# Patient Record
Sex: Female | Born: 1980 | Hispanic: No | Marital: Married | Smoking: Former smoker
Health system: Southern US, Community
[De-identification: ages and names within clinical notes are randomized; demographics above are authoritative.]

## PROBLEM LIST (undated history)

## (undated) DIAGNOSIS — G43909 Migraine, unspecified, not intractable, without status migrainosus: Secondary | ICD-10-CM

## (undated) DIAGNOSIS — F419 Anxiety disorder, unspecified: Secondary | ICD-10-CM

## (undated) DIAGNOSIS — R000 Tachycardia, unspecified: Secondary | ICD-10-CM

## (undated) DIAGNOSIS — I10 Essential (primary) hypertension: Secondary | ICD-10-CM

## (undated) DIAGNOSIS — G629 Polyneuropathy, unspecified: Secondary | ICD-10-CM

## (undated) DIAGNOSIS — E1140 Type 2 diabetes mellitus with diabetic neuropathy, unspecified: Secondary | ICD-10-CM

## (undated) DIAGNOSIS — E119 Type 2 diabetes mellitus without complications: Secondary | ICD-10-CM

## (undated) DIAGNOSIS — K219 Gastro-esophageal reflux disease without esophagitis: Secondary | ICD-10-CM

## (undated) DIAGNOSIS — D649 Anemia, unspecified: Secondary | ICD-10-CM

## (undated) DIAGNOSIS — K8020 Calculus of gallbladder without cholecystitis without obstruction: Secondary | ICD-10-CM

## (undated) HISTORY — DX: Type 2 diabetes mellitus with diabetic neuropathy, unspecified: E11.40

## (undated) HISTORY — PX: NO PAST SURGERIES: SHX2092

---
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
## 2012-08-23 DIAGNOSIS — E119 Type 2 diabetes mellitus without complications: Secondary | ICD-10-CM

## 2012-08-23 HISTORY — DX: Type 2 diabetes mellitus without complications: E11.9

## 2015-12-28 ENCOUNTER — Emergency Department (HOSPITAL_COMMUNITY)
Admission: EM | Admit: 2015-12-28 | Discharge: 2015-12-28 | Disposition: A | Discharge status: Home / Self Care | Payer: Self-pay | Attending: Emergency Medicine | Admitting: Emergency Medicine

## 2015-12-28 ENCOUNTER — Encounter (HOSPITAL_COMMUNITY): Payer: Self-pay | Admitting: *Deleted

## 2015-12-28 DIAGNOSIS — R0981 Nasal congestion: Secondary | ICD-10-CM

## 2015-12-28 DIAGNOSIS — J029 Acute pharyngitis, unspecified: Secondary | ICD-10-CM | POA: Insufficient documentation

## 2015-12-28 DIAGNOSIS — R51 Headache: Secondary | ICD-10-CM | POA: Insufficient documentation

## 2015-12-28 DIAGNOSIS — R509 Fever, unspecified: Secondary | ICD-10-CM | POA: Insufficient documentation

## 2015-12-28 DIAGNOSIS — H748X1 Other specified disorders of right middle ear and mastoid: Secondary | ICD-10-CM | POA: Insufficient documentation

## 2015-12-28 DIAGNOSIS — R110 Nausea: Secondary | ICD-10-CM | POA: Insufficient documentation

## 2015-12-28 DIAGNOSIS — H9201 Otalgia, right ear: Secondary | ICD-10-CM | POA: Insufficient documentation

## 2015-12-28 LAB — RAPID STREP SCREEN (MED CTR MEBANE ONLY): STREPTOCOCCUS, GROUP A SCREEN (DIRECT): NEGATIVE

## 2015-12-28 MED ORDER — OXYMETAZOLINE HCL 0.05 % NA SOLN: 1.0000 | Freq: Two times a day (BID) | NASAL | Status: DC

## 2015-12-28 MED ORDER — HYDROCODONE-ACETAMINOPHEN 7.5-325 MG/15ML PO SOLN
15.0000 mL | Freq: Three times a day (TID) | ORAL | Status: DC | PRN
Start: 2015-12-28 — End: 2019-03-13

## 2015-12-28 MED ORDER — ACETAMINOPHEN 500 MG PO TABS
1000.0000 mg | ORAL_TABLET | Freq: Once | ORAL | Status: AC
  Administered 2015-12-28: 1000 mg via ORAL
  Filled 2015-12-28: qty 2

## 2015-12-28 MED ORDER — ACYCLOVIR 400 MG PO TABS: 400.0000 mg | ORAL_TABLET | Freq: Every day | ORAL | Status: DC

## 2015-12-28 NOTE — ED Provider Notes (Signed)
CSN: [id number]     Arrival date & time 12/28/15  [id number] History  By signing my name below, I, Tanda RockersMargaux Venter, attest that this documentation has been prepared under the direction and in the presence of Melburn HakeNicole Aleric Froelich, PA-C. Electronically Signed: Tanda RockersMargaux Venter, ED Scribe. 12/28/2015. 9:20 AM.   Chief Complaint  Patient presents with  . Otalgia  . Sore Throat   The history is provided by the patient. No language interpreter was used.     HPI Comments: Maria Morris is a 35 y.o. female who presents to the Emergency Department complaining of gradual onset, constant, sharp, right ear pain x 3 days, worsening today. No recent trauma to ear. Pt also complains of a right sided sore throat, watery eyes, subjective fever, intermittent nausea (onset this morning), rhinorrhea, and a mild headache. She has pain with swallowing but is able to tolerate her own secretions. Pt has been taking Sudafed and Advil with mild relief. Her last dose of Advil was last night. No recent sick contact with similar symptoms. Denies visual changes, eye drainage, nasal congestion, drooling, facial swelling, shortness of breath, cough, wheezing, vomiting, abdominal pain, hearing loss, ear drainage, chest pain, rash or any other associated symptoms.  History reviewed. No pertinent past medical history. History reviewed. No pertinent past surgical history. History reviewed. No pertinent family history. Social History  Substance Use Topics  . Smoking status: Never Smoker   . Smokeless tobacco: Never Used  . Alcohol Use: Yes   OB History    No data available     Review of Systems  Constitutional: Positive for fever (subjective).  HENT: Positive for ear pain (right), rhinorrhea and sore throat. Negative for congestion, drooling, ear discharge, facial swelling, hearing loss and trouble swallowing.   Eyes: Negative for discharge (watering).  Respiratory: Negative for cough, shortness of breath and wheezing.    Cardiovascular: Negative for chest pain.  Gastrointestinal: Positive for nausea. Negative for vomiting and abdominal pain.  Neurological: Positive for headaches.   Allergies  Review of patient's allergies indicates no known allergies.  Home Medications   Prior to Admission medications   Medication Sig Start Date End Date Taking? Authorizing Provider  acyclovir (ZOVIRAX) 400 MG tablet Take 1 tablet (400 mg total) by mouth 5 (five) times daily. 12/28/15   Barrett HenleNicole Elizabeth Poseidon Pam, PA-C  HYDROcodone-acetaminophen (HYCET) 7.5-325 mg/15 ml solution Take 15 mLs by mouth every 8 (eight) hours as needed for moderate pain. 12/28/15   Barrett HenleNicole Elizabeth Keelyn Monjaras, PA-C  oxymetazoline (AFRIN NASAL SPRAY) 0.05 % nasal spray Place 1 spray into both nostrils 2 (two) times daily. 12/28/15   Satira SarkNicole Elizabeth Rhyse Skowron, PA-C   BP 147/95 mmHg  Pulse 89  Temp(Src) 98.8 F (37.1 C) (Oral)  Resp 16  SpO2 100%  LMP 12/27/2009   Physical Exam  Constitutional: She is oriented to person, place, and time. She appears well-developed and well-nourished.  HENT:  Head: Normocephalic and atraumatic.  Right Ear: Hearing, external ear and ear canal normal. No mastoid tenderness. Tympanic membrane is not erythematous. A middle ear effusion is present.  Left Ear: Hearing, tympanic membrane, external ear and ear canal normal. No mastoid tenderness.  Nose: Rhinorrhea present. Right sinus exhibits no maxillary sinus tenderness and no frontal sinus tenderness. Left sinus exhibits no maxillary sinus tenderness and no frontal sinus tenderness.  Mouth/Throat: Uvula is midline and mucous membranes are normal. No trismus in the jaw. No dental abscesses or uvula swelling. Oropharyngeal exudate (white) and posterior oropharyngeal erythema present. No posterior  oropharyngeal edema or tonsillar abscesses.  Multiple erosions and vesicular ulcerated lesions noted to posterior oropharynx and right lateral tongue.  Eyes: Conjunctivae and EOM are  normal. Pupils are equal, round, and reactive to light. Right eye exhibits no discharge. Left eye exhibits no discharge. No scleral icterus.  Neck: Normal range of motion. Neck supple.  Cardiovascular: Normal rate, regular rhythm, normal heart sounds and intact distal pulses.   Pulmonary/Chest: Effort normal and breath sounds normal. No respiratory distress. She has no wheezes. She has no rales.  Abdominal: Soft. Bowel sounds are normal. There is no tenderness.  Musculoskeletal: She exhibits no edema.  Lymphadenopathy:    She has cervical adenopathy (right submandibular).  Neurological: She is alert and oriented to person, place, and time.  Skin: Skin is warm and dry.  Nursing note and vitals reviewed.   ED Course  Procedures (including critical care time)  DIAGNOSTIC STUDIES: Oxygen Saturation is 98% on RA, normal by my interpretation.    COORDINATION OF CARE: 9:06 AM-Discussed treatment plan which includes rapid strep test with pt at bedside and pt agreed to plan.   Labs Review Labs Reviewed  RAPID STREP SCREEN (NOT AT [facility])  CULTURE, GROUP A STREP [facility])    Imaging Review No results found. I have personally reviewed and evaluated these lab results as part of my medical decision-making.   EKG Interpretation None      MDM   Final diagnoses:  Sore throat  Nasal congestion  Ear pain, right   Patient presents with sore throat, ear pain, rhinorrhea and subjective fever. Denies any known sick contacts. VSS. Exam revealed right middle ear effusion, rhinorrhea, white exudate on posterior oropharynx and multiple erosions and erythematous vesicular ulcerated lesions to posterior oral pharynx and tongue. Presentation non concerning for PTA or infxn spread to soft tissue. No trismus or uvula deviation. Strep negative. Lesions consistent with herpes simplex virus. I also suspect pt has a concurrent viral sinusitis. Discussed results and treatment plan with patient. Patient discharged  home with prescription for acyclovir, pain medicine and decongestion. Patient given information to schedule a follow-up appointment with a [facility]. Discussed return precautions with patient.  I personally performed the services described in this documentation, which was scribed in my presence. The recorded information has been reviewed and is accurate.      Satira Sark [place], [place] 12/28/15 1018  Pricilla Loveless, MD 12/31/15 [phone]

## 2015-12-28 NOTE — ED Notes (Signed)
Declined W/C at D/C and was escorted to lobby by RN. 

## 2015-12-28 NOTE — Discharge Instructions (Signed)
Take your medications as prescribed. Refrain from participating in any sexual activities for the next 2-3 weeks until your lesions have completely resolved. I recommend using condoms and other forms of protection on being sexually active. Continue to drink at least six 8 ounce glasses of water daily to remain hydrated. Please follow up with a primary care provider from the Resource Guide provided below in 1 week as needed. Please return to the Emergency Department if symptoms worsen or new onset of fever, headache, difficulty swallowing resulting in drooling, facial/neck swelling, difficulty breathing, wheezing, chest pain, unable to tolerate fluids.

## 2015-12-28 NOTE — ED Notes (Signed)
PT reports the RT ear started to hurt and throat on Thursday.

## 2015-12-29 LAB — CULTURE, GROUP A STREP (THRC)

## 2016-09-05 ENCOUNTER — Emergency Department (HOSPITAL_COMMUNITY)
Admission: EM | Admit: 2016-09-05 | Discharge: 2016-09-05 | Disposition: A | Discharge status: Home / Self Care | Payer: Self-pay | Attending: Emergency Medicine | Admitting: Emergency Medicine

## 2016-09-05 ENCOUNTER — Encounter (HOSPITAL_COMMUNITY): Payer: Self-pay | Admitting: Emergency Medicine

## 2016-09-05 ENCOUNTER — Emergency Department (HOSPITAL_COMMUNITY): Payer: Self-pay

## 2016-09-05 DIAGNOSIS — H748X3 Other specified disorders of middle ear and mastoid, bilateral: Secondary | ICD-10-CM | POA: Insufficient documentation

## 2016-09-05 DIAGNOSIS — J069 Acute upper respiratory infection, unspecified: Secondary | ICD-10-CM | POA: Insufficient documentation

## 2016-09-05 DIAGNOSIS — H6593 Unspecified nonsuppurative otitis media, bilateral: Secondary | ICD-10-CM

## 2016-09-05 DIAGNOSIS — B9789 Other viral agents as the cause of diseases classified elsewhere: Secondary | ICD-10-CM

## 2016-09-05 DIAGNOSIS — R42 Dizziness and giddiness: Secondary | ICD-10-CM | POA: Insufficient documentation

## 2016-09-05 HISTORY — DX: Anemia, unspecified: D64.9

## 2016-09-05 LAB — CBC
HCT: 38.2 % (ref 36.0–46.0)
HEMOGLOBIN: 12.3 g/dL (ref 12.0–15.0)
MCH: 27.8 pg (ref 26.0–34.0)
MCHC: 32.2 g/dL (ref 30.0–36.0)
MCV: 86.4 fL (ref 78.0–100.0)
Platelets: 259 10*3/uL (ref 150–400)
RBC: 4.42 MIL/uL (ref 3.87–5.11)
RDW: 14.3 % (ref 11.5–15.5)
WBC: 10 10*3/uL (ref 4.0–10.5)

## 2016-09-05 LAB — COMPREHENSIVE METABOLIC PANEL
ALBUMIN: 3.3 g/dL — AB (ref 3.5–5.0)
ALK PHOS: 62 U/L (ref 38–126)
ALT: 16 U/L (ref 14–54)
AST: 19 U/L (ref 15–41)
Anion gap: 9 (ref 5–15)
BILIRUBIN TOTAL: 0.8 mg/dL (ref 0.3–1.2)
BUN: 10 mg/dL (ref 6–20)
CALCIUM: 9.2 mg/dL (ref 8.9–10.3)
CO2: 26 mmol/L (ref 22–32)
Chloride: 105 mmol/L (ref 101–111)
Creatinine, Ser: 0.68 mg/dL (ref 0.44–1.00)
GFR calc Af Amer: >60 mL/min (ref >60)
GFR calc non Af Amer: >60 mL/min (ref >60)
GLUCOSE: 166 mg/dL — AB (ref 65–99)
Potassium: 4 mmol/L (ref 3.5–5.1)
SODIUM: 140 mmol/L (ref 135–145)
TOTAL PROTEIN: 7.1 g/dL (ref 6.5–8.1)

## 2016-09-05 LAB — URINALYSIS, ROUTINE W REFLEX MICROSCOPIC
BILIRUBIN URINE: NEGATIVE
Glucose, UA: 50 mg/dL — AB
HGB URINE DIPSTICK: NEGATIVE
KETONES UR: NEGATIVE mg/dL
Leukocytes, UA: NEGATIVE
NITRITE: NEGATIVE
PH: 6 (ref 5.0–8.0)
Protein, ur: NEGATIVE mg/dL
SPECIFIC GRAVITY, URINE: 1.018 (ref 1.005–1.030)

## 2016-09-05 LAB — LIPASE, BLOOD: Lipase: 29 U/L (ref 11–51)

## 2016-09-05 LAB — I-STAT TROPONIN, ED: Troponin i, poc: 0 ng/mL (ref 0.00–0.08)

## 2016-09-05 LAB — I-STAT BETA HCG BLOOD, ED (MC, WL, AP ONLY): I-stat hCG, quantitative: <5 m[IU]/mL (ref <5)

## 2016-09-05 IMAGING — DX DG CHEST 2V
2 series · 2 of 2 positions shown · non-contrast
Comparison: None.

CLINICAL DATA: Productive cough with dizziness and congestion for
3-4 days. Vomiting, chills and fever for 2 days.

EXAM:
CHEST  2 VIEW

[chest lat]
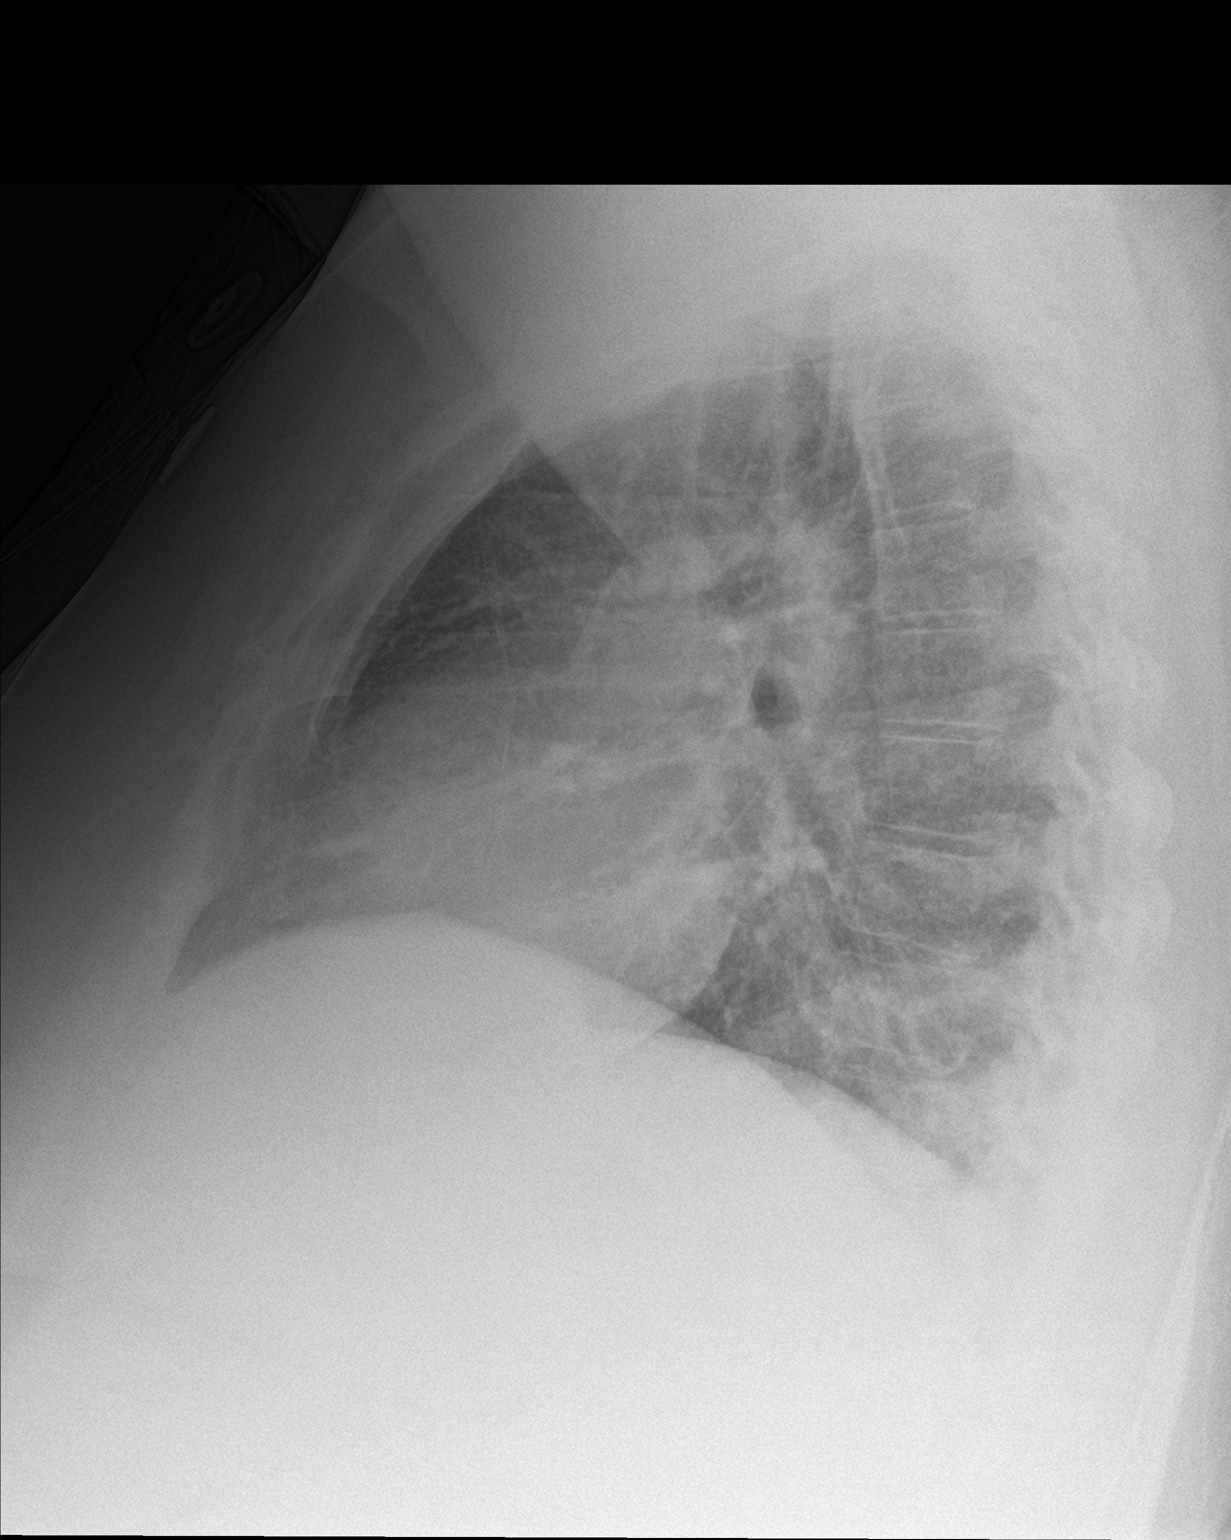

[chest ap]
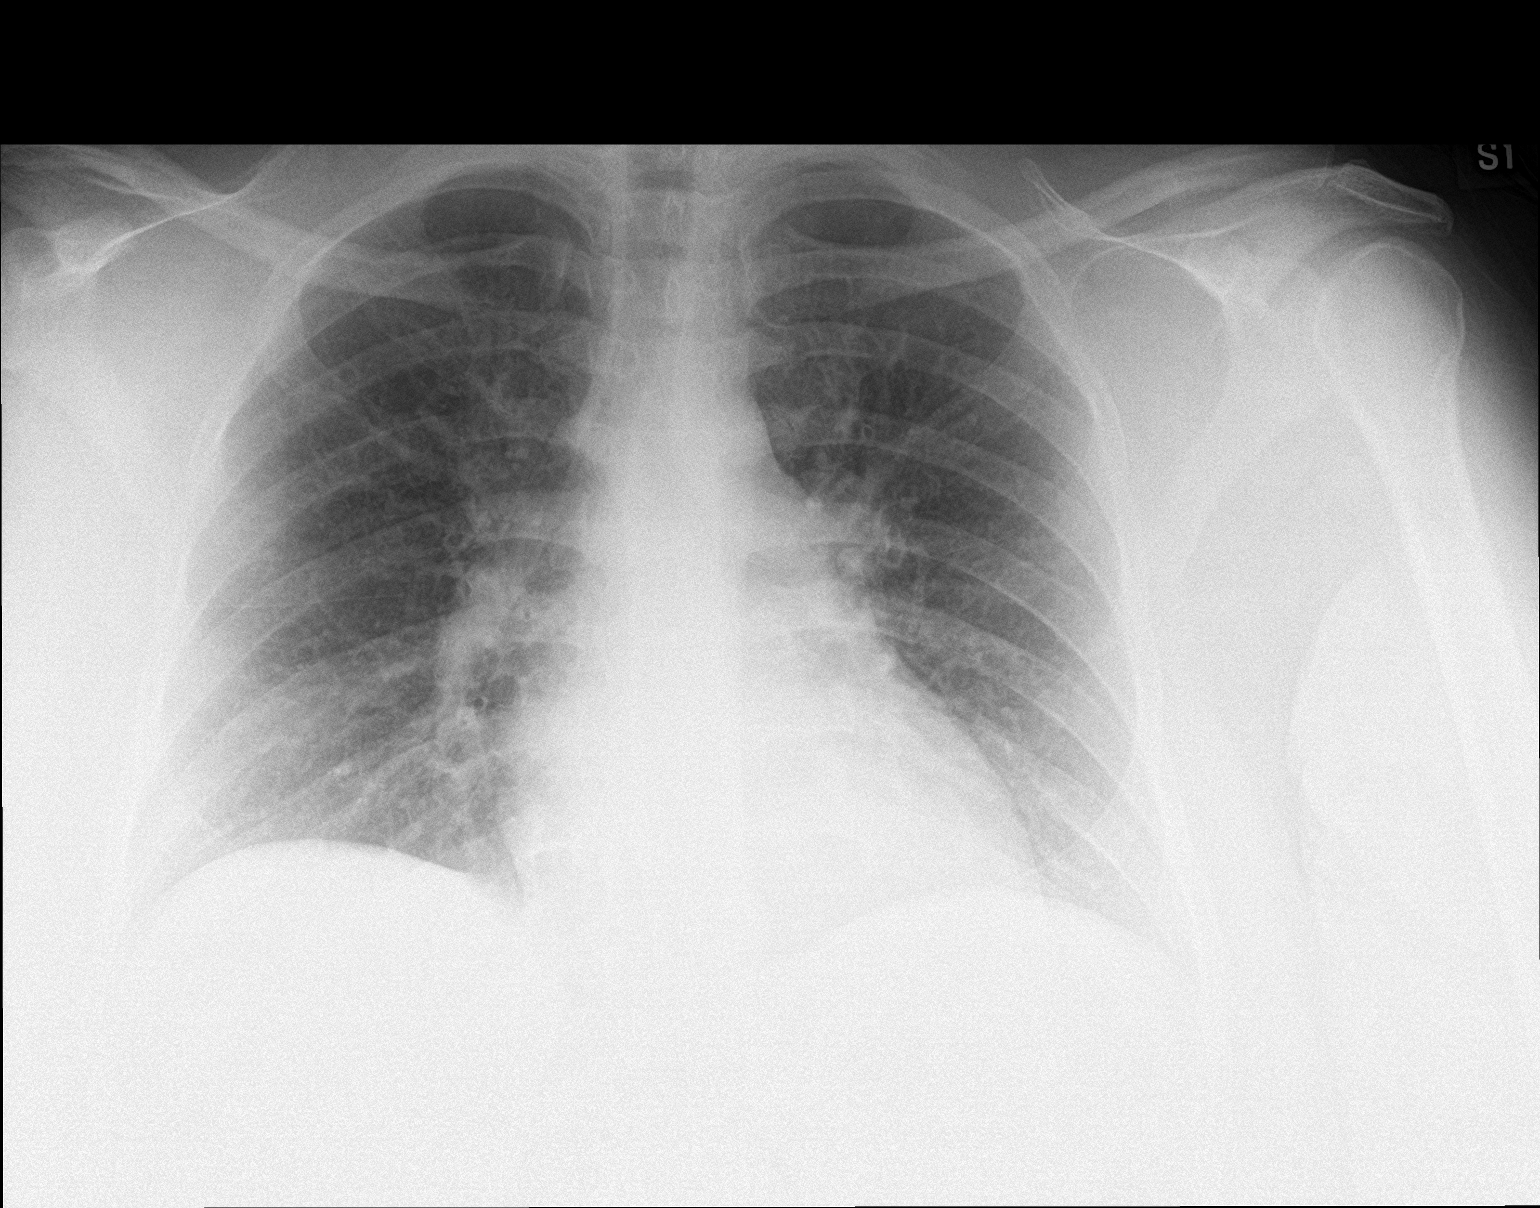

[2 of 2 positions shown; findings below may reference images not displayed]

FINDINGS: Body habitus reduces diagnostic sensitivity and specificity.

The lungs appear clear.  Cardiac and mediastinal contours normal.

No pleural effusion identified.
IMPRESSION: No active cardiopulmonary disease.

## 2016-09-05 MED ORDER — BENZONATATE 100 MG PO CAPS: 100.0000 mg | ORAL_CAPSULE | Freq: Three times a day (TID) | ORAL | 0 refills | Status: DC

## 2016-09-05 MED ORDER — SODIUM CHLORIDE 0.9 % IV BOLUS (SEPSIS)
1000.0000 mL | Freq: Once | INTRAVENOUS | Status: AC
  Administered 2016-09-05: 1000 mL via INTRAVENOUS

## 2016-09-05 NOTE — ED Provider Notes (Signed)
[facility]-EMERGENCY DEPT Provider Note   CSN: [id number] Arrival date & time: 09/05/16  1241     History   Chief Complaint Chief Complaint  Patient presents with  . Cough  . Nausea  . Dizziness    HPI Maria Morris is a 36 y.o. female.  The history is provided by the patient and medical records.  Cough   Dizziness  Associated symptoms: nausea     36 year old female here with cough, nausea, and intermittent dizziness/lightheadedness for the past few days. Patient reports her fianc was sick last week with URI type symptoms and she developed them a few days ago. States cough is intermittently productive with thick mucus, other times it is dry. She has had some nausea but denies vomiting. No diarrhea. States today at work she started experiencing some lightheadedness, usually when standing up or changing positions so she left work early and came here to be evaluated. She denies any chest pain or shortness of breath. No issues with dizziness in the past. No syncopal events. No numbness, weakness, confusion, difficulty walking, blurred vision, headache, or neck pain. She denies any fever or chills. She has been taking Mucinex at home to help break up the congestion without any improvement of her symptoms.  Past Medical History:  Diagnosis Date  . Anemia     There are no active problems to display for this patient.   History reviewed. No pertinent surgical history.  OB History    No data available       Home Medications    Prior to Admission medications   Medication Sig Start Date End Date Taking? Authorizing Provider  acyclovir (ZOVIRAX) 400 MG tablet Take 1 tablet (400 mg total) by mouth 5 (five) times daily. 12/28/15   Barrett Henle, PA-C  HYDROcodone-acetaminophen (HYCET) 7.5-325 mg/15 ml solution Take 15 mLs by mouth every 8 (eight) hours as needed for moderate pain. 12/28/15   Barrett Henle, PA-C  oxymetazoline (AFRIN NASAL SPRAY) 0.05 % nasal spray  Place 1 spray into both nostrils 2 (two) times daily. 12/28/15   Barrett Henle, PA-C    Family History No family history on file.  Social History Social History  Substance Use Topics  . Smoking status: Never Smoker  . Smokeless tobacco: Never Used  . Alcohol use Yes     Allergies   Patient has no known allergies.   Review of Systems Review of Systems  Respiratory: Positive for cough.   Gastrointestinal: Positive for nausea.  Neurological: Positive for dizziness and light-headedness.  All other systems reviewed and are negative.    Physical Exam Updated Vital Signs BP 133/74 (BP Location: Left Arm)   Pulse 76   Temp 98.1 F (36.7 C) (Oral)   Resp 18   Ht 5\' 7"  (1.702 m)   Wt 131.1 kg   LMP 09/01/2016   SpO2 99%   BMI 45.26 kg/m   Physical Exam  Constitutional: She is oriented to person, place, and time. She appears well-developed and well-nourished.  HENT:  Head: Normocephalic and atraumatic.  Nose: Nose normal.  Mouth/Throat: Uvula is midline, oropharynx is clear and moist and mucous membranes are normal.  Middle ear effusions noted bilaterally; TM's are not erythematous, canals appear normal  Eyes: Conjunctivae and EOM are normal. Pupils are equal, round, and reactive to light.  Neck: Normal range of motion.  No rigidity, full ROM  Cardiovascular: Normal rate, regular rhythm and normal heart sounds.   Pulmonary/Chest: Effort normal and breath sounds  normal.  Abdominal: Soft. Bowel sounds are normal.  Musculoskeletal: Normal range of motion.  Neurological: She is alert and oriented to person, place, and time.  AAOx3, answering questions and following commands appropriately; equal strength UE and LE bilaterally; CN grossly intact; moves all extremities appropriately without ataxia; no focal neuro deficits or facial asymmetry appreciated  Skin: Skin is warm and dry.  Psychiatric: She has a normal mood and affect.  Nursing note and vitals  reviewed.    ED Treatments / Results  Labs (all labs ordered are listed, but only abnormal results are displayed) Labs Reviewed  COMPREHENSIVE METABOLIC PANEL - Abnormal; Notable for the following:       Result Value   Glucose, Bld 166 (*)    Albumin 3.3 (*)    All other components within normal limits  URINALYSIS, ROUTINE W REFLEX MICROSCOPIC - Abnormal; Notable for the following:    Glucose, UA 50 (*)    All other components within normal limits  LIPASE, BLOOD  CBC  I-STAT BETA HCG BLOOD, ED ([facility], [facility], [facility] ONLY)  I-STAT TROPOININ, ED    EKG  EKG Interpretation None       Radiology Dg Chest 2 View  Result Date: 09/05/2016 CLINICAL DATA:  Productive cough with dizziness and congestion for 3-4 days. Vomiting, chills and fever for 2 days. EXAM: CHEST  2 VIEW COMPARISON:  None. FINDINGS: Body habitus reduces diagnostic sensitivity and specificity. The lungs appear clear.  Cardiac and mediastinal contours normal. No pleural effusion identified. IMPRESSION: No active cardiopulmonary disease. Electronically Signed   By: Gaylyn Rong M.D.   On: 09/05/2016 16:22    Procedures Procedures (including critical care time)  Medications Ordered in ED Medications  sodium chloride 0.9 % bolus 1,000 mL (1,000 mLs Intravenous New Bag/Given 09/05/16 1645)     Initial Impression / Assessment and Plan / ED Course  I have reviewed the triage vital signs and the nursing notes.  Pertinent labs & imaging results that were available during my care of the patient were reviewed by me and considered in my medical decision making (see chart for details).  Clinical Course    36 y.o. F here with URI symptoms and dizziness/lightheadedness.  Fiance recently sick with similar symptoms.  She is afebrile and nontoxic. Her neurologic exam is nonfocal. Labwork is overall reassuring. Troponin is negative. EKG without any acute ischemic changes. Chest x-ray is clear. After treatment with IV fluids,  patient reports she is doing better. She has been up and ambulatory here in the ED without issue or recurrent lightheadedness.  She remains neurologically intact. Suspect her lightheadedness may be some from mild dehydration as she has had poor oral intake recently. Patient also has middle ear effusions which may be contributing as well. I feel that she is stable for discharge with supportive care. I recommended that she follow-up closely with her primary care doctor should symptoms persist.  Discussed plan with patient, she acknowledged understanding and agreed with plan of care.  Return precautions given for new or worsening symptoms.  Final Clinical Impressions(s) / ED Diagnoses   Final diagnoses:  Viral URI with cough  Lightheadedness  Middle ear effusion, bilateral    New Prescriptions Discharge Medication List as of 09/05/2016  6:01 PM    START taking these medications   Details  benzonatate (TESSALON) 100 MG capsule Take 1 capsule (100 mg total) by mouth every 8 (eight) hours., Starting Sun 09/05/2016, Print  Garlon HatchetLisa M Sanders, PA-C 09/05/16 1855    Alvira MondayErin Schlossman, MD 09/08/16 [phone]

## 2016-09-05 NOTE — Discharge Instructions (Signed)
Take the prescribed medication as directed.  This will help with cough.  You may continue mucinex or other over the counter medications for congestion. Follow-up with your primary care doctor if symptoms persist. Return to the ED for new or worsening symptoms.

## 2016-09-05 NOTE — ED Triage Notes (Signed)
Pt c/o cough, N/V and dizziness ongoing for a couple of days. Pt has tried mucinex without relief.

## 2017-03-12 ENCOUNTER — Emergency Department (HOSPITAL_COMMUNITY)
Admission: EM | Admit: 2017-03-12 | Discharge: 2017-03-12 | Disposition: A | Discharge status: Home / Self Care | Payer: Self-pay | Attending: Emergency Medicine | Admitting: Emergency Medicine

## 2017-03-12 ENCOUNTER — Encounter (HOSPITAL_COMMUNITY): Payer: Self-pay | Admitting: Emergency Medicine

## 2017-03-12 DIAGNOSIS — Y998 Other external cause status: Secondary | ICD-10-CM | POA: Insufficient documentation

## 2017-03-12 DIAGNOSIS — Y939 Activity, unspecified: Secondary | ICD-10-CM | POA: Insufficient documentation

## 2017-03-12 DIAGNOSIS — S0003XA Contusion of scalp, initial encounter: Secondary | ICD-10-CM | POA: Insufficient documentation

## 2017-03-12 DIAGNOSIS — W2209XA Striking against other stationary object, initial encounter: Secondary | ICD-10-CM | POA: Insufficient documentation

## 2017-03-12 DIAGNOSIS — S0990XA Unspecified injury of head, initial encounter: Secondary | ICD-10-CM

## 2017-03-12 DIAGNOSIS — Y929 Unspecified place or not applicable: Secondary | ICD-10-CM | POA: Insufficient documentation

## 2017-03-12 MED ORDER — ACETAMINOPHEN 500 MG PO TABS: 1000.0000 mg | ORAL_TABLET | Freq: Once | ORAL | Status: DC

## 2017-03-12 NOTE — Discharge Instructions (Signed)
You may take tylenol or ibuprofen as prescribed over-the-counter as needed for pain relief. I also recommend applying ice to affected area for 15-20 minutes 3-4 times daily. Please follow up with a primary care provider from the Resource Guide provided below in 1 week as needed. Please return to the Emergency Department if symptoms worsen or new onset of fever, neck stiffness, dizziness, visual changes, altered mental status, confusion, somnolence, numbness, weakness, vomiting.

## 2017-03-12 NOTE — ED Provider Notes (Signed)
[facility]PT Provider Note   CSN: [id number] Arrival date & time: 03/12/17  1555  By signing my name below, I, Ny'Kea Lewis, attest that this documentation has been prepared under the direction and in the presence of Melburn Hake, PA-C.  Electronically Signed: Karren Cobble, ED Scribe. 03/12/17. 5:48 PM.  History   Chief Complaint Chief Complaint  Patient presents with  . Head Injury   The history is provided by the patient. No language interpreter was used.   HPI  HPI Comments: Maria Morris is a 36 y.o. female with a history of anemia,  who presents to the Emergency Department complaining of sudden onset, gradually worsening, persistent pain to the back of her head that began around 1:30pm, after hitting her head on the corner of a metal box. Pt notes associated mild nausea and lightheadedness after the initial head injury which have since resolved. 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 Denies loss of consciousness. Denies bleeding to the area.She is ambulatory without assistance since the incident. She is not currently on anticoagulants. No treatment tried PTA. Denies fever, visual disturbance, vomiting, back pain, numbness, weakness, or tingling of her extremities. She reports feeling slightly more tired since the incident occurred. Denies taking any meds PTA.   Past Medical History:  Diagnosis Date  . Anemia    There are no active problems to display for this patient.  History reviewed. No pertinent surgical history.  OB History    No data available     Home Medications    Prior to Admission medications   Medication Sig Start Date End Date Taking? Authorizing Provider  acetaminophen (TYLENOL) 500 MG tablet Take 1,000-2,000 mg by mouth 3 (three) times daily as needed for headache (migraine).    [provider]  aspirin-acetaminophen-caffeine (EXCEDRIN MIGRAINE) [phone] MG tablet Take 2  tablets by mouth 2 (two) times daily as needed for headache or migraine.    [provider]  benzonatate (TESSALON) 100 MG capsule Take 1 capsule (100 mg total) by mouth every 8 (eight) hours. 09/05/16   Garlon Hatchet, PA-C  GuaiFENesin (MUCINEX PO) Take by mouth every 4 (four) hours as needed (cough).    [provider]  HYDROcodone-acetaminophen (HYCET) 7.5-325 mg/15 ml solution Take 15 mLs by mouth every 8 (eight) hours as needed for moderate pain. Patient not taking: Reported on 09/05/2016 12/28/15   Barrett Henle, PA-C  OVER THE COUNTER MEDICATION Place 1 drop into both eyes 2 (two) times daily as needed (dry eyes). Over the counter lubricating eye drops    [provider]  oxymetazoline (AFRIN NASAL SPRAY) 0.05 % nasal spray Place 1 spray into both nostrils 2 (two) times daily. Patient not taking: Reported on 09/05/2016 12/28/15   Barrett Henle, PA-C    Family History No family history on file.  Social History Social History  Substance Use Topics  . Smoking status: Never Smoker  . Smokeless tobacco: Never Used  . Alcohol use Yes   Allergies   Patient has no known allergies.   Review of Systems Review of Systems  Eyes: Negative for visual disturbance.  Gastrointestinal: Positive for nausea. Negative for vomiting.  Musculoskeletal: Positive for neck pain. Negative for back pain.  Neurological: Positive for light-headedness. Negative for weakness and numbness.       Denies tingling of the extremities.   All other systems reviewed and are negative.  Physical  Exam Updated Vital Signs BP (!) 137/92 (BP Location: Left Arm)   Pulse 92   Temp 98.4 F (36.9 C) (Oral)   Resp 18   Ht 5\' 8"  (1.727 m)   Wt 132.9 kg (293 lb)   LMP 02/14/2017   SpO2 97%   BMI 44.55 kg/m   Physical Exam  Constitutional: She is oriented to person, place, and time. She appears well-developed and well-nourished. No distress.  HENT:  Head: Normocephalic  and atraumatic. Head is without raccoon's eyes, without Battle's sign, without abrasion and without laceration.  Right Ear: Tympanic membrane normal. No hemotympanum.  Left Ear: Tympanic membrane normal. No hemotympanum.  Nose: Nose normal. No sinus tenderness, nasal deformity, septal deviation or nasal septal hematoma. No epistaxis.  Mouth/Throat: Uvula is midline, oropharynx is clear and moist and mucous membranes are normal. No oropharyngeal exudate, posterior oropharyngeal edema, posterior oropharyngeal erythema or tonsillar abscesses.  Small abrasion and hematoma present to the posterior aspect of the partial scalp, no laceration or active bleeding, mild tenderness TTP.   Eyes: Pupils are equal, round, and reactive to light. Conjunctivae and EOM are normal. Right eye exhibits no discharge. Left eye exhibits no discharge. No scleral icterus.  Neck: Normal range of motion. Neck supple.  Cardiovascular: Normal rate, regular rhythm, normal heart sounds and intact distal pulses.   Pulmonary/Chest: Effort normal and breath sounds normal.  Abdominal: Soft. She exhibits no distension. There is no tenderness.  Musculoskeletal: Normal range of motion. She exhibits no edema or deformity.  No midline C, T, or L tenderness. Full range of motion of neck and back. Full range of motion of bilateral upper and lower extremities, with 5/5 strength. Sensation intact. 2+ radial and PT pulses. Cap refill <2 seconds. Patient able to stand and ambulate without assistance.    Neurological: She is alert and oriented to person, place, and time. She has normal strength. No cranial nerve deficit or sensory deficit. Coordination and gait normal.  Skin: Skin is warm and dry. She is not diaphoretic.  Nursing note and vitals reviewed.  ED Treatments / Results  DIAGNOSTIC STUDIES: Oxygen Saturation is 97% on RA, adequate by my interpretation.   COORDINATION OF CARE: 5:47 PM-Discussed next steps with pt. Pt verbalized  understanding and is agreeable with the plan.   Labs (all labs ordered are listed, but only abnormal results are displayed) Labs Reviewed - No data to display  EKG  EKG Interpretation None       Radiology No results found.  Procedures Procedures (including critical care time)  Medications Ordered in ED Medications  acetaminophen (TYLENOL) tablet 1,000 mg (1,000 mg Oral Not Given 03/12/17 1801)     Initial Impression / Assessment and Plan / ED Course  I have reviewed the triage vital signs and the nursing notes.  Pertinent labs & imaging results that were available during my care of the patient were reviewed by me and considered in my medical decision making (see chart for details).     Patient presents with pain to the back of her head after reported head injury that occurred earlier today while at work. Denies LOC. Denies fever, numbness, weakness, vomiting. VSS. Exam revealed small abrasion and contusion present to posterior scalp, mild tenderness to palpation, no wound or active bleeding present. Remaining exam unremarkable without any evidence of other injuries. No neuro deficits. Patient given Tylenol in the ED with improvement of symptoms. Patient's symptoms appear consistent with head contusion with possible mild contusion; I do not  feel that any further workup/imaging is warranted at this time. Plan to discharge patient home with symptomatic treatment and concussion precautions. Discussed strict return precautions.  Final Clinical Impressions(s) / ED Diagnoses   Final diagnoses:  Injury of head, initial encounter  Contusion of scalp, initial encounter    New Prescriptions Discharge Medication List as of 03/12/2017  5:53 PM    I personally performed the services described in this documentation, which was scribed in my presence. The recorded information has been reviewed and is accurate.     Barrett Henleadeau, Vicy Medico Elizabeth, PA-C 03/12/17 1840    Jerelyn ScottLinker, Martha,  MD 03/12/17 [phone]

## 2017-03-12 NOTE — ED Triage Notes (Signed)
Called to Pt room. Pt family member reported they need to hurry up because we are hungry and want to go home.

## 2017-03-12 NOTE — ED Notes (Signed)
Declined W/C at D/C and was escorted to lobby by RN. 

## 2017-03-12 NOTE — ED Triage Notes (Addendum)
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 Pt denies + LOC. Pt reports that she donated plasma just prior to coming to ED.

## 2017-07-20 ENCOUNTER — Emergency Department (HOSPITAL_COMMUNITY)
Admission: EM | Admit: 2017-07-20 | Discharge: 2017-07-20 | Disposition: A | Discharge status: Home / Self Care | Payer: Self-pay | Attending: Emergency Medicine | Admitting: Emergency Medicine

## 2017-07-20 ENCOUNTER — Other Ambulatory Visit: Payer: Self-pay

## 2017-07-20 ENCOUNTER — Encounter (HOSPITAL_COMMUNITY): Payer: Self-pay | Admitting: Emergency Medicine

## 2017-07-20 DIAGNOSIS — M25511 Pain in right shoulder: Secondary | ICD-10-CM | POA: Insufficient documentation

## 2017-07-20 DIAGNOSIS — G8929 Other chronic pain: Secondary | ICD-10-CM | POA: Insufficient documentation

## 2017-07-20 DIAGNOSIS — K0889 Other specified disorders of teeth and supporting structures: Secondary | ICD-10-CM | POA: Insufficient documentation

## 2017-07-20 LAB — POC URINE PREG, ED: Preg Test, Ur: NEGATIVE

## 2017-07-20 MED ORDER — AMOXICILLIN 500 MG PO CAPS: 500.0000 mg | ORAL_CAPSULE | Freq: Two times a day (BID) | ORAL | 0 refills | Status: DC

## 2017-07-20 MED ORDER — CYCLOBENZAPRINE HCL 10 MG PO TABS
10.0000 mg | ORAL_TABLET | Freq: Two times a day (BID) | ORAL | 0 refills | Status: DC | PRN
Start: 2017-07-20 — End: 2019-03-13

## 2017-07-20 NOTE — ED Notes (Signed)
Patient given discharge instructions and verbalized understanding.  Patient stable to discharge at this time.  Patient is alert and oriented to baseline.  No distressed noted at this time.  All belongings taken with the patient at discharge.   

## 2017-07-20 NOTE — ED Triage Notes (Signed)
Pt to ER for 3-4 months of right shoulder pain worse with movement and right facial pain onset 2 weeks ago. Pt in NAD. VSS. Pt is a/o x4.

## 2017-07-20 NOTE — ED Provider Notes (Signed)
[facility] Provider Note   CSN: [id number] Arrival date & time: 07/20/17  1432     History   Chief Complaint Chief Complaint  Patient presents with  . Shoulder Pain  . Facial Pain    HPI Maria Morris is a 36 y.o. female who presents with two complaints today.  Patient reports that for the last 3-4 months, she has had intermittent right shoulder pain that is progressively worsened.  Patient reports that she works at [employer] and does a lot of lifting over her head.  She reports that that movement exacerbates her pain.  She intermittently takes Tylenol for pain.  She denies any preceding trauma, injury, fall.  She redness or swelling of the shoulder.  Patient reports that she is continued to be able to do her daily activities and denies any weakness or numbness of the arm.  Patient also reports that for the last 1-2 weeks, she has had some right-sided facial pain.  She reports of the last 1-2 days, she has noticed some swelling to the lower right face.  She denies any overlying warmth or redness.  Patient states that she has not had a fever.  She does report associated dental pain to the right lower side.  Patient denies any neck pain, back pain, numbness/weakness of her arms or legs, chest pain, difficulty breathing, fevers, difficulty swallowing, drooling, difficulty ambulating.  She does not have a primary care doctor or dentist that she follows up with.  The history is provided by the patient.    Past Medical History:  Diagnosis Date  . Anemia     There are no active problems to display for this patient.   History reviewed. No pertinent surgical history.  OB History    No data available       Home Medications    Prior to Admission medications   Medication Sig Start Date End Date Taking? Authorizing Provider  acetaminophen (TYLENOL) 500 MG tablet Take 1,000-2,000 mg by mouth 3 (three) times daily as needed for headache  (migraine).   Yes [provider]  aspirin-acetaminophen-caffeine (EXCEDRIN MIGRAINE) [id number] MG tablet Take 2 tablets by mouth 2 (two) times daily as needed for headache or migraine.   Yes [provider]  GuaiFENesin (MUCINEX PO) Take by mouth every 4 (four) hours as needed (cough).   Yes [provider]  OVER THE COUNTER MEDICATION Place 1 drop into both eyes 2 (two) times daily as needed (dry eyes). Over the counter lubricating eye drops   Yes [provider]  amoxicillin (AMOXIL) 500 MG capsule Take 1 capsule (500 mg total) by mouth 2 (two) times daily. 07/20/17   Maxwell CaulLayden, Lindsey A, PA-C  benzonatate (TESSALON) 100 MG capsule Take 1 capsule (100 mg total) by mouth every 8 (eight) hours. Patient not taking: Reported on 07/20/2017 09/05/16   Garlon HatchetSanders, Lisa M, PA-C  cyclobenzaprine (FLEXERIL) 10 MG tablet Take 1 tablet (10 mg total) by mouth 2 (two) times daily as needed for muscle spasms. 07/20/17   Maxwell CaulLayden, Lindsey A, PA-C  HYDROcodone-acetaminophen (HYCET) 7.5-325 mg/15 ml solution Take 15 mLs by mouth every 8 (eight) hours as needed for moderate pain. Patient not taking: Reported on 07/20/2017 12/28/15   Barrett HenleNadeau, Nicole Elizabeth, PA-C  oxymetazoline [facility](AFRIN NASAL SPRAY) 0.05 % nasal spray Place 1 spray into both nostrils 2 (two) times daily. Patient not taking: Reported on 07/20/2017 12/28/15   Barrett HenleNadeau, Nicole Elizabeth, PA-C    Family History History reviewed. No  pertinent family history.  Social History Social History   Tobacco Use  . Smoking status: Never Smoker  . Smokeless tobacco: Never Used  Substance Use Topics  . Alcohol use: Yes  . Drug use: No     Allergies   Patient has no known allergies.   Review of Systems Review of Systems  Constitutional: Negative for fever.  HENT: Positive for dental problem and facial swelling. Negative for drooling and trouble swallowing.   Respiratory: Negative for shortness of breath.   Cardiovascular:  Negative for chest pain.  Gastrointestinal: Negative for abdominal pain, nausea and vomiting.  Musculoskeletal:       Right shoulder pain  Skin: Negative for color change.  Neurological: Negative for headaches.     Physical Exam Updated Vital Signs BP 118/78 (BP Location: Right Arm)   Pulse 83   Temp 98.6 F (37 C) (Oral)   Resp 18   Ht 5\' 7"  (1.702 m)   Wt 117.9 kg (260 lb)   SpO2 98%   BMI 40.72 kg/m   Physical Exam  Constitutional: She is oriented to person, place, and time. She appears well-developed and well-nourished.  Sitting comfortably on examination table  HENT:  Head: Normocephalic and atraumatic.    Mouth/Throat: Oropharynx is clear and moist and mucous membranes are normal.  Very minimal soft tissue swelling noted to the right lower face.  No overlying warmth, erythema.  No trismus.  Multiple dental caries scattered throughout.  Patient does have some partially cracked teeth both on the right upper and lower side.  No identifiable dental abscess.  No area of fluctuance or mass.   Eyes: Conjunctivae, EOM and lids are normal. Pupils are equal, round, and reactive to light.  Neck: Full passive range of motion without pain.  Full flexion/extension and lateral movement of neck fully intact. No bony midline tenderness. No deformities or crepitus. Positive Spurlings. No neck or facial swelling.   Cardiovascular: Normal rate, regular rhythm, normal heart sounds and normal pulses. Exam reveals no gallop and no friction rub.  No murmur heard. Pulmonary/Chest: Effort normal and breath sounds normal.  Musculoskeletal: Normal range of motion.  Tenderness to palpation to the anterior deltoid.  No overlying warmth, erythema, ecchymosis.  No deformity or crepitus noted.  Bilateral clavicles are symmetric in appearance.  No deformity or crepitus.  Flexion/extension and abduction/abduction of right shoulder intact but with subjective reports of pain.  Positive Neer's impingement,  Hawkins, empty can test on the right.  Negative on the left.  Full range of motion of left shoulder without difficulty.  Lymphadenopathy:    She has no cervical adenopathy.  Neurological: She is alert and oriented to person, place, and time.  Follows commands, Moves all extremities  5/5 strength to BUE Sensation intact throughout all major nerve distributions of the BUE Normal gait  Skin: Skin is warm and dry. Capillary refill takes less than 2 seconds.  Psychiatric: She has a normal mood and affect. Her speech is normal.  Nursing note and vitals reviewed.    ED Treatments / Results  Labs (all labs ordered are listed, but only abnormal results are displayed) Labs Reviewed  POC URINE PREG, ED    EKG  EKG Interpretation None       Radiology No results found.  Procedures Procedures (including critical care time)  Medications Ordered in ED Medications - No data to display   Initial Impression / Assessment and Plan / ED Course  I have reviewed the triage vital  signs and the nursing notes.  Pertinent labs & imaging results that were available during my care of the patient were reviewed by me and considered in my medical decision making (see chart for details).     36 y.o. F who presents with two complaints. Patient reports right shoulder pain that has been ongoing for 3 months. Patient also comes in today with complaints of right sided facial pain 2 days.  No fevers, difficulty swallowing, difficulty breathing.  Patient is tolerating her secretions without any difficulty.  Patient does not see a dentist or have a primary care doctor. Patient is afebrile, non-toxic appearing, sitting comfortably on examination table. Vital signs reviewed and stable.  No neuro deficits noted on exam.  Consider rotator cuff injury versus impingement versus radiculopathy pain.  Also consider dental abscess versus dental pain.  History/physical exam not concerning for septic arthritis, Ludwig  angina, peritonsillar abscess.  On exam, patient has no identifiable abscess that needs I&D in the department.  Will plan to send patient home with antibiotic therapy. Patient with no known drug allergies.  Also for shoulder, will plan to treat as rotator impingement.  Patient instructed to follow-up with orthopedics for further evaluation. Patient had ample opportunity for questions and discussion. All patient's questions were answered with full understanding. Strict return precautions discussed. Patient expresses understanding and agreement to plan.     Final Clinical Impressions(s) / ED Diagnoses   Final diagnoses:  Chronic right shoulder pain  Pain, dental    ED Discharge Orders        Ordered    cyclobenzaprine (FLEXERIL) 10 MG tablet  2 times daily PRN     07/20/17 1746    amoxicillin (AMOXIL) 500 MG capsule  2 times daily     07/20/17 1746       Maxwell CaulLayden, Lindsey A, PA-C 07/21/17 0324    Arby BarrettePfeiffer, Marcy, MD 07/24/17 1840

## 2017-07-20 NOTE — Discharge Instructions (Signed)
Take the medications as directed.   You can take Tylenol or Ibuprofen as directed for pain.  You can apply warm compresses to the area.   The exam and treatment you received today has been provided on an emergency basis only. This is not a substitute for complete medical or dental care. If your problem worsens or new symptoms (problems) appear, and you are unable to arrange prompt follow-up care with your dentist, call or return to this location. If you do not have a dentist, please follow-up with one on the list provided  CALL YOUR DENTIST OR RETURN IMMEDIATELY IF you develop a fever, rash, difficulty breathing or swallowing, neck or facial swelling, or other potentially serious concerns.   Take Flexeril as prescribed. This medication will make you drowsy so do not drive or drink alcohol when taking it.  Follow-up with the referred orthopedic doctor for further evaluation.

## 2017-11-19 ENCOUNTER — Encounter (HOSPITAL_COMMUNITY): Payer: Self-pay

## 2017-11-19 ENCOUNTER — Emergency Department (HOSPITAL_COMMUNITY)
Admission: EM | Admit: 2017-11-19 | Discharge: 2017-11-19 | Disposition: A | Discharge status: Home / Self Care | Payer: Self-pay | Attending: Emergency Medicine | Admitting: Emergency Medicine

## 2017-11-19 DIAGNOSIS — G8929 Other chronic pain: Secondary | ICD-10-CM | POA: Insufficient documentation

## 2017-11-19 DIAGNOSIS — Z79899 Other long term (current) drug therapy: Secondary | ICD-10-CM | POA: Insufficient documentation

## 2017-11-19 DIAGNOSIS — M25511 Pain in right shoulder: Secondary | ICD-10-CM | POA: Insufficient documentation

## 2017-11-19 MED ORDER — MELOXICAM 7.5 MG PO TABS: 7.5000 mg | ORAL_TABLET | Freq: Every day | ORAL | 0 refills | Status: DC

## 2017-11-19 MED ORDER — KETOROLAC TROMETHAMINE 30 MG/ML IJ SOLN
30.0000 mg | Freq: Once | INTRAMUSCULAR | Status: AC
Start: 2017-11-19 — End: 2017-11-19
  Administered 2017-11-19: 30 mg via INTRAMUSCULAR
  Filled 2017-11-19: qty 1

## 2017-11-19 NOTE — ED Provider Notes (Signed)
[facility] Provider Note   CSN: [id number] Arrival date & time: 11/19/17  0840     History   Chief Complaint No chief complaint on file.   HPI Maria Morris is a 37 y.o. female w PMHx of anemia, presenting to ED with right shoulder pain that has been intermittent and ongoing for greater than 1 year.  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  She states the pain is worse with movement and lifting.  Pain is worse in the superior and anterior aspect of the shoulder with some radiation down the upper arm.  She has been taking OTC pain medications without significant relief.  Per chart review, patient was evaluated in November 2018 for the same complaint and discharged with instructions for outpatient follow-up and symptomatic management.  Patient denies new injuries, numbness or weakness of extremity.   The history is provided by the patient.    Past Medical History:  Diagnosis Date  . Anemia     There are no active problems to display for this patient.   History reviewed. No pertinent surgical history.   OB History   None      Home Medications    Prior to Admission medications   Medication Sig Start Date End Date Taking? Authorizing Provider  acetaminophen (TYLENOL) 500 MG tablet Take 1,000-2,000 mg by mouth 3 (three) times daily as needed for headache (migraine).    [provider]  amoxicillin (AMOXIL) 500 MG capsule Take 1 capsule (500 mg total) by mouth 2 (two) times daily. 07/20/17   Maxwell CaulLayden, Lindsey A, PA-C  aspirin-acetaminophen-caffeine (EXCEDRIN MIGRAINE) [id number] MG tablet Take 2 tablets by mouth 2 (two) times daily as needed for headache or migraine.    [provider]  benzonatate (TESSALON) 100 MG capsule Take 1 capsule (100 mg total) by mouth every 8 (eight) hours. Patient not taking: Reported on 07/20/2017 09/05/16   Garlon HatchetSanders, Lisa M, PA-C  cyclobenzaprine  (FLEXERIL) 10 MG tablet Take 1 tablet (10 mg total) by mouth 2 (two) times daily as needed for muscle spasms. 07/20/17   Maxwell CaulLayden, Lindsey A, PA-C  GuaiFENesin (MUCINEX PO) Take by mouth every 4 (four) hours as needed (cough).    [provider]  HYDROcodone-acetaminophen (HYCET) 7.5-325 mg/15 ml solution Take 15 mLs by mouth every 8 (eight) hours as needed for moderate pain. Patient not taking: Reported on 07/20/2017 12/28/15   Barrett HenleNadeau, Nicole Elizabeth, PA-C  meloxicam (MOBIC) 7.5 MG tablet Take 1 tablet (7.5 mg total) by mouth daily. 11/19/17   Cellie Dardis, [place] N, PA-C  OVER THE COUNTER MEDICATION Place 1 drop into both eyes 2 (two) times daily as needed (dry eyes). Over the counter lubricating eye drops    [provider]  oxymetazoline (AFRIN NASAL SPRAY) 0.05 % nasal spray Place 1 spray into both nostrils 2 (two) times daily. Patient not taking: Reported on 07/20/2017 12/28/15   Barrett HenleNadeau, Nicole Elizabeth, PA-C    Family History No family history on file.  Social History Social History   Tobacco Use  . Smoking status: Never Smoker  . Smokeless tobacco: Never Used  Substance Use Topics  . Alcohol use: Yes  . Drug use: No     Allergies   Patient has no known allergies.   Review of Systems Review of Systems  Musculoskeletal: Positive for arthralgias. Negative for joint swelling.  Neurological: Negative for numbness.     Physical Exam Updated Vital Signs  BP 114/60 (BP Location: Right Arm)   Pulse 85   Temp 98.4 F (36.9 C) (Oral)   Resp 20   SpO2 97%   Physical Exam  Constitutional: She appears well-developed and well-nourished.  Morbidly obese.  Not in distress.  HENT:  Head: Normocephalic and atraumatic.  Eyes: Conjunctivae are normal.  Cardiovascular: Normal rate and intact distal pulses.  Pulmonary/Chest: Effort normal.  Musculoskeletal:  Right shoulder with generalized tenderness.  No swelling, crepitus, erythema, warmth or deformity.  Pain with  range of motion in all directions.  5/5 grip strength bilateral upper extremities.  Normal sensation.  Intact distal pulses.  Psychiatric: She has a normal mood and affect. Her behavior is normal.  Nursing note and vitals reviewed.    ED Treatments / Results  Labs (all labs ordered are listed, but only abnormal results are displayed) Labs Reviewed - No data to display  EKG None  Radiology No results found.  Procedures Procedures (including critical care time)  Medications Ordered in ED Medications  ketorolac (TORADOL) 30 MG/ML injection 30 mg (has no administration in time range)     Initial Impression / Assessment and Plan / ED Course  I have reviewed the triage vital signs and the nursing notes.  Pertinent labs & imaging results that were available during my care of the patient were reviewed by me and considered in my medical decision making (see chart for details).     Patient presenting to the ED with chronic right shoulder pain, no new injuries.  Exam unconcerning for septic arthritis.  Suspect tendinitis versus degenerative changes.  NV intact. Dose of Toradol given in the ED for pain.  Will discharge with sling for comfort, and PCP referral for follow-up.  Will also prescribe NSAIDs for pain.  Discussed other conservative measures.  Safe for discharge.  Discussed results, findings, treatment and follow up. Patient advised of return precautions. Patient verbalized understanding and agreed with plan.   Final Clinical Impressions(s) / ED Diagnoses   Final diagnoses:  Chronic right shoulder pain    ED Discharge Orders        Ordered    meloxicam (MOBIC) 7.5 MG tablet  Daily     11/19/17 1119       Airiana Elman, [place] N, [place] 11/19/17 1119    Doug Sou, MD 11/19/17 1204

## 2017-11-19 NOTE — Discharge Instructions (Signed)
Please read instructions below. Apply ice to your shoulder for 20 minutes at a time. You can take meloxicam every 12 hours, with meals, as needed for pain. Schedule an appointment to establish primary care and discuss chronic management of your shoulder pain.

## 2017-11-19 NOTE — ED Triage Notes (Signed)
Patient complains of right shoulder pain that has been chronic in nature greater than 1 year, has been seen for same, no new trauma. Alert and oriented

## 2018-07-24 ENCOUNTER — Encounter: Payer: Self-pay | Admitting: Emergency Medicine

## 2018-07-24 ENCOUNTER — Emergency Department (HOSPITAL_COMMUNITY)
Admission: EM | Admit: 2018-07-24 | Discharge: 2018-07-24 | Disposition: A | Discharge status: Home / Self Care | Payer: Self-pay | Attending: Emergency Medicine | Admitting: Emergency Medicine

## 2018-07-24 DIAGNOSIS — Z79899 Other long term (current) drug therapy: Secondary | ICD-10-CM | POA: Insufficient documentation

## 2018-07-24 DIAGNOSIS — B9789 Other viral agents as the cause of diseases classified elsewhere: Secondary | ICD-10-CM | POA: Insufficient documentation

## 2018-07-24 DIAGNOSIS — J069 Acute upper respiratory infection, unspecified: Secondary | ICD-10-CM

## 2018-07-24 DIAGNOSIS — R197 Diarrhea, unspecified: Secondary | ICD-10-CM

## 2018-07-24 LAB — POC URINE PREG, ED: Preg Test, Ur: NEGATIVE

## 2018-07-24 MED ORDER — ONDANSETRON 4 MG PO TBDP
8.0000 mg | ORAL_TABLET | Freq: Once | ORAL | Status: AC
Start: 2018-07-24 — End: 2018-07-24
  Administered 2018-07-24: 8 mg via ORAL
  Filled 2018-07-24: qty 2

## 2018-07-24 MED ORDER — BENZONATATE 100 MG PO CAPS: 100.0000 mg | ORAL_CAPSULE | Freq: Three times a day (TID) | ORAL | 0 refills | Status: DC

## 2018-07-24 MED ORDER — ACETAMINOPHEN 325 MG PO TABS
650.0000 mg | ORAL_TABLET | Freq: Once | ORAL | Status: AC
  Administered 2018-07-24: 650 mg via ORAL
  Filled 2018-07-24: qty 2

## 2018-07-24 MED ORDER — LOPERAMIDE HCL 2 MG PO CAPS: 2.0000 mg | ORAL_CAPSULE | Freq: Four times a day (QID) | ORAL | 0 refills | Status: DC | PRN

## 2018-07-24 MED ORDER — SALINE SPRAY 0.65 % NA SOLN: 1.0000 | NASAL | 0 refills | Status: DC | PRN

## 2018-07-24 NOTE — Discharge Instructions (Addendum)
You have been seen today for diarrhea and upper respiratory symptoms . Please read and follow all provided instructions.   1. Medications: tessalon for cough, nasal saline spray for congestion, usual home medications 2. Treatment: rest, drink plenty of fluids 3. Follow Up: Please follow up with your primary doctor in 3 days for discussion of your diagnoses and further evaluation after today's visit; if you do not have a primary care doctor use the resource guide provided to find one; Please return to the ER for any new or worsening symptoms.   Take medications as prescribed. Return to the emergency room for worsening condition or new concerning symptoms. Follow up with your regular doctor. If you don't have a regular doctor use one of the numbers below to establish a primary care doctor.   Emergency Department Resource Guide 1) Find a Doctor and Pay Out of Pocket Although you won't have to find out who is covered by your insurance plan, it is a good idea to ask around and get recommendations. You will then need to call the office and see if the doctor you have chosen will accept you as a new patient and what types of options they offer for patients who are self-pay. Some doctors offer discounts or will set up payment plans for their patients who do not have insurance, but you will need to ask so you aren't surprised when you get to your appointment.  2) Contact Your Local Health Department Not all health departments have doctors that can see patients for sick visits, but many do, so it is worth a call to see if yours does. If you don't know where your local health department is, you can check in your phone book. The CDC also has a tool to help you locate your state's health department, and many state websites also have listings of all of their local health departments.  3) Find a Walk-in Clinic If your illness is not likely to be very severe or complicated, you may want to try a walk in clinic.  These are popping up all over the country in pharmacies, drugstores, and shopping centers. They're usually staffed by nurse practitioners or physician assistants that have been trained to treat common illnesses and complaints. They're usually fairly quick and inexpensive. However, if you have serious medical issues or chronic medical problems, these are probably not your best option.  No Primary Care Doctor: Call Health Connect at  [phone] - they can help you locate a primary care doctor that  accepts your insurance, provides certain services, etc. Physician Referral [phone]  Emergency Department Resource Guide 1) Find a Doctor and Pay Out of Pocket Although you won't have to find out who is covered by your insurance plan, it is a good idea to ask around and get recommendations. You will then need to call the office and see if the doctor you have chosen will accept you as a new patient and what types of options they offer for patients who are self-pay. Some doctors offer discounts or will set up payment plans for their patients who do not have insurance, but you will need to ask so you aren't surprised when you get to your appointment.  2) Contact Your Local Health Department Not all health departments have doctors that can see patients for sick visits, but many do, so it is worth a call to see if yours does. If you don't know where your local health department is, you can check in your  phone book. The CDC also has a tool to help you locate your state's health department, and many state websites also have listings of all of their local health departments.  3) Find a Walk-in Clinic If your illness is not likely to be very severe or complicated, you may want to try a walk in clinic. These are popping up all over the country in pharmacies, drugstores, and shopping centers. They're usually staffed by nurse practitioners or physician assistants that have been trained to treat common  illnesses and complaints. They're usually fairly quick and inexpensive. However, if you have serious medical issues or chronic medical problems, these are probably not your best option.  No Primary Care Doctor: Call Health Connect at  [phone] - they can help you locate a primary care doctor that  accepts your insurance, provides certain services, etc. Physician Referral Service- [phone]  Chronic Pain Problems: Organization         Address  Phone   Notes  [facility]  [phone] Patients need to be referred by their primary care doctor.   Medication Assistance: Organization         Address  Phone   Notes  [facility] [address] [place], [place] [address] [phone] --Must be a resident of [facility] -- Must have NO insurance coverage whatsoever (no Medicaid/ Medicare, etc.) -- The pt. MUST have a primary care doctor that directs their care regularly and follows them in the community   [facility]  [phone]   [employer]  [phone]    Agencies that provide inexpensive medical care: Organization         Address  Phone   Notes  [facility]  [phone]   [facility]    [phone]   [facility] [address] [place], [place] [address] [phone]   [facility] [address], [place] [phone]   [facility]    [phone]   [facility]    [phone]   [facility] and [facility]  [address], [place] Phone:  [phone], Fax:  [phone] Hours of Operation:  9 am - 6 pm, M-F.  Also accepts Medicaid/Medicare and self-pay.  [facility]  [address], [place] Phone: [phone], Fax: [phone]. Hours of Operation:  8:30 am - 5:30 pm, M-F.  Also accepts Medicaid and self-pay.  [facility] [place] [address], [place] Phone: [phone]   [facility] [address] Natasha Bence [place], [place] [phone], Ext. [phone] Mondays & Thursdays: 7-9 AM.  First 15 patients are seen on a first come, first serve basis.    Medicaid-accepting [facility] Providers:  Organization         Address  Phone   Notes  [facility] [address], [place] [phone] Also accepts self-pay patients.  [facility] [address] Laurell Josephs [place], [place]  [phone]   [facility] [address], [place] [phone]   [facility] [address] [place] [phone]   Renaye Rakers [address], [place]   [phone] Only accepts [place] patients after they have their name applied to their card.   Self-Pay (  no insurance) in [place]:  Organization         Address  Phone   Notes  Sickle Cell Patients, [facility] [address], [place] [phone]   [facility] [address], [place] [phone]   [facility] [place]  [address], [place] [phone]   [facility]/Dr. Osei-Bonsu  [address] [place] [address], [place] or [phone], [place] [phone] Phone number for both [place] and [place] locations is the same.  [facility] [address], [place] [phone]   [facility] [place] [address] [place] [address], [place] or [address] [phone]   [facility] [address], [place] [phone], phone; [phone]) [phone], fax Sees patients 1st and 3rd Saturday of every month.  Must not qualify for public or private insurance (i.e. Medicaid, Medicare, [place] Health Choice, Veterans' Benefits)  Household income should be no more than 200% of the poverty  level The clinic cannot treat you if you are pregnant or think you are pregnant  Sexually transmitted diseases are not treated at the clinic.

## 2018-07-24 NOTE — ED Notes (Signed)
Pregnancy test negative per mini lab staff, has not crossed over yet. Provider notified.

## 2018-07-24 NOTE — ED Triage Notes (Signed)
Pt presents for evaluation of 5 day hx of URI symptoms with some n/v/d intermittently. Pt reports fever and chills as well. No medication today, no fever in triage. Uncertain of LMP.

## 2018-07-24 NOTE — ED Provider Notes (Signed)
[facility] Provider Note   CSN: [id number] Arrival date & time: 07/24/18  [id number]     History   Chief Complaint Chief Complaint  Patient presents with  . URI  . Diarrhea    HPI Waymond CeraMarquia Vanderburg is a 37 y.o. female presenting with constant congestion, nausea, vomiting, diarrhea, subjective fever, and productive cough onset 5 days ago. Patient states she has tried Theraflu, flonase, cough drops, and ibuprofen without relief. Patient states she has had sick exposures at work. Patient reports associated intermittent bilateral frontal headache, body aches, diffuse weakness, and loss of appetite. Patient describes headache as similar headaches in the past. Patient describes stool as brown without blood and states she has multiple episodes of diarrhea per day. Pt reports diarrhea interferes with her sleep at night. Patient states she has about 2 episodes of vomiting per day. Patient denies any blood in her vomit. Patient reports intermittent epigastric abdominal pain. Patient denies taking any medications today. Pt reports she is unsure when her LMP and states she has irregular periods.   HPI  Past Medical History:  Diagnosis Date  . Anemia     There are no active problems to display for this patient.   History reviewed. No pertinent surgical history.   OB History   None      Home Medications    Prior to Admission medications   Medication Sig Start Date End Date Taking? Authorizing Provider  acetaminophen (TYLENOL) 500 MG tablet Take 1,000-2,000 mg by mouth 3 (three) times daily as needed for headache (migraine).    [provider]  amoxicillin (AMOXIL) 500 MG capsule Take 1 capsule (500 mg total) by mouth 2 (two) times daily. 07/20/17   Maxwell CaulLayden, Lindsey A, PA-C  aspirin-acetaminophen-caffeine (EXCEDRIN MIGRAINE) [phone] MG tablet Take 2 tablets by mouth 2 (two) times daily as needed for headache or migraine.    [provider]  benzonatate (TESSALON) 100 MG capsule Take 1 capsule (100 mg total) by mouth every 8 (eight) hours. 07/24/18   Carlyle BasquesHernandez, Zhoey Blackstock P, PA-C  cyclobenzaprine (FLEXERIL) 10 MG tablet Take 1 tablet (10 mg total) by mouth 2 (two) times daily as needed for muscle spasms. 07/20/17   Maxwell CaulLayden, Lindsey A, PA-C  GuaiFENesin (MUCINEX PO) Take by mouth every 4 (four) hours as needed (cough).    [provider]  HYDROcodone-acetaminophen (HYCET) 7.5-325 mg/15 ml solution Take 15 mLs by mouth every 8 (eight) hours as needed for moderate pain. Patient not taking: Reported on 07/20/2017 12/28/15   Barrett HenleNadeau, Nicole Elizabeth, PA-C  loperamide (IMODIUM) 2 MG capsule Take 1 capsule (2 mg total) by mouth 4 (four) times daily as needed for diarrhea or loose stools. 07/24/18   Carlyle BasquesHernandez, Layli Capshaw P, PA-C  meloxicam (MOBIC) 7.5 MG tablet Take 1 tablet (7.5 mg total) by mouth daily. 11/19/17   Robinson, [place] N, PA-C  OVER THE COUNTER MEDICATION Place 1 drop into both eyes 2 (two) times daily as needed (dry eyes). Over the counter lubricating eye drops    [provider]  oxymetazoline (AFRIN NASAL SPRAY) 0.05 % nasal spray Place 1 spray into both nostrils 2 (two) times daily. Patient not taking: Reported on 07/20/2017 12/28/15   Barrett HenleNadeau, Nicole Elizabeth, PA-C  sodium chloride (OCEAN) 0.65 % SOLN nasal spray Place 1 spray into both nostrils as needed for congestion. 07/24/18   Leretha DykesHernandez, Jonaya Freshour P, PA-C    Family History No family history on file.  Social History Social History   Tobacco  Use  . Smoking status: Never Smoker  . Smokeless tobacco: Never Used  Substance Use Topics  . Alcohol use: Yes  . Drug use: No     Allergies   Patient has no known allergies.   Review of Systems Review of Systems  Constitutional: Positive for appetite change and fever. Negative for chills and diaphoresis.  HENT: Positive for congestion, ear pain and rhinorrhea. Negative for sore throat.   Eyes: Negative for  visual disturbance.  Respiratory: Positive for cough. Negative for shortness of breath.   Cardiovascular: Negative for chest pain, palpitations and leg swelling.  Gastrointestinal: Positive for abdominal pain, diarrhea, nausea and vomiting. Negative for abdominal distention and blood in stool.  Endocrine: Negative for cold intolerance and heat intolerance.  Genitourinary: Negative for dysuria.  Musculoskeletal: Negative for back pain.  Skin: Negative for color change and rash.  Allergic/Immunologic: Negative for immunocompromised state.  Neurological: Positive for weakness and headaches. Negative for dizziness, syncope and numbness.  Hematological: Negative for adenopathy.  Psychiatric/Behavioral: Positive for sleep disturbance.     Physical Exam Updated Vital Signs BP (!) 141/79 (BP Location: Right Arm)   Pulse 75   Temp 98.5 F (36.9 C) (Oral)   Resp 12   LMP  (LMP Unknown)   SpO2 96%   Physical Exam  Constitutional: She appears well-developed and well-nourished. No distress.  HENT:  Head: Normocephalic and atraumatic.  Right Ear: External ear normal. Tympanic membrane is not erythematous. A middle ear effusion is present.  Left Ear: External ear normal. Tympanic membrane is not erythematous. A middle ear effusion is present.  Nose: Mucosal edema and rhinorrhea present.  Mouth/Throat: Uvula is midline and mucous membranes are normal. Posterior oropharyngeal erythema present. No oropharyngeal exudate or posterior oropharyngeal edema.  Eyes: Conjunctivae are normal. Right eye exhibits no discharge. Left eye exhibits no discharge.  Neck: Normal range of motion. Neck supple.  Cardiovascular: Normal rate, regular rhythm and normal heart sounds. Exam reveals no gallop and no friction rub.  No murmur heard. Pulmonary/Chest: Effort normal and breath sounds normal. No respiratory distress. She has no wheezes. She has no rales.  Abdominal: Soft. She exhibits no distension. There is no  tenderness. There is no guarding.  Musculoskeletal: Normal range of motion.  Lymphadenopathy:    She has no cervical adenopathy.  Neurological: She is alert.  Skin: Skin is warm. No rash noted. She is not diaphoretic.  Psychiatric: She has a normal mood and affect.  Nursing note and vitals reviewed.    ED Treatments / Results  Labs (all labs ordered are listed, but only abnormal results are displayed) Labs Reviewed  POC URINE PREG, ED    EKG None  Radiology No results found.  Procedures Procedures (including critical care time)  Medications Ordered in ED Medications  ondansetron (ZOFRAN-ODT) disintegrating tablet 8 mg (8 mg Oral Given 07/24/18 0823)  acetaminophen (TYLENOL) tablet 650 mg (650 mg Oral Given 07/24/18 1610)     Initial Impression / Assessment and Plan / ED Course  I have reviewed the triage vital signs and the nursing notes.  Pertinent labs & imaging results that were available during my care of the patient were reviewed by me and considered in my medical decision making (see chart for details).  Clinical Course as of Jul 24 926  Mon Jul 24, 2018  0920 Pregnancy test is negative.   Preg Test, Ur: NEGATIVE [AH]    Clinical Course User Index [AH] Leretha Dykes, PA-C  Patients symptoms are consistent with URI, likely viral etiology. CXR is not indicated at this time. Patient does not appear ill, and physical exam is consistent with a URI. Discussed that antibiotics are not indicated for viral infections. Pt will be discharged with symptomatic treatment. Provided tessalon for cough, nasal spray for congestion, and imodium for diarrhea at night. Verbalizes understanding and is agreeable with plan. Discussed returning to ER with new or worsening symptoms. Advised pt to follow up with PCP in 3 days to evaluate improvement of symptoms. Pt is hemodynamically stable & in NAD prior to dc.     Final Clinical Impressions(s) / ED Diagnoses   Final diagnoses:    Viral URI with cough  Diarrhea, unspecified type    ED Discharge Orders         Ordered    loperamide (IMODIUM) 2 MG capsule  4 times daily PRN     07/24/18 0831    sodium chloride (OCEAN) 0.65 % SOLN nasal spray  As needed     07/24/18 0831    benzonatate (TESSALON) 100 MG capsule  Every 8 hours     07/24/18 0831           Leretha Dykes, PA-C 07/24/18 0928    Samuel Jester, DO 07/25/18 [id number]

## 2019-03-13 ENCOUNTER — Encounter (HOSPITAL_COMMUNITY): Payer: Self-pay | Admitting: Emergency Medicine

## 2019-03-13 ENCOUNTER — Emergency Department (HOSPITAL_COMMUNITY)
Admission: EM | Admit: 2019-03-13 | Discharge: 2019-03-13 | Disposition: A | Discharge status: Home / Self Care | Payer: Self-pay | Attending: Emergency Medicine | Admitting: Emergency Medicine

## 2019-03-13 ENCOUNTER — Emergency Department (HOSPITAL_COMMUNITY): Payer: Self-pay

## 2019-03-13 ENCOUNTER — Other Ambulatory Visit: Payer: Self-pay

## 2019-03-13 DIAGNOSIS — R110 Nausea: Secondary | ICD-10-CM | POA: Insufficient documentation

## 2019-03-13 DIAGNOSIS — Z79899 Other long term (current) drug therapy: Secondary | ICD-10-CM | POA: Insufficient documentation

## 2019-03-13 DIAGNOSIS — R1010 Upper abdominal pain, unspecified: Secondary | ICD-10-CM

## 2019-03-13 DIAGNOSIS — R739 Hyperglycemia, unspecified: Secondary | ICD-10-CM

## 2019-03-13 DIAGNOSIS — K8020 Calculus of gallbladder without cholecystitis without obstruction: Secondary | ICD-10-CM | POA: Insufficient documentation

## 2019-03-13 LAB — COMPREHENSIVE METABOLIC PANEL
ALT: 29 U/L (ref 0–44)
AST: 40 U/L (ref 15–41)
Albumin: 3.3 g/dL — ABNORMAL LOW (ref 3.5–5.0)
Alkaline Phosphatase: 68 U/L (ref 38–126)
Anion gap: 18 — ABNORMAL HIGH (ref 5–15)
BUN: 11 mg/dL (ref 6–20)
CO2: 23 mmol/L (ref 22–32)
Calcium: 8.9 mg/dL (ref 8.9–10.3)
Chloride: 96 mmol/L — ABNORMAL LOW (ref 98–111)
Creatinine, Ser: 0.84 mg/dL (ref 0.44–1.00)
GFR calc Af Amer: >60 mL/min (ref >60)
GFR calc non Af Amer: >60 mL/min (ref >60)
Glucose, Bld: 337 mg/dL — ABNORMAL HIGH (ref 70–99)
Potassium: 4.1 mmol/L (ref 3.5–5.1)
Sodium: 137 mmol/L (ref 135–145)
Total Bilirubin: 0.7 mg/dL (ref 0.3–1.2)
Total Protein: 6.5 g/dL (ref 6.5–8.1)

## 2019-03-13 LAB — CBC
HCT: 41.7 % (ref 36.0–46.0)
Hemoglobin: 13.6 g/dL (ref 12.0–15.0)
MCH: 29.6 pg (ref 26.0–34.0)
MCHC: 32.6 g/dL (ref 30.0–36.0)
MCV: 90.7 fL (ref 80.0–100.0)
Platelets: 193 10*3/uL (ref 150–400)
RBC: 4.6 MIL/uL (ref 3.87–5.11)
RDW: 13.5 % (ref 11.5–15.5)
WBC: 7.3 10*3/uL (ref 4.0–10.5)
nRBC: 0 % (ref 0.0–0.2)

## 2019-03-13 LAB — TROPONIN I (HIGH SENSITIVITY)
Troponin I (High Sensitivity): 5 ng/L (ref <18)
Troponin I (High Sensitivity): 4 ng/L (ref <18)

## 2019-03-13 LAB — I-STAT BETA HCG BLOOD, ED (MC, WL, AP ONLY): I-stat hCG, quantitative: <5 m[IU]/mL (ref <5)

## 2019-03-13 LAB — LIPASE, BLOOD: Lipase: 34 U/L (ref 11–51)

## 2019-03-13 IMAGING — DX CHEST - 2 VIEW
2 series · 2 of 2 positions shown · non-contrast
Comparison: [DATE]

CLINICAL DATA: Epigastric pain, chest pain

EXAM:
CHEST - 2 VIEW

[w chest pa]
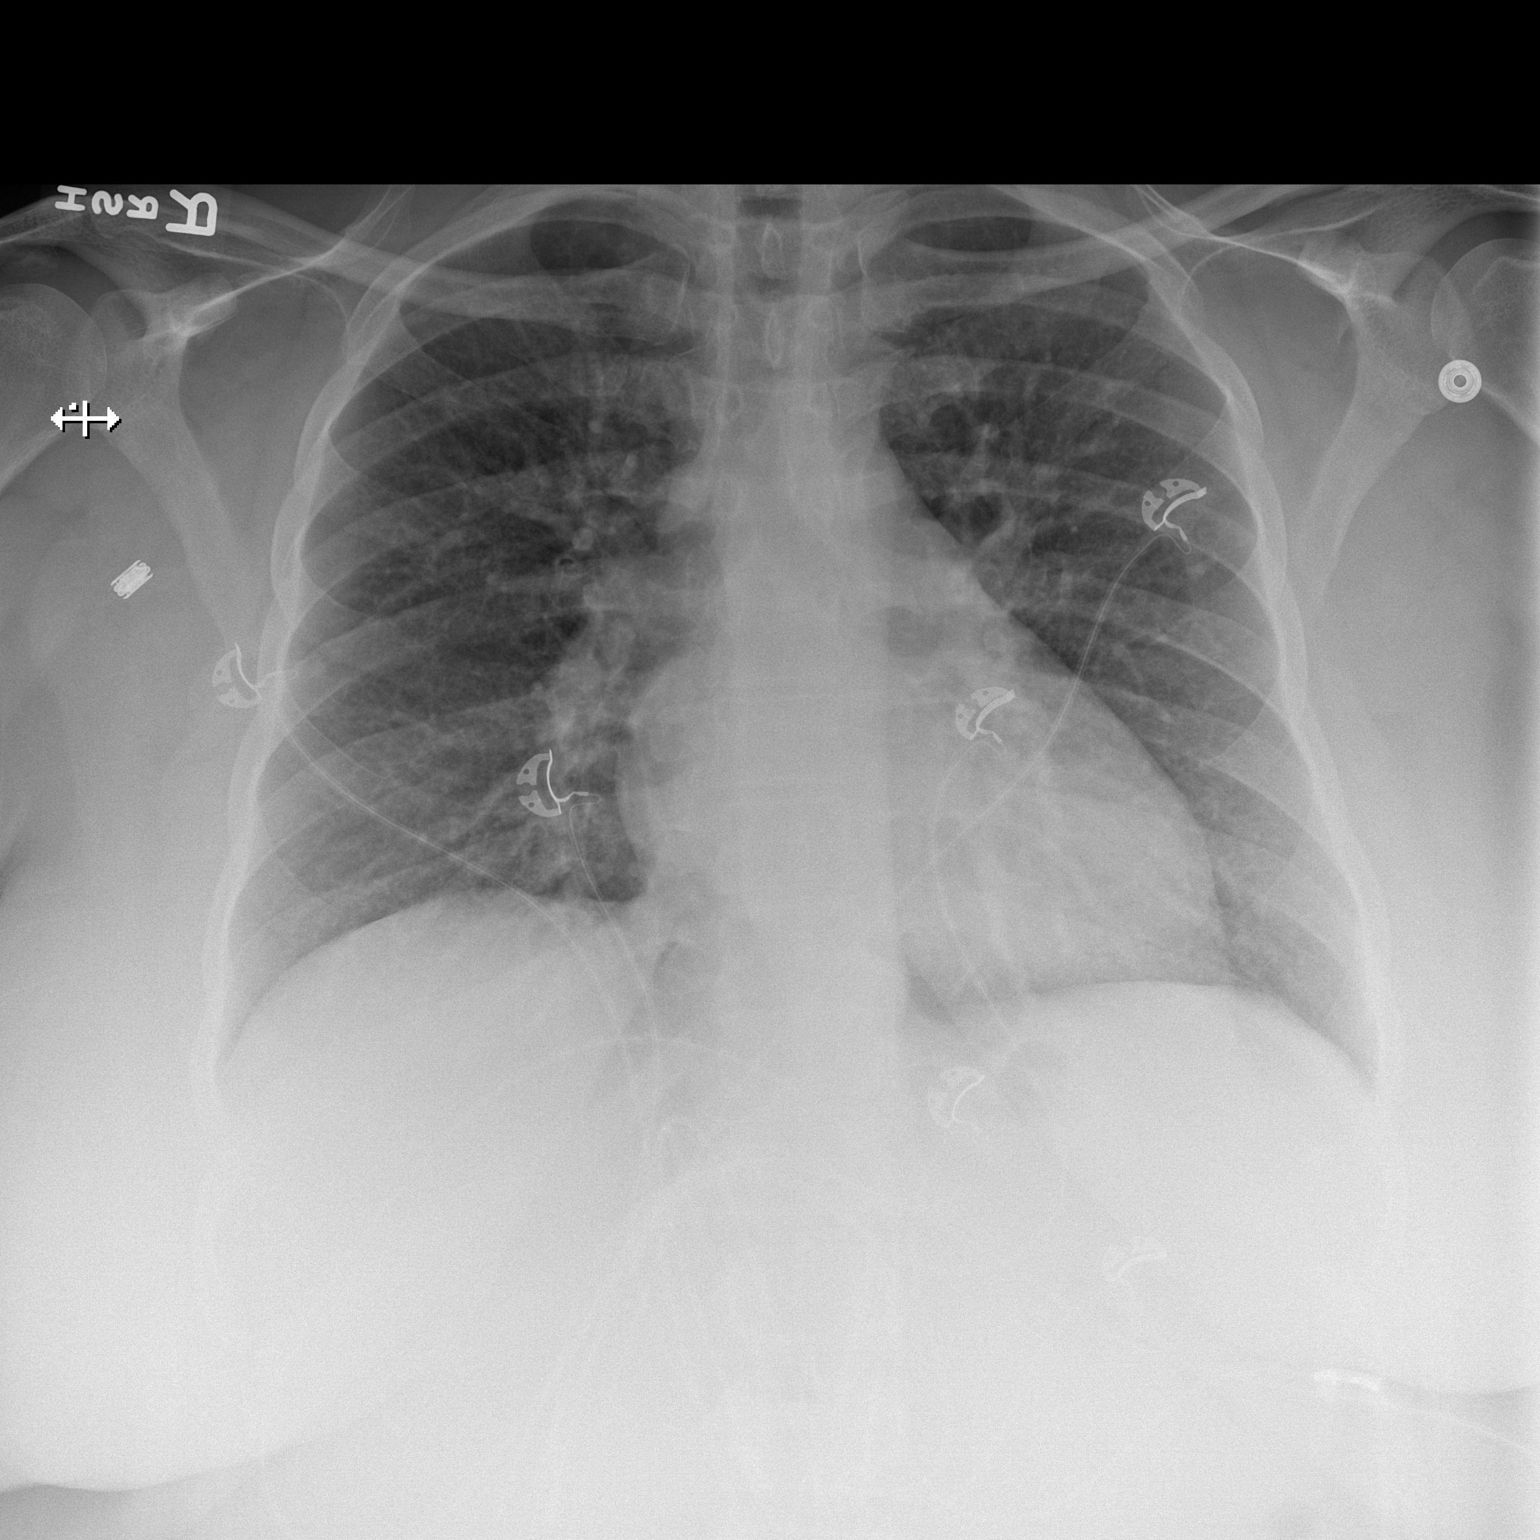

[w chest lat]
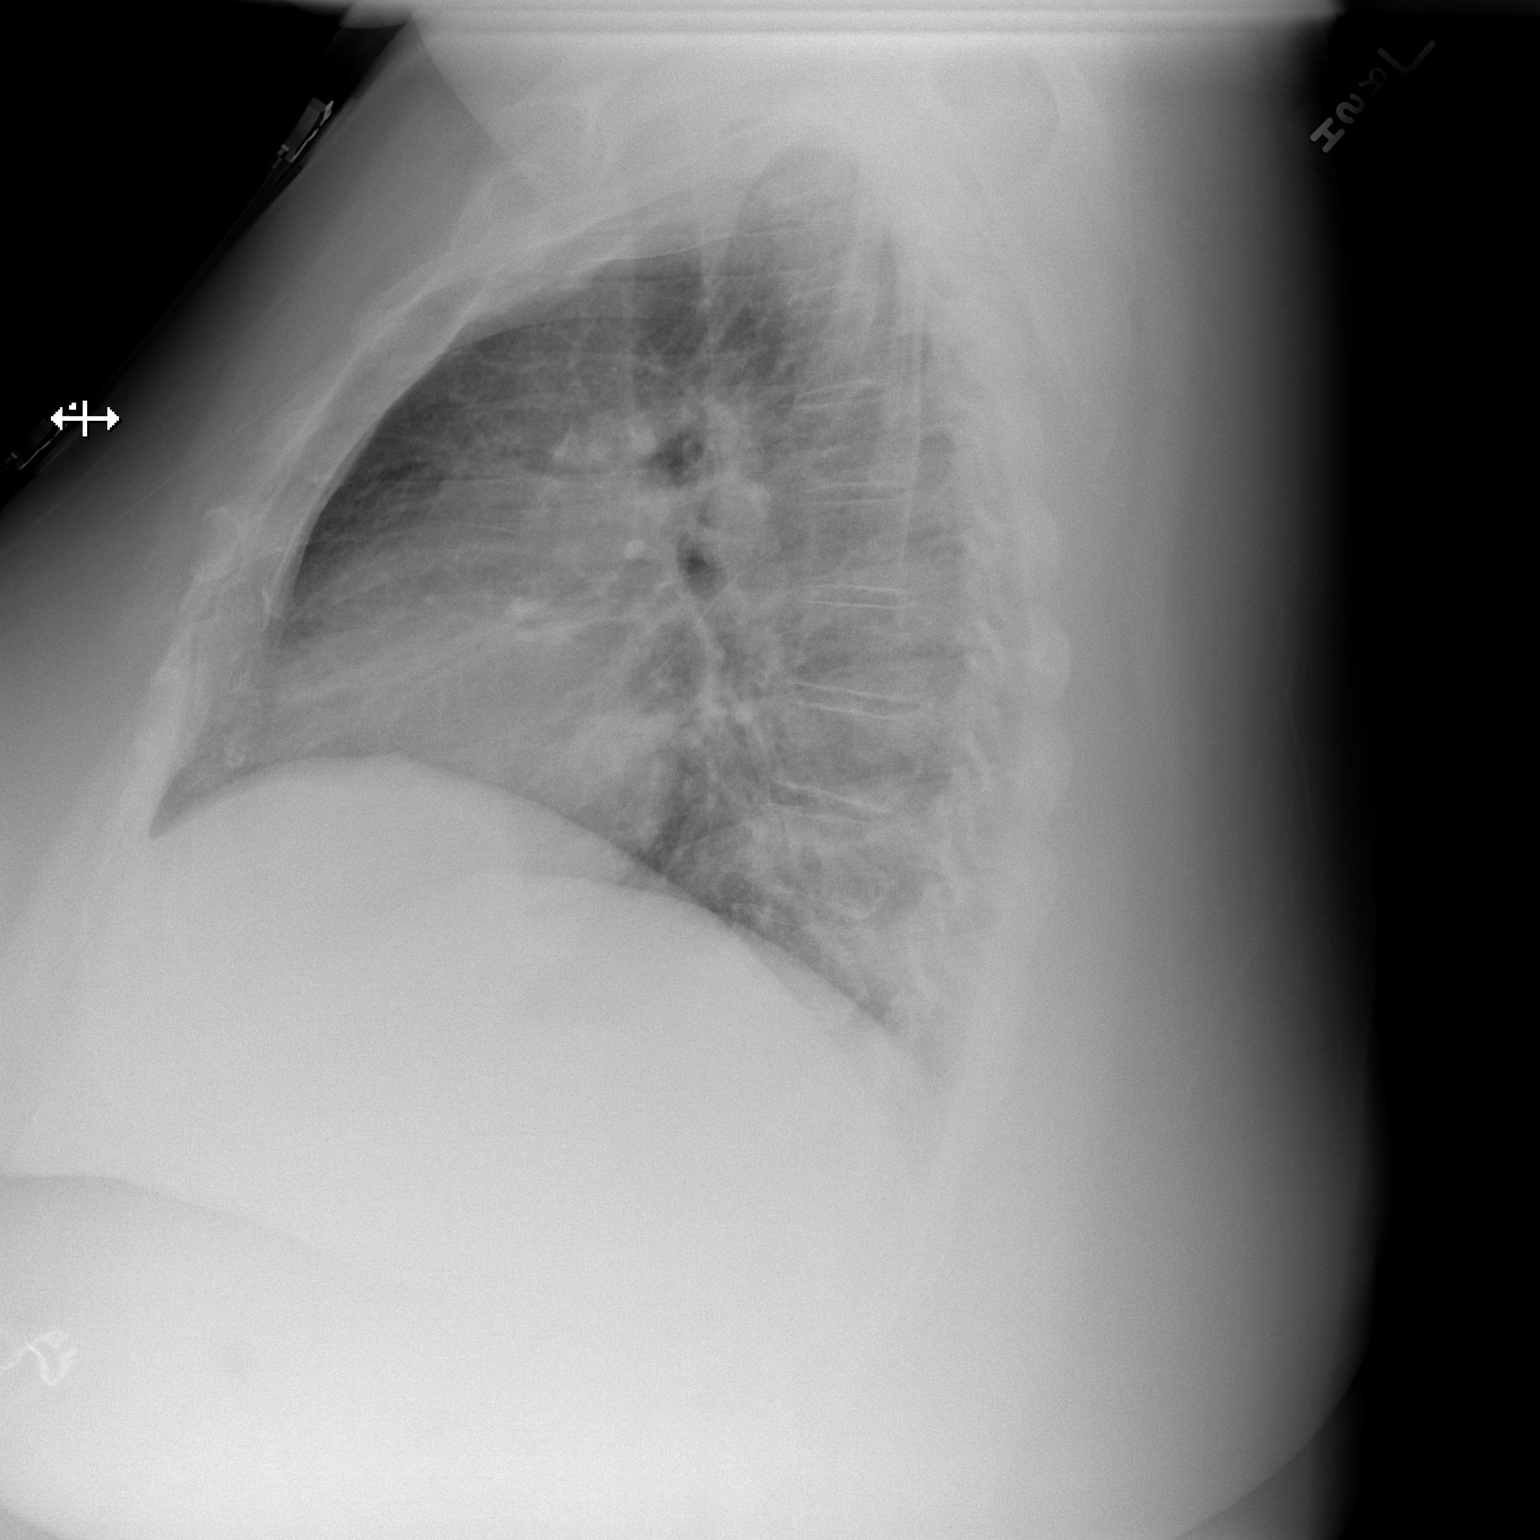

[2 of 2 positions shown; findings below may reference images not displayed]

FINDINGS: Heart and mediastinal contours are within normal limits. No focal
opacities or effusions. No acute bony abnormality.
IMPRESSION: No active cardiopulmonary disease.

## 2019-03-13 MED ORDER — FAMOTIDINE 20 MG PO TABS: 20.0000 mg | ORAL_TABLET | Freq: Two times a day (BID) | ORAL | 0 refills | Status: DC

## 2019-03-13 MED ORDER — METFORMIN HCL 500 MG PO TABS: 500.0000 mg | ORAL_TABLET | Freq: Two times a day (BID) | ORAL | 0 refills | Status: DC

## 2019-03-13 MED ORDER — PANTOPRAZOLE SODIUM 40 MG PO TBEC: 40.0000 mg | DELAYED_RELEASE_TABLET | Freq: Every day | ORAL | 0 refills | Status: DC

## 2019-03-13 MED ORDER — HYDROCODONE-ACETAMINOPHEN 5-325 MG PO TABS: 1.0000 | ORAL_TABLET | ORAL | 0 refills | Status: DC | PRN

## 2019-03-13 MED ORDER — SODIUM CHLORIDE 0.9 % IV BOLUS
1000.0000 mL | Freq: Once | INTRAVENOUS | Status: AC
  Administered 2019-03-13: 1000 mL via INTRAVENOUS

## 2019-03-13 MED ORDER — LIDOCAINE VISCOUS HCL 2 % MT SOLN
15.0000 mL | Freq: Once | OROMUCOSAL | Status: AC
  Administered 2019-03-13: 15 mL via ORAL
  Filled 2019-03-13: qty 15

## 2019-03-13 MED ORDER — ALUM & MAG HYDROXIDE-SIMETH 200-200-20 MG/5ML PO SUSP
30.0000 mL | Freq: Once | ORAL | Status: AC
  Administered 2019-03-13: 30 mL via ORAL
  Filled 2019-03-13: qty 30

## 2019-03-13 NOTE — ED Triage Notes (Signed)
Pt reports a chest pain/pressure in the middle of her chest. Pt reports same started last night around 1800 hrs. Pt reports pepto and alka seltzer helped but this morning she ate breakfast and subsequently the pain came back.

## 2019-03-13 NOTE — Discharge Instructions (Addendum)
Your ultrasound today shows gallstones.  If you develop severe or intractable abdominal pain, vomiting, fever, or any other new/concerning symptoms then return to the ER for evaluation.  You are also noted to have an elevated blood sugar today consistent with type 2 diabetes.  It is important to get a primary care physician to help treat this long-term.  We will start on medicine today.

## 2019-03-13 NOTE — ED Provider Notes (Signed)
[facility] EMERGENCY DEPARTMENT Provider Note   CSN: [id number] Arrival date & time: 03/13/19  0908    History   Chief Complaint Chief Complaint  Patient presents with  . Chest Pain    HPI Maria Morris is a 38 y.o. female.   HPI  38 year old female presents with upper abdominal/lower chest pain.  For started around 6 PM last night about 20 minutes after eating.  Took meds such as Pepto-Bismol and Alka-Seltzer and the pain got a little bit better but still had some pain when she went to bed.  When she woke up there was no pain but then after eating breakfast, again about 20 minutes later, she developed some discomfort.  Feels sharp and is about an 8 out of 10 at the inferior sternum/upper abdomen.  Seems to radiate up a little bit upper chest.  No cough, fever, shortness of breath.  No vomiting but she felt a little nauseated.  No diarrhea.  States she has quit smoking over the last 2 months and denies any chronic medical problems.  Dad died of heart attack in his 18s. No new leg swelling/pain or pleuritic pain.  Past Medical History:  Diagnosis Date  . Anemia     There are no active problems to display for this patient.   History reviewed. No pertinent surgical history.   OB History   No obstetric history on file.      Home Medications    Prior to Admission medications   Medication Sig Start Date End Date Taking? Authorizing Provider  acetaminophen (TYLENOL) 500 MG tablet Take 1,000-2,000 mg by mouth 3 (three) times daily as needed for headache (migraine).   Yes [provider]  aspirin-sod bicarb-citric acid (ALKA-SELTZER) 325 MG TBEF tablet Take 325 mg by mouth every 6 (six) hours as needed (indigestion).   Yes [provider]  bismuth subsalicylate (PEPTO BISMOL) 262 MG/15ML suspension Take 30 mLs by mouth every 6 (six) hours as needed for indigestion.   Yes [provider]  amoxicillin (AMOXIL) 500 MG capsule Take 1  capsule (500 mg total) by mouth 2 (two) times daily. Patient not taking: Reported on 03/13/2019 07/20/17   [place]idence Lanius A, PA-C  benzonatate (TESSALON) 100 MG capsule Take 1 capsule (100 mg total) by mouth every 8 (eight) hours. Patient not taking: Reported on 03/13/2019 07/24/18   Darlin Drop P, PA-C  cyclobenzaprine (FLEXERIL) 10 MG tablet Take 1 tablet (10 mg total) by mouth 2 (two) times daily as needed for muscle spasms. Patient not taking: Reported on 03/13/2019 07/20/17   Volanda Napoleon, PA-C  HYDROcodone-acetaminophen (HYCET) 7.5-325 mg/15 ml solution Take 15 mLs by mouth every 8 (eight) hours as needed for moderate pain. Patient not taking: Reported on 07/20/2017 12/28/15   Nona Dell, PA-C  loperamide (IMODIUM) 2 MG capsule Take 1 capsule (2 mg total) by mouth 4 (four) times daily as needed for diarrhea or loose stools. Patient not taking: Reported on 03/13/2019 07/24/18   Darlin Drop P, PA-C  meloxicam (MOBIC) 7.5 MG tablet Take 1 tablet (7.5 mg total) by mouth daily. Patient not taking: Reported on 03/13/2019 11/19/17   Robinson, [place] N, PA-C  oxymetazoline (AFRIN NASAL SPRAY) 0.05 % nasal spray Place 1 spray into both nostrils 2 (two) times daily. Patient not taking: Reported on 07/20/2017 12/28/15   Nona Dell, PA-C  sodium chloride (OCEAN) 0.65 % SOLN nasal spray Place 1 spray into both nostrils as needed for congestion. Patient not  taking: Reported on 03/13/2019 07/24/18   Leretha DykesHernandez, Ana P, PA-C    Family History No family history on file.  Social History Social History   Tobacco Use  . Smoking status: Never Smoker  . Smokeless tobacco: Never Used  Substance Use Topics  . Alcohol use: Yes  . Drug use: No     Allergies   Patient has no known allergies.   Review of Systems Review of Systems  Constitutional: Negative for fever.  Respiratory: Negative for cough and shortness of breath.   Cardiovascular: Positive for chest pain.   Gastrointestinal: Positive for abdominal pain and nausea. Negative for diarrhea and vomiting.  All other systems reviewed and are negative.    Physical Exam Updated Vital Signs BP (!) 132/95   Pulse 92   Temp 98.3 F (36.8 C) (Oral)   Resp 20   Ht 5\' 8"  (1.727 m)   Wt (!) 149.7 kg   SpO2 100%   BMI 50.18 kg/m   Physical Exam Vitals signs and nursing note reviewed.  Constitutional:      Appearance: She is well-developed.  HENT:     Head: Normocephalic and atraumatic.     Right Ear: External ear normal.     Left Ear: External ear normal.     Nose: Nose normal.  Eyes:     General:        Right eye: No discharge.        Left eye: No discharge.  Cardiovascular:     Rate and Rhythm: Normal rate and regular rhythm.     Heart sounds: Normal heart sounds.  Pulmonary:     Effort: Pulmonary effort is normal.     Breath sounds: Normal breath sounds.  Abdominal:     Palpations: Abdomen is soft.     Tenderness: There is abdominal tenderness in the right upper quadrant, epigastric area and left upper quadrant.  Skin:    General: Skin is warm and dry.  Neurological:     Mental Status: She is alert.  Psychiatric:        Mood and Affect: Mood is not anxious.      ED Treatments / Results  Labs (all labs ordered are listed, but only abnormal results are displayed) Labs Reviewed  COMPREHENSIVE METABOLIC PANEL - Abnormal; Notable for the following components:      Result Value   Chloride 96 (*)    Glucose, Bld 337 (*)    Albumin 3.3 (*)    Anion gap 18 (*)    All other components within normal limits  CBC  LIPASE, BLOOD  I-STAT BETA HCG BLOOD, ED ([facility], [facility], [facility] ONLY)  TROPONIN I (HIGH SENSITIVITY)  TROPONIN I (HIGH SENSITIVITY)    EKG EKG Interpretation  Date/Time:  Tuesday March 13 2019 09:18:41 EDT Ventricular Rate:  91 PR Interval:    QRS Duration: 97 QT Interval:  373 QTC Calculation: 459 R Axis:   110 Text Interpretation:  Sinus rhythm Borderline right axis  deviation no acute ST/T changes Confirmed by Pricilla LovelessGoldston, Lashell Moffitt [phone]([id number]) on 03/13/2019 9:22:59 AM   Radiology Dg Chest 2 View  Result Date: 03/13/2019 CLINICAL DATA:  Epigastric pain, chest pain EXAM: CHEST - 2 VIEW COMPARISON:  09/05/2016 FINDINGS: Heart and mediastinal contours are within normal limits. No focal opacities or effusions. No acute bony abnormality. IMPRESSION: No active cardiopulmonary disease. Electronically Signed   By: Charlett NoseKevin  Dover M.D.   On: 03/13/2019 10:24   [place] Abdomen Limited Ruq  Result Date: 03/13/2019  CLINICAL DATA:  Epigastric pain for 1 day EXAM: ULTRASOUND ABDOMEN LIMITED RIGHT UPPER QUADRANT COMPARISON:  None. FINDINGS: Gallbladder: Multiple mobile gallstones, the largest 1.1 cm. No wall thickening or sonographic Murphy's. Common bile duct: Diameter: Normal caliber, 4 mm Liver: Heterogeneous, increased echotexture suggesting fatty infiltration. No focal hepatic abnormality. Portal vein is patent on color Doppler imaging with normal direction of blood flow towards the liver. IMPRESSION: Cholelithiasis.  No evidence of acute cholecystitis. Suspect mild fatty infiltration of the liver. Electronically Signed   By: Charlett NoseKevin  Dover M.D.   On: 03/13/2019 11:06    Procedures Procedures (including critical care time)  Medications Ordered in ED Medications  alum & mag hydroxide-simeth (MAALOX/MYLANTA) 200-200-20 MG/5ML suspension 30 mL (30 mLs Oral Given 03/13/19 1036)    And  lidocaine (XYLOCAINE) 2 % viscous mouth solution 15 mL (15 mLs Oral Given 03/13/19 1036)  sodium chloride 0.9 % bolus 1,000 mL (1,000 mLs Intravenous New Bag/Given 03/13/19 1130)     Initial Impression / Assessment and Plan / ED Course  I have reviewed the triage vital signs and the nursing notes.  Pertinent labs & imaging results that were available during my care of the patient were reviewed by me and considered in my medical decision making (see chart for details).     HEAR Score: 2  Patient's  "chest pain" is probably more the upper abdominal pain.  Unclear if this is gastritis versus from her gallbladder.  There are stones in her gallbladder but no cholecystitis.  She does feel better with the GI cocktail but not complete relief.  Labs are overall reassuring except there is hyperglycemia.  Given the level, while it is not fasting, it is highly concerning for type 2 diabetes.  She does not have a PCP and would like to start on metformin for now until she can get 1.  I will also give her PPI/H2 blocker.  She will be given hydrocodone for breakthrough pain.  We discussed return precautions.  As far as her hyperglycemia, she does have a mild anion gap but her bicarbonate is 23 and it appears the anion gap is from the low chloride, thus not concerning for DKA.  I did offer to check a urine but she states she wants to go and just urinated without giving a sample.  Given lower concern for DKA I think this is reasonable.  We discussed return precautions.  At the patient's request I did talk to her supervisor, Arlys JohnBrian.  He was wanting a COVID test because she came in with chest pain but she has no covid symptoms, and her complaints are more abdominal. He says she can come back to work without a test.  Maria Morris was evaluated in Emergency Department on 03/13/2019 for the symptoms described in the history of present illness. She was evaluated in the context of the global COVID-19 pandemic, which necessitated consideration that the patient might be at risk for infection with the SARS-CoV-2 virus that causes COVID-19. Institutional protocols and algorithms that pertain to the evaluation of patients at risk for COVID-19 are in a state of rapid change based on information released by regulatory bodies including the CDC and federal and state organizations. These policies and algorithms were followed during the patient's care in the ED.    Final Clinical Impressions(s) / ED Diagnoses   Final diagnoses:   Upper abdominal pain    ED Discharge Orders    None       Pricilla LovelessGoldston, Omnia Dollinger, MD 03/13/19  1240  

## 2019-03-21 NOTE — Progress Notes (Signed)
Patient ID: Maria Morris, female   DOB: 1980-11-04, 38 y.o.   MRN: [id number]  Virtual Visit via Telephone Note  I connected with Maria Morris on 03/22/19 at  2:30 PM EDT by telephone and verified that I am speaking with the correct person using two identifiers.   I discussed the limitations, risks, security and privacy concerns of performing an evaluation and management service by telephone and the availability of in person appointments. I also discussed with the patient that there may be a patient responsible charge related to this service. The patient expressed understanding and agreed to proceed.  Patient location:  home My Location:  [facility] office Persons on the call:  Me and the patient     History of Present Illness: After being seen in the  ED 03/13/2019 for CP.  She was found to have diabetes and cholelithiasis.  Started on metformin and pantoprazole.  She has not made a f/up appt with general surgery yet.  Still having RUQ pain intermittently.  Pain is sharp.  Sometimes lasts 15-20 mins.  No N/V/D.  No fever.  Requests pain meds.  Was given hydrocodone at ED  New diagnosis of DM.  Taking metformin.  Not checking blood sugars/no glucometer.      from ED note:   Patient's "chest pain" is probably more the upper abdominal pain.  Unclear if this is gastritis versus from her gallbladder.  There are stones in her gallbladder but no cholecystitis.  She does feel better with the GI cocktail but not complete relief.  Labs are overall reassuring except there is hyperglycemia.  Given the level, while it is not fasting, it is highly concerning for type 2 diabetes.  She does not have a PCP and would like to start on metformin for now until she can get 1.  I will also give her PPI/H2 blocker.  She will be given hydrocodone for breakthrough pain.  We discussed return precautions.  As far as her hyperglycemia, she does have a mild anion gap but her bicarbonate is 23 and it appears the anion  gap is from the low chloride, thus not concerning for DKA.  I did offer to check a urine but she states she wants to go and just urinated without giving a sample.  Given lower concern for DKA I think this is reasonable.  We discussed return precautions.  At the patient's request I did talk to her supervisor, Aaron Edelman.  He was wanting a COVID test because she came in with chest pain but she has no covid symptoms, and her complaints are more abdominal. He says she can come back to work without a test.  Maria Morris was evaluated in Emergency Department on 03/13/2019 for the symptoms described in the history of present illness. She was evaluated in the context of the global COVID-19 pandemic, which necessitated consideration that the patient might be at risk for infection with the SARS-CoV-2 virus that causes COVID-19. Institutional protocols and algorithms that pertain to the evaluation of patients at risk for COVID-19 are in a state of rapid change based on information released by regulatory bodies including the CDC and federal and state organizations. These policies and algorithms were followed during the patient's care in the ED.     Observations/Objective:  A&Ox3   Assessment and Plan: 1. Type 2 diabetes mellitus with hyperglycemia, without long-term current use of insulin (HCC) check blood sugars fasting and bedtime.  I have had a lengthy discussion and provided education about insulin resistance  and the intake of too much sugar/refined carbohydrates.  I have advised the patient to work at a goal of eliminating sugary drinks, candy, desserts, sweets, refined sugars, processed foods, and white carbohydrates.  The patient expresses understanding.  -A1C - metFORMIN (GLUCOPHAGE) 500 MG tablet; Take 1 tablet (500 mg total) by mouth 2 (two) times daily with a meal.  Dispense: 60 tablet; Refill: 2 - Blood Glucose Monitoring Suppl (TRUE METRIX METER) w/Device KIT; 1 each by Does not apply route 2 (two)  times a day.  Dispense: 1 kit; Refill: 0 - glucose blood (TRUE METRIX BLOOD GLUCOSE TEST) test strip; Use as instructed  Dispense: 100 each; Refill: 12 - TRUEplus Lancets 28G MISC; 1 each by Does not apply route 2 (two) times a day.  Dispense: 100 each; Refill: 1  2. 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  Dispense: 30 tablet; Refill: 2 -call general surgery.  Discussed low fat/low spice/small meals to alleviate gallbladder pain  3. Hospital discharge follow-up To ED if pain returns/worsens/develops fever.    Follow Up Instructions: 3-4 weeks for assign PCP   I discussed the assessment and treatment plan with the patient. The patient was provided an opportunity to ask questions and all were answered. The patient agreed with the plan and demonstrated an understanding of the instructions.   The patient was advised to call back or seek an in-person evaluation if the symptoms worsen or if the condition fails to improve as anticipated.  I provided 15 minutes of non-face-to-face time during this encounter.   Freeman Caldron, PA-C

## 2019-03-22 ENCOUNTER — Ambulatory Visit: Payer: Self-pay | Attending: Family Medicine | Admitting: Physician Assistant

## 2019-03-22 ENCOUNTER — Other Ambulatory Visit: Payer: Self-pay

## 2019-03-22 DIAGNOSIS — E1165 Type 2 diabetes mellitus with hyperglycemia: Secondary | ICD-10-CM

## 2019-03-22 DIAGNOSIS — Z09 Encounter for follow-up examination after completed treatment for conditions other than malignant neoplasm: Secondary | ICD-10-CM

## 2019-03-22 DIAGNOSIS — K8080 Other cholelithiasis without obstruction: Secondary | ICD-10-CM

## 2019-03-22 MED ORDER — PANTOPRAZOLE SODIUM 40 MG PO TBEC: 40.0000 mg | DELAYED_RELEASE_TABLET | Freq: Every day | ORAL | 2 refills | Status: DC

## 2019-03-22 MED ORDER — TRUE METRIX METER W/DEVICE KIT: 1.0000 | PACK | Freq: Two times a day (BID) | 0 refills | Status: AC

## 2019-03-22 MED ORDER — TRUEPLUS LANCETS 28G MISC: 1.0000 | Freq: Two times a day (BID) | 1 refills | Status: AC

## 2019-03-22 MED ORDER — METFORMIN HCL 500 MG PO TABS: 500.0000 mg | ORAL_TABLET | Freq: Two times a day (BID) | ORAL | 2 refills | Status: DC

## 2019-03-22 MED ORDER — TRUE METRIX BLOOD GLUCOSE TEST VI STRP: ORAL_STRIP | 12 refills | Status: AC

## 2019-03-22 MED FILL — TRUEplus LANCETS 28G MISC: 50 days supply | Qty: 100 | Fill #0

## 2019-03-22 MED FILL — !TRUE METRIX BLOOD GLUCOSE: 365 days supply | Qty: 1 | Fill #0

## 2019-03-22 MED FILL — PANTOPRAZOLE SOD DR 40 MG T: 40 | 30 days supply | Qty: 30 | Fill #0

## 2019-03-22 MED FILL — TRUE METRIX TEST STRIP: 50 days supply | Qty: 100 | Fill #0

## 2019-03-22 MED FILL — metFORMIN HCL 500 MG TABS: 500 | 30 days supply | Qty: 60 | Fill #0

## 2019-04-04 MED FILL — IBUPROFEN 800 MG TABLET: 800 | 10 days supply | Qty: 30 | Fill #0

## 2019-04-20 ENCOUNTER — Other Ambulatory Visit: Payer: Self-pay

## 2019-04-20 ENCOUNTER — Ambulatory Visit: Payer: Self-pay | Attending: Family Medicine | Admitting: Family Medicine

## 2019-04-20 ENCOUNTER — Encounter: Payer: Self-pay | Admitting: Family Medicine

## 2019-04-20 VITALS — BP 143/84 | HR 60 | Temp 98.8°F | Resp 18 | Ht 67.0 in | Wt 332.0 lb

## 2019-04-20 DIAGNOSIS — E1142 Type 2 diabetes mellitus with diabetic polyneuropathy: Secondary | ICD-10-CM

## 2019-04-20 DIAGNOSIS — E1165 Type 2 diabetes mellitus with hyperglycemia: Secondary | ICD-10-CM

## 2019-04-20 DIAGNOSIS — K8020 Calculus of gallbladder without cholecystitis without obstruction: Secondary | ICD-10-CM

## 2019-04-20 LAB — POCT GLYCOSYLATED HEMOGLOBIN (HGB A1C): Hemoglobin A1C: 10 % — AB (ref 4.0–5.6)

## 2019-04-20 LAB — POCT UA - MICROALBUMIN

## 2019-04-20 LAB — GLUCOSE, POCT (MANUAL RESULT ENTRY): POC Glucose: 301 mg/dL — AB (ref 70–99)

## 2019-04-20 MED ORDER — GABAPENTIN 300 MG PO CAPS: 600.0000 mg | ORAL_CAPSULE | Freq: Three times a day (TID) | ORAL | 3 refills | Status: DC

## 2019-04-20 MED ORDER — GLIPIZIDE 10 MG PO TABS: 10.0000 mg | ORAL_TABLET | Freq: Two times a day (BID) | ORAL | 3 refills | Status: DC

## 2019-04-20 MED FILL — GABAPENTIN 300 MG CAPSULE: 300 | 30 days supply | Qty: 180 | Fill #0

## 2019-04-20 MED FILL — glipiZIDE 10 MG TABS: 10 | 30 days supply | Qty: 60 | Fill #0

## 2019-04-20 NOTE — Patient Instructions (Signed)
Blood Glucose Monitoring, Adult °Monitoring your blood sugar (glucose) is an important part of managing your diabetes (diabetes mellitus). Blood glucose monitoring involves checking your blood glucose as often as directed and keeping a record (log) of your results over time. °Checking your blood glucose regularly and keeping a blood glucose log can: °· Help you and your health care provider adjust your diabetes management plan as needed, including your medicines or insulin. °· Help you understand how food, exercise, illnesses, and medicines affect your blood glucose. °· Let you know what your blood glucose is at any time. You can quickly find out if you have low blood glucose (hypoglycemia) or high blood glucose (hyperglycemia). °Your health care provider will set individualized treatment goals for you. Your goals will be based on your age, other medical conditions you have, and how you respond to diabetes treatment. Generally, the goal of treatment is to maintain the following blood glucose levels: °· Before meals (preprandial): 80-130 mg/dL (4.4-7.2 mmol/L). °· After meals (postprandial): below 180 mg/dL (10 mmol/L). °· A1c level: less than 7%. °Supplies needed: °· Blood glucose meter. °· Test strips for your meter. Each meter has its own strips. You must use the strips that came with your meter. °· A needle to prick your finger (lancet). Do not use a lancet more than one time. °· A device that holds the lancet (lancing device). °· A journal or log book to write down your results. °How to check your blood glucose ° °1. Wash your hands with soap and water. °2. Prick the side of your finger (not the tip) with the lancet. Use a different finger each time. °3. Gently rub the finger until a small drop of blood appears. °4. Follow instructions that come with your meter for inserting the test strip, applying blood to the strip, and using your blood glucose meter. °5. Write down your result and any notes. °Some meters  allow you to use areas of your body other than your finger (alternative sites) to test your blood. The most common alternative sites are: °· Forearm. °· Thigh. °· Palm of the hand. °If you think you may have hypoglycemia, or if you have a history of not knowing when your blood glucose is getting low (hypoglycemia unawareness), do not use alternative sites. Use your finger instead. Alternative sites may not be as accurate as the fingers, because blood flow is slower in these areas. This means that the result you get may be delayed, and it may be different from the result that you would get from your finger. °Follow these instructions at home: °Blood glucose log ° °· Every time you check your blood glucose, write down your result. Also write down any notes about things that may be affecting your blood glucose, such as your diet and exercise for the day. This information can help you and your health care provider: °? Look for patterns in your blood glucose over time. °? Adjust your diabetes management plan as needed. °· Check if your meter allows you to download your records to a computer. Most glucose meters store a record of glucose readings in the meter. °If you have type 1 diabetes: °· Check your blood glucose 2 or more times a day. °· Also check your blood glucose: °? Before every insulin injection. °? Before and after exercise. °? Before meals. °? 2 hours after a meal. °? Occasionally between 2:00 a.m. and 3:00 a.m., as directed. °? Before potentially dangerous tasks, like driving or using heavy machinery. °?   At bedtime. °· You may need to check your blood glucose more often, up to 6-10 times a day, if you: °? Use an insulin pump. °? Need multiple daily injections (MDI). °? Have diabetes that is not well-controlled. °? Are ill. °? Have a history of severe hypoglycemia. °? Have hypoglycemia unawareness. °If you have type 2 diabetes: °· If you take insulin or other diabetes medicines, check your blood glucose 2 or  more times a day. °· If you are on intensive insulin therapy, check your blood glucose 4 or more times a day. Occasionally, you may also need to check between 2:00 a.m. and 3:00 a.m., as directed. °· Also check your blood glucose: °? Before and after exercise. °? Before potentially dangerous tasks, like driving or using heavy machinery. °· You may need to check your blood glucose more often if: °? Your medicine is being adjusted. °? Your diabetes is not well-controlled. °? You are ill. °General tips °· Always keep your supplies with you. °· If you have questions or need help, all blood glucose meters have a 24-hour "hotline" phone number that you can call. You may also contact your health care provider. °· After you use a few boxes of test strips, adjust (calibrate) your blood glucose meter by following instructions that came with your meter. °Contact a health care provider if: °· Your blood glucose is at or above 240 mg/dL (13.3 mmol/L) for 2 days in a row. °· You have been sick or have had a fever for 2 days or longer, and you are not getting better. °· You have any of the following problems for more than 6 hours: °? You cannot eat or drink. °? You have nausea or vomiting. °? You have diarrhea. °Get help right away if: °· Your blood glucose is lower than 54 mg/dL (3 mmol/L). °· You become confused or you have trouble thinking clearly. °· You have difficulty breathing. °· You have moderate or large ketone levels in your urine. °Summary °· Monitoring your blood sugar (glucose) is an important part of managing your diabetes (diabetes mellitus). °· Blood glucose monitoring involves checking your blood glucose as often as directed and keeping a record (log) of your results over time. °· Your health care provider will set individualized treatment goals for you. Your goals will be based on your age, other medical conditions you have, and how you respond to diabetes treatment. °· Every time you check your blood glucose,  write down your result. Also write down any notes about things that may be affecting your blood glucose, such as your diet and exercise for the day. °This information is not intended to replace advice given to you by your health care provider. Make sure you discuss any questions you have with your health care provider. °Document Released: 08/12/2003 Document Revised: 06/02/2018 Document Reviewed: 01/19/2016 °Elsevier Patient Education © 2020 [employer] ° °

## 2019-04-20 NOTE — Progress Notes (Signed)
Established Patient Office Visit  Subjective:  Patient ID: Maria Morris, female    DOB: [birth year]  Age: 38 y.o. MRN: [id number]  CC:  Chief Complaint  Patient presents with  . Diabetes    HPI Maria Morris presents to establish for ongoing medical care status post 03/13/2019 visit to the emergency department for chest pain at which time patient was diagnosed with gallstones and diabetes.  Patient reports that she has been taking Metformin and his medication has caused some initial GI side effects.  She reports no current severe issues with right upper quadrant pain/abdominal pain related to her gallbladder but admits that she is not completely made necessary diet changes to avoid having GI symptoms.  She also is having difficulty with a low carbohydrate diet and completely given up soda.  She is trying to make changes in her diet.  She continues to have some frequent urination and increased thirst.  She is not yet scheduled follow-up appointment with general surgery regarding gallstones.  She also reports that she is has issues with sharp stinging and burning sensation in her feet which usually worsens at night or if she has been walking or standing.  Pain ranges from about a 6 to close to a 10 on a 0-to-10 scale.  Past Medical History:  Diagnosis Date  . Anemia   . Diabetic neuropathy ([place])   . Type 2 diabetes mellitus ([place]) 2014    Past Surgical History:  Procedure Laterality Date  . NO PAST SURGERIES      Family History  Problem Relation Age of Onset  . Diabetes Mother   . Diabetes Father   . Hypertension Father   . Heart disease Father   . Cancer Neg Hx     Social History   Socioeconomic History  . Marital status: Widowed    Spouse name: Not on file  . Number of children: Not on file  . Years of education: Not on file  . Highest education level: Not on file  Occupational History  . Not on file  Social Needs  . Financial resource strain: Not on file  .  Food insecurity    Worry: Not on file    Inability: Not on file  . Transportation needs    Medical: Not on file    Non-medical: Not on file  Tobacco Use  . Smoking status: Never Smoker  . Smokeless tobacco: Never Used  Substance and Sexual Activity  . Alcohol use: Yes  . Drug use: No  . Sexual activity: Not Currently  Lifestyle  . Physical activity    Days per week: Not on file    Minutes per session: Not on file  . Stress: Not on file  Relationships  . Social Herbalist on phone: Not on file    Gets together: Not on file    Attends religious service: Not on file    Active member of club or organization: Not on file    Attends meetings of clubs or organizations: Not on file    Relationship status: Not on file  . Intimate partner violence    Fear of current or ex partner: Not on file    Emotionally abused: Not on file    Physically abused: Not on file    Forced sexual activity: Not on file  Other Topics Concern  . Not on file  Social History Narrative  . Not on file    Outpatient Medications Prior to Visit  Medication Sig Dispense Refill  . acetaminophen (TYLENOL) 500 MG tablet Take 1,000-2,000 mg by mouth 3 (three) times daily as needed for headache (migraine).    Marland Kitchen aspirin-sod bicarb-citric acid (ALKA-SELTZER) 325 MG TBEF tablet Take 325 mg by mouth every 6 (six) hours as needed (indigestion).    . bismuth subsalicylate (PEPTO BISMOL) 262 MG/15ML suspension Take 30 mLs by mouth every 6 (six) hours as needed for indigestion.    . Blood Glucose Monitoring Suppl (TRUE METRIX METER) w/Device KIT 1 each by Does not apply route 2 (two) times a day. 1 kit 0  . famotidine (PEPCID) 20 MG tablet Take 1 tablet (20 mg total) by mouth 2 (two) times daily. 30 tablet 0  . glucose blood (TRUE METRIX BLOOD GLUCOSE TEST) test strip Use as instructed 100 each 12  . metFORMIN (GLUCOPHAGE) 500 MG tablet Take 1 tablet (500 mg total) by mouth 2 (two) times daily with a meal. 60  tablet 2  . pantoprazole (PROTONIX) 40 MG tablet Take 1 tablet (40 mg total) by mouth daily. 30 tablet 2  . TRUEplus Lancets 28G MISC 1 each by Does not apply route 2 (two) times a day. 100 each 1  . HYDROcodone-acetaminophen (NORCO) 5-325 MG tablet Take 1 tablet by mouth every 4 (four) hours as needed for severe pain. 10 tablet 0   No facility-administered medications prior to visit.     No Known Allergies  ROS Review of Systems  Constitutional: Positive for fatigue. Negative for chills and fever.  HENT: Negative for sore throat and trouble swallowing.   Eyes: Negative for photophobia.  Respiratory: Negative for cough and shortness of breath.   Cardiovascular: Negative for chest pain, palpitations and leg swelling.  Gastrointestinal: Positive for abdominal pain (Occasional but not as bad as upon initial ED visit). Negative for blood in stool, constipation, diarrhea and nausea.  Endocrine: Positive for polydipsia, polyphagia and polyuria. Negative for cold intolerance and heat intolerance.  Genitourinary: Positive for frequency. Negative for dysuria.  Musculoskeletal: Positive for gait problem (Discomfort in her feet). Negative for arthralgias and back pain.  Neurological: Positive for numbness. Negative for dizziness and headaches.  Hematological: Negative for adenopathy. Does not bruise/bleed easily.  Psychiatric/Behavioral: Negative for self-injury and suicidal ideas.      Objective:    Physical Exam  Constitutional: She is oriented to person, place, and time. She appears well-developed and well-nourished.  Well-nourished well-developed obese female in no acute distress  Neck: Normal range of motion. Neck supple. No JVD present.  Cardiovascular: Normal rate and regular rhythm.  Pulmonary/Chest: Effort normal and breath sounds normal.  Abdominal: There is abdominal tenderness (Mild right upper quadrant tenderness with palpation). There is no rebound and no guarding.    Musculoskeletal:        General: No tenderness or edema.     Comments: No CVA tenderness  Lymphadenopathy:    She has no cervical adenopathy.  Neurological: She is alert and oriented to person, place, and time.  Skin: Skin is warm and dry.  Psychiatric: Her behavior is normal.  Nursing note and vitals reviewed. Diabetic foot exam-no active skin breakdown on the feet, decreased sensation on monofilament exam especially on the heels and balls of the feet, 1+ posterior tibial and dorsalis pedis pulses bilaterally.  BP (!) 143/84 (BP Location: Left Arm, Patient Position: Sitting, Cuff Size: Large)   Pulse 60   Temp 98.8 F (37.1 C) (Oral)   Resp 18   Ht '5\' 7"'  (1.702 m)  Wt (!) 332 lb (150.6 kg)   SpO2 100%   BMI 52.00 kg/m  Wt Readings from Last 3 Encounters:  04/20/19 (!) 332 lb (150.6 kg)  03/13/19 (!) 330 lb (149.7 kg)  07/20/17 260 lb (117.9 kg)     Health Maintenance Due  Topic Date Due  . HIV Screening  08/29/1997  . TETANUS/TDAP  08/29/2001  . PAP SMEAR-Modifier  08/30/2003  . INFLUENZA VACCINE  03/24/2019    No results found for: TSH Lab Results  Component Value Date   WBC 8.7 06/04/2019   HGB 10.7 (L) 06/04/2019   HCT 33.2 (L) 06/04/2019   MCV 90.7 06/04/2019   PLT 201 06/04/2019   Lab Results  Component Value Date   NA 142 06/04/2019   K 3.7 06/04/2019   CO2 26 06/04/2019   GLUCOSE 196 (H) 06/04/2019   BUN 10 06/04/2019   CREATININE 0.88 06/04/2019   BILITOT 0.6 06/04/2019   ALKPHOS 61 06/04/2019   AST 28 06/04/2019   ALT 21 06/04/2019   PROT 5.5 (L) 06/04/2019   ALBUMIN 2.9 (L) 06/04/2019   CALCIUM 8.5 (L) 06/04/2019   ANIONGAP 9 06/04/2019   No results found for: CHOL No results found for: HDL No results found for: LDLCALC No results found for: TRIG No results found for: CHOLHDL Lab Results  Component Value Date   HGBA1C 10.0 (A) 04/20/2019      Assessment & Plan:  1. Type 2 diabetes mellitus with hyperglycemia, without long-term  current use of insulin (HCC) Patient's blood sugar today's visit was still elevated.  Blood sugar of 301 postprandial.  Patient's blood sugar at her emergency department visit in July was 337.  Patient was started on metformin at her emergency department visit but here at the office her blood sugars remain elevated.  We will add glipizide 10 mg twice daily and have patient continue use of metformin.  Patient with hemoglobin A1c of 10.0 reflecting that patient has had blood sugars that have remained greater than 200 over the last 90 days on average.  Patient will also have urinalysis to check for microalbumin at today's visit.  She is encouraged to start monitoring blood sugars at home as well as becoming compliant with a diabetic/low carbohydrate diet.  She has been asked to call with blood sugar results in the next 2 weeks to see if additional changes are needed.  She may also benefit from referral to see the clinical pharmacist regarding her blood sugars.  She has been asked to follow-up in approximately 6 weeks.  Information on blood glucose monitoring provided as part of after visit summary-AVS.  Patient encouraged to complete application for financial assistance at this clinic for help with the cost of ongoing and future medical care. - POCT A1C - Glucose (CBG) - glipiZIDE (GLUCOTROL) 10 MG tablet; Take 1 tablet (10 mg total) by mouth 2 (two) times daily before a meal.  Dispense: 60 tablet; Refill: 3 - POCT UA - Microalbumin  2. Diabetic polyneuropathy associated with type 2 diabetes mellitus ([place]) Patient with complaint of numbness and tingling in her feet consistent with diabetic neuropathy and patient with abnormal monofilament exam with decreased sensation.  Prescription provided patient with recent new diagnosis of diabetes in July but has likely had diabetes for much longer.  Prescription provided for gabapentin to start at 300 mg at bedtime and then she may increase to taking 3 times daily.  If  he is able to tolerate this dose without any significant  side effects such as dizziness or significant drowsiness then she can further increase the dose to 600 mg or 2 pills 3 times daily if needed. - gabapentin (NEURONTIN) 300 MG capsule; Take 2 capsules (600 mg total) by mouth 3 (three) times daily.  Dispense: 180 capsule; Refill: 3  3. Gallstones Patient was diagnosed with gallstones at ED visit 03/13/2019.  She is encouraged to follow a bland diet avoiding spicy or greasy foods.  She also needs to schedule appointment and follow-up referral from emergency department to be seen by general surgery regarding removal of her gallbladder.  Discussed importance of having blood sugars controlled prior to surgery as elevated blood sugars can complicate postsurgical healing.  Meds ordered this encounter  Medications  . gabapentin (NEURONTIN) 300 MG capsule    Sig: Take 2 capsules (600 mg total) by mouth 3 (three) times daily.    Dispense:  180 capsule    Refill:  3  . glipiZIDE (GLUCOTROL) 10 MG tablet    Sig: Take 1 tablet (10 mg total) by mouth 2 (two) times daily before a meal.    Dispense:  60 tablet    Refill:  3   An After Visit Summary was printed and given to the patient.  Follow-up: Follow-up in 6 weeks   Emony Dormer, MD

## 2019-05-01 MED FILL — metFORMIN HCL 500 MG TABS: 500 | 30 days supply | Qty: 60 | Fill #1

## 2019-05-02 ENCOUNTER — Telehealth: Payer: Self-pay | Admitting: General Practice

## 2019-05-02 NOTE — Telephone Encounter (Signed)
Patient called for her results. Please follow up.

## 2019-05-03 MED FILL — TRUE METRIX TEST STRIP: 25 days supply | Qty: 100 | Fill #1

## 2019-05-07 ENCOUNTER — Telehealth: Payer: Self-pay | Admitting: General Practice

## 2019-05-07 NOTE — Telephone Encounter (Signed)
Rx was last filled on 04/20/19 at our pharmacy. Pt is 13 days too early for a refill.

## 2019-05-07 NOTE — Telephone Encounter (Signed)
New message   1) Medication(s) Requested (by name): Gabapentin   2) Pharmacy of Choice:[facility]  3) Special Requests: Pt states her dosage was increased and is requesting a refill with updated dosage   Approved medications will be sent to the pharmacy, we will reach out if there is an issue.  Requests made after 3pm may not be addressed until the following business day!  If a patient is unsure of the name of the medication(s) please note and ask patient to call back when they are able to provide all info, do not send to responsible party until all information is available!gabapentin

## 2019-05-14 MED FILL — PANTOPRAZOLE SOD DR 40 MG T: 40 | 30 days supply | Qty: 30 | Fill #1

## 2019-05-18 MED FILL — GABAPENTIN 300 MG CAPSULE: 300 | 30 days supply | Qty: 180 | Fill #1

## 2019-05-21 MED FILL — glipiZIDE 10 MG TABS: 10 | 30 days supply | Qty: 60 | Fill #1

## 2019-06-04 ENCOUNTER — Emergency Department (HOSPITAL_COMMUNITY): Payer: Self-pay

## 2019-06-04 ENCOUNTER — Encounter (HOSPITAL_COMMUNITY): Payer: Self-pay

## 2019-06-04 ENCOUNTER — Emergency Department (HOSPITAL_COMMUNITY)
Admission: EM | Admit: 2019-06-04 | Discharge: 2019-06-04 | Disposition: A | Discharge status: Home / Self Care | Payer: Self-pay | Attending: Emergency Medicine | Admitting: Emergency Medicine

## 2019-06-04 ENCOUNTER — Other Ambulatory Visit: Payer: Self-pay

## 2019-06-04 DIAGNOSIS — Z20828 Contact with and (suspected) exposure to other viral communicable diseases: Secondary | ICD-10-CM | POA: Insufficient documentation

## 2019-06-04 DIAGNOSIS — Z7984 Long term (current) use of oral hypoglycemic drugs: Secondary | ICD-10-CM | POA: Insufficient documentation

## 2019-06-04 DIAGNOSIS — R1011 Right upper quadrant pain: Secondary | ICD-10-CM | POA: Insufficient documentation

## 2019-06-04 DIAGNOSIS — Z79899 Other long term (current) drug therapy: Secondary | ICD-10-CM | POA: Insufficient documentation

## 2019-06-04 DIAGNOSIS — E119 Type 2 diabetes mellitus without complications: Secondary | ICD-10-CM | POA: Insufficient documentation

## 2019-06-04 LAB — COMPREHENSIVE METABOLIC PANEL
ALT: 21 U/L (ref 0–44)
AST: 28 U/L (ref 15–41)
Albumin: 2.9 g/dL — ABNORMAL LOW (ref 3.5–5.0)
Alkaline Phosphatase: 61 U/L (ref 38–126)
Anion gap: 9 (ref 5–15)
BUN: 10 mg/dL (ref 6–20)
CO2: 26 mmol/L (ref 22–32)
Calcium: 8.5 mg/dL — ABNORMAL LOW (ref 8.9–10.3)
Chloride: 107 mmol/L (ref 98–111)
Creatinine, Ser: 0.88 mg/dL (ref 0.44–1.00)
GFR calc Af Amer: >60 mL/min (ref >60)
GFR calc non Af Amer: >60 mL/min (ref >60)
Glucose, Bld: 196 mg/dL — ABNORMAL HIGH (ref 70–99)
Potassium: 3.7 mmol/L (ref 3.5–5.1)
Sodium: 142 mmol/L (ref 135–145)
Total Bilirubin: 0.6 mg/dL (ref 0.3–1.2)
Total Protein: 5.5 g/dL — ABNORMAL LOW (ref 6.5–8.1)

## 2019-06-04 LAB — I-STAT BETA HCG BLOOD, ED (MC, WL, AP ONLY): I-stat hCG, quantitative: <5 m[IU]/mL (ref <5)

## 2019-06-04 LAB — CBC
HCT: 33.2 % — ABNORMAL LOW (ref 36.0–46.0)
Hemoglobin: 10.7 g/dL — ABNORMAL LOW (ref 12.0–15.0)
MCH: 29.2 pg (ref 26.0–34.0)
MCHC: 32.2 g/dL (ref 30.0–36.0)
MCV: 90.7 fL (ref 80.0–100.0)
Platelets: 201 10*3/uL (ref 150–400)
RBC: 3.66 MIL/uL — ABNORMAL LOW (ref 3.87–5.11)
RDW: 14 % (ref 11.5–15.5)
WBC: 8.7 10*3/uL (ref 4.0–10.5)
nRBC: 0 % (ref 0.0–0.2)

## 2019-06-04 LAB — LIPASE, BLOOD: Lipase: 48 U/L (ref 11–51)

## 2019-06-04 LAB — SARS CORONAVIRUS 2 BY RT PCR (HOSPITAL ORDER, PERFORMED IN ~~LOC~~ HOSPITAL LAB): SARS Coronavirus 2: NEGATIVE

## 2019-06-04 IMAGING — US US ABDOMEN LIMITED
1 series · 14 of 25 positions shown · non-contrast
Comparison: None.

CLINICAL DATA: Right upper quadrant pain

EXAM:
ULTRASOUND ABDOMEN LIMITED RIGHT UPPER QUADRANT

[Series 1: us abdomen limited · 14 of 45 slices shown]
[im 1/45]
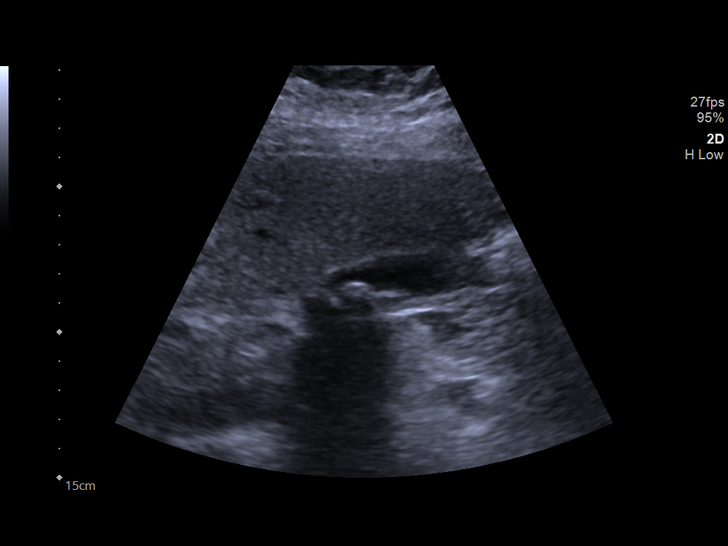
[im 4/45]
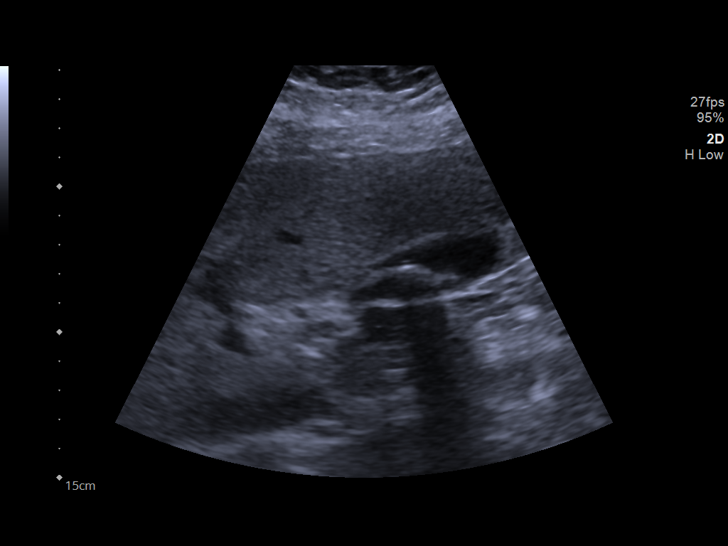
[im 8/45]
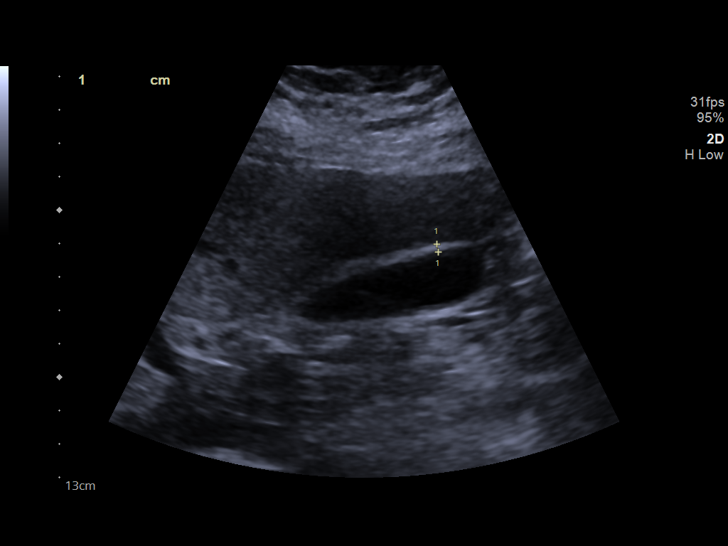
[im 12/45]
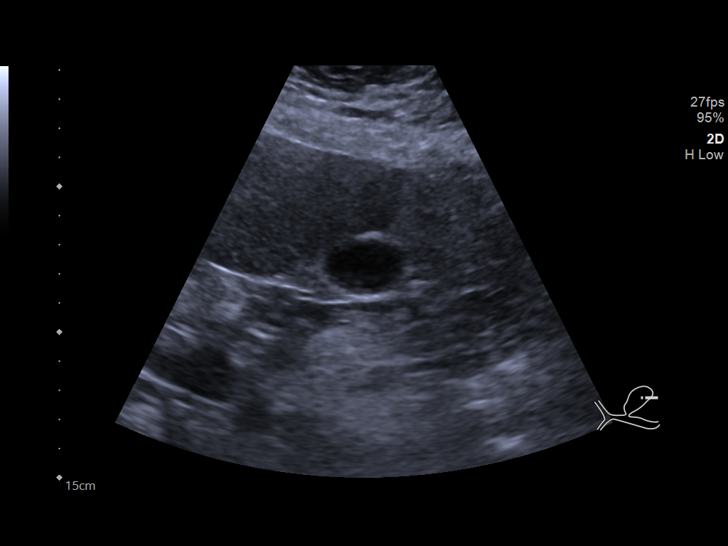
[im 15/45]
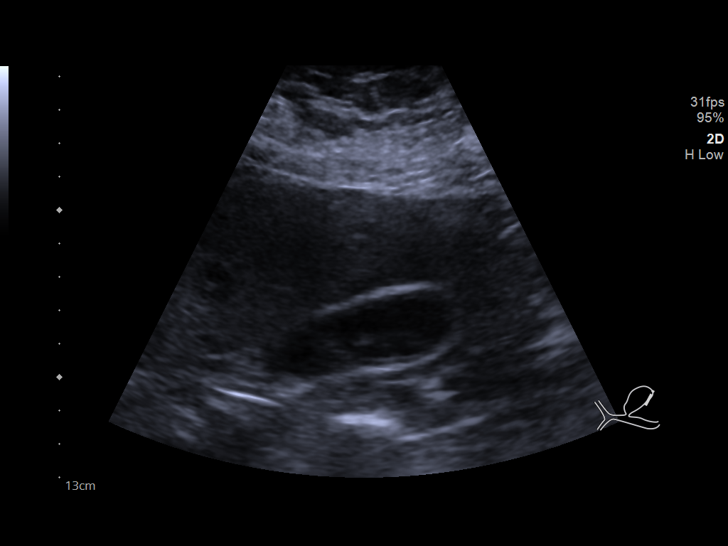
[im 17/45]
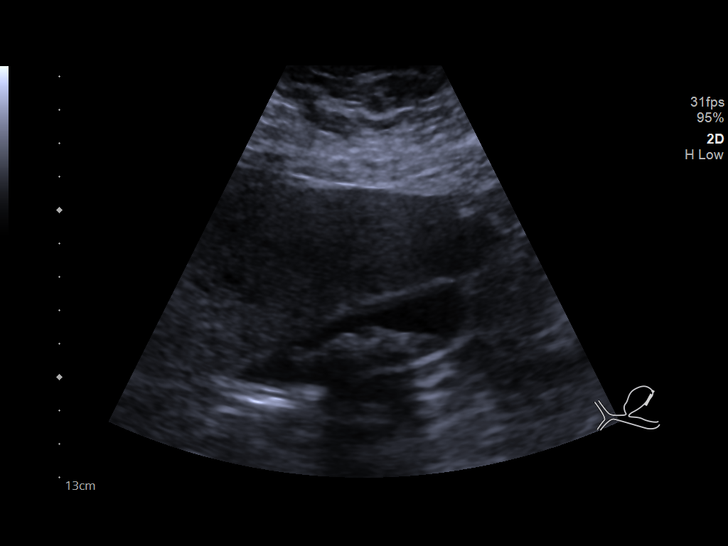
[im 21/45]
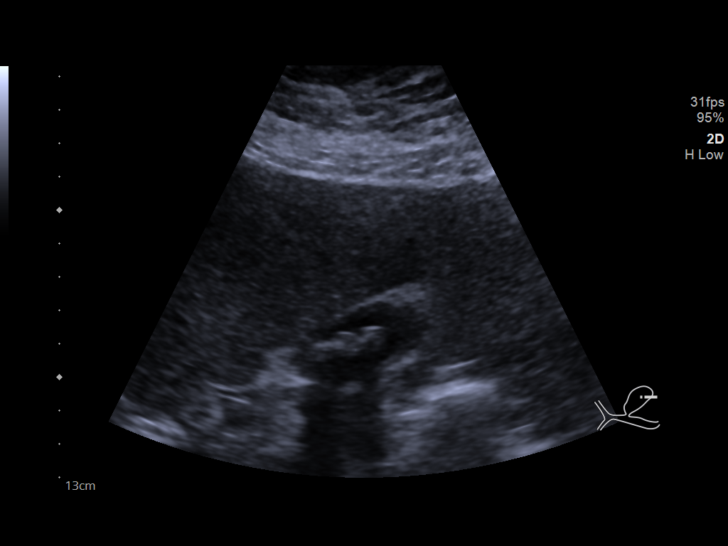
[im 24/45]
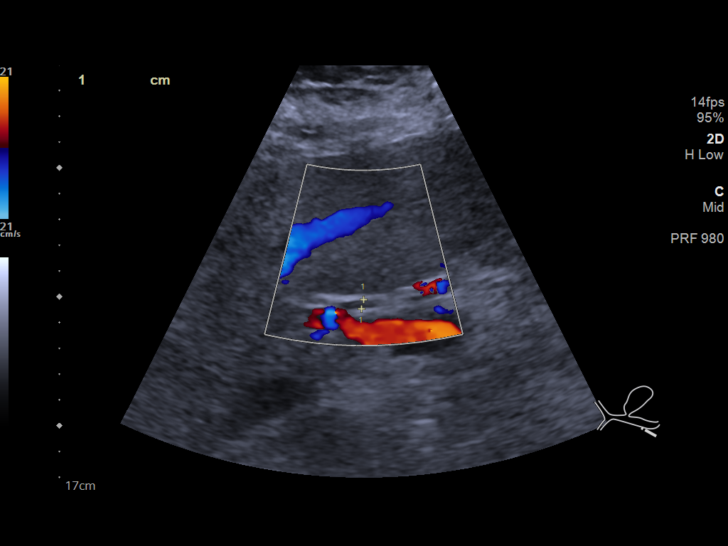
[im 28/45]
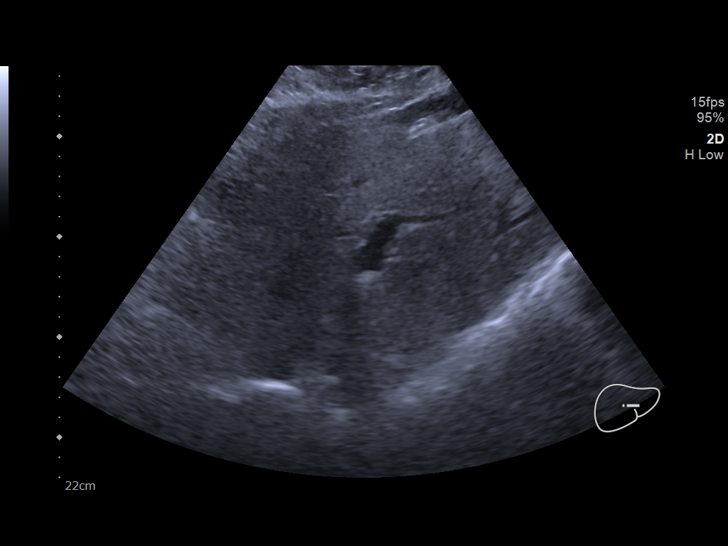
[im 30/45]
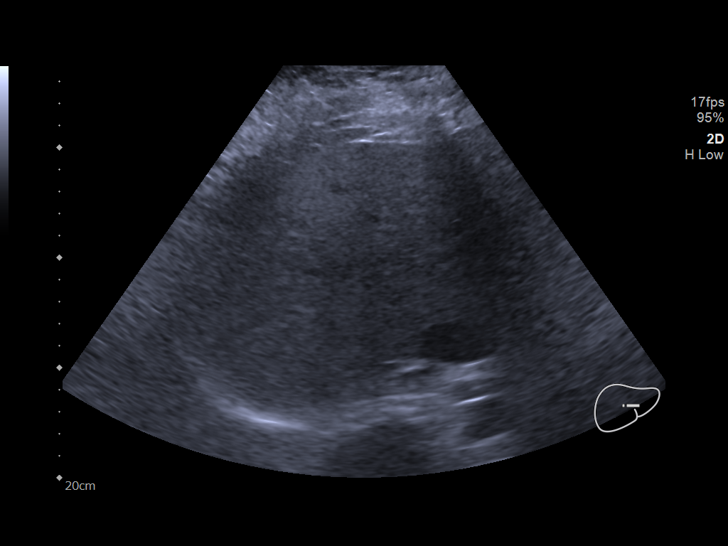
[im 34/45]
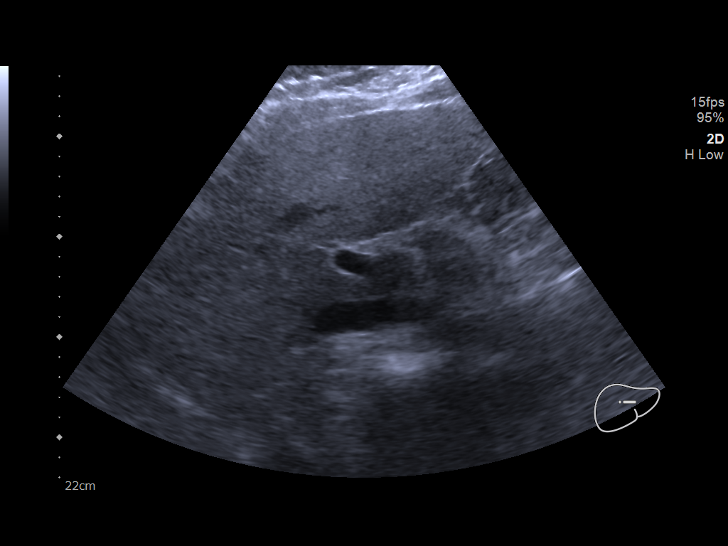
[im 37/45]
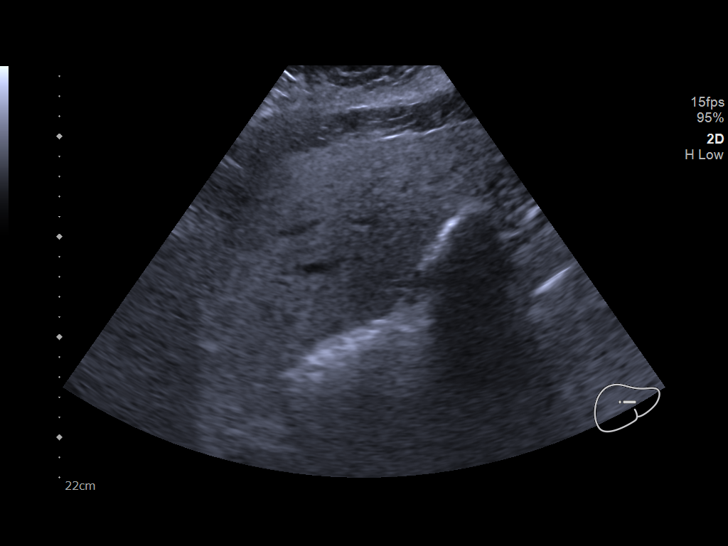
[im 41/45]
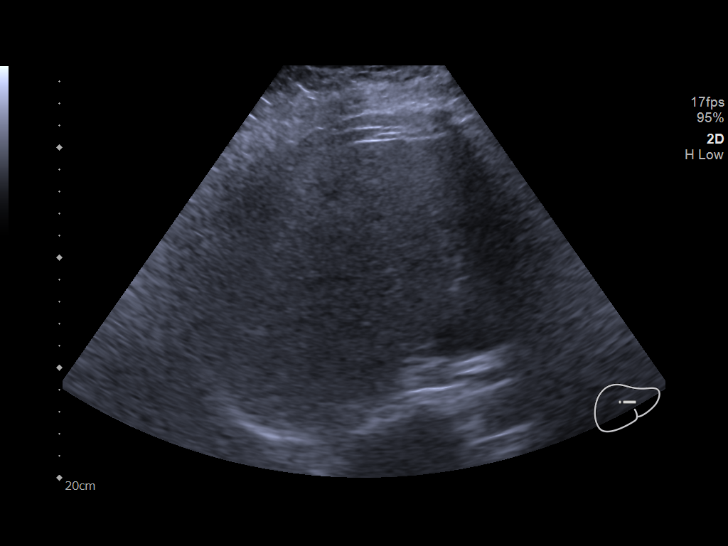
[im 45/45]
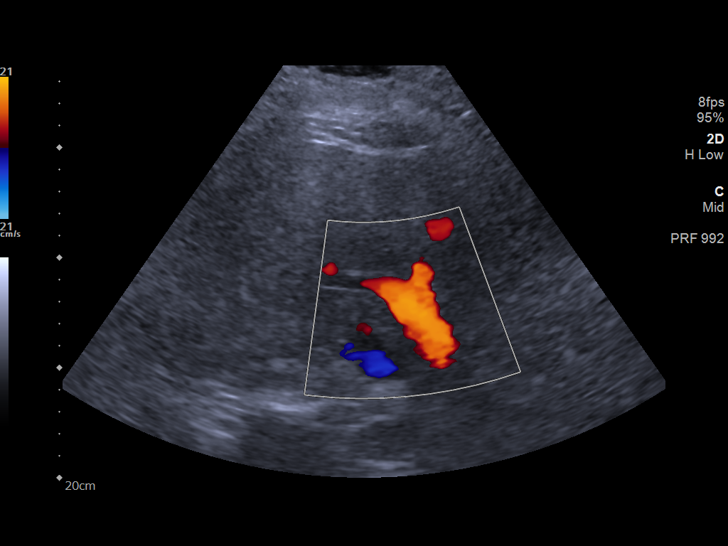

[14 of 25 positions shown; findings below may reference images not displayed]

FINDINGS: Gallbladder:

Within the gallbladder, there are echogenic foci which move and
shadow consistent with cholelithiasis. Largest gallstone measures
1.1 cm in length. There is no gallbladder wall thickening or
pericholecystic fluid. No sonographic Murphy sign noted by
sonographer.

Common bile duct:

Diameter: 4 mm. No intrahepatic or extrahepatic biliary duct
dilatation.

Liver:

No focal lesion identified. Liver echogenicity overall is increased.
Portal vein is patent on color Doppler imaging with normal direction
of blood flow towards the liver.

Other: None.
IMPRESSION: 1. Cholelithiasis. No gallbladder wall thickening or pericholecystic
fluid.

2. Diffuse increased liver echogenicity, a finding indicative of
hepatic steatosis. No focal liver lesions are evident. Note that the
sensitivity of ultrasound for detection of focal liver lesions is
somewhat diminished in this circumstance.

## 2019-06-04 MED ORDER — MORPHINE SULFATE (PF) 4 MG/ML IV SOLN
4.0000 mg | Freq: Once | INTRAVENOUS | Status: AC
  Administered 2019-06-04: 16:00:00 4 mg via INTRAVENOUS
  Filled 2019-06-04: qty 1

## 2019-06-04 MED ORDER — OXYCODONE-ACETAMINOPHEN 5-325 MG PO TABS: 1.0000 | ORAL_TABLET | Freq: Four times a day (QID) | ORAL | 0 refills | Status: DC | PRN

## 2019-06-04 MED ORDER — SODIUM CHLORIDE 0.9% FLUSH
3.0000 mL | Freq: Once | INTRAVENOUS | Status: AC
  Administered 2019-06-04: 16:00:00 3 mL via INTRAVENOUS

## 2019-06-04 NOTE — Discharge Instructions (Signed)
Please read attached information. If you experience any new or worsening signs or symptoms please return to the emergency room for evaluation. Please follow-up with your primary care provider or specialist as discussed. Please use medication prescribed only as directed and discontinue taking if you have any concerning signs or symptoms.   °

## 2019-06-04 NOTE — ED Notes (Signed)
Patient transported to us 

## 2019-06-04 NOTE — ED Notes (Signed)
Pt verbalized understanding of discharge instructions and denies any further questions at this time.   

## 2019-06-04 NOTE — ED Provider Notes (Signed)
[facility] Provider Note   CSN: [id number] Arrival date & time: 06/04/19  1132    History   Chief Complaint Chief Complaint  Patient presents with  . ongoing gallbladder pain    HPI Maria Morris is a 38 y.o. female.     HPI   38 year old female presents today with complaints of right upper quadrant abdominal pain.  Patient has a several month history of right upper quadrant abdominal pain.  She was most recently seen on 03/13/2019 with pain in the right upper quadrant.  She had an ultrasound showing cholelithiasis.  She was discharged home and follow-up as an outpatient with general surgery.  She notes she was supposed to schedule a surgery for removal of her gallbladder but due to an antral concerns she did not make this appointment.  She notes ongoing pain since that time that has progressively worsened.  She notes she has severe right upper quadrant pain with eating.  She notes her last p.o. was this morning around 930 having ham and eggs.  She notes on and off fever but none today.  She notes 2 days ago she had several episodes of vomiting throughout the day, but has had no vomiting since that time.  Past Medical History:  Diagnosis Date  . Anemia   . Diabetic neuropathy ([place])   . Type 2 diabetes mellitus ([place]) 2014    There are no active problems to display for this patient.   Past Surgical History:  Procedure Laterality Date  . NO PAST SURGERIES       OB History   No obstetric history on file.      Home Medications    Prior to Admission medications   Medication Sig Start Date End Date Taking? Authorizing Provider  acetaminophen (TYLENOL) 500 MG tablet Take 1,000-2,000 mg by mouth 3 (three) times daily as needed for headache (migraine).    [provider]  aspirin-sod bicarb-citric acid (ALKA-SELTZER) 325 MG TBEF tablet Take 325 mg by mouth every 6 (six) hours as needed (indigestion).    [provider]  bismuth subsalicylate (PEPTO BISMOL) 262 MG/15ML suspension Take 30 mLs by mouth every 6 (six) hours as needed for indigestion.    [provider]  Blood Glucose Monitoring Suppl (TRUE METRIX METER) w/Device KIT 1 each by Does not apply route 2 (two) times a day. 03/22/19   Argentina Donovan, PA-C  famotidine (PEPCID) 20 MG tablet Take 1 tablet (20 mg total) by mouth 2 (two) times daily. 03/13/19   Sherwood Gambler, MD  gabapentin (NEURONTIN) 300 MG capsule Take 2 capsules (600 mg total) by mouth 3 (three) times daily. 04/20/19   Fulp, Cammie, MD  glipiZIDE (GLUCOTROL) 10 MG tablet Take 1 tablet (10 mg total) by mouth 2 (two) times daily before a meal. 04/20/19   Fulp, Cammie, MD  glucose blood (TRUE METRIX BLOOD GLUCOSE TEST) test strip Use as instructed 03/22/19   Freeman Caldron M, PA-C  metFORMIN (GLUCOPHAGE) 500 MG tablet Take 1 tablet (500 mg total) by mouth 2 (two) times daily with a meal. 03/22/19   McClung, Dionne Bucy, PA-C  oxyCODONE-acetaminophen (PERCOCET/ROXICET) 5-325 MG tablet Take 1 tablet by mouth every 6 (six) hours as needed. 06/04/19   Finneus Kaneshiro, Dellis Filbert, PA-C  pantoprazole (PROTONIX) 40 MG tablet Take 1 tablet (40 mg total) by mouth daily. 03/22/19   Argentina Donovan, PA-C  TRUEplus Lancets 28G MISC 1 each by Does not apply route 2 (two) times  a day. 03/22/19   Argentina Donovan, PA-C  sodium chloride (OCEAN) 0.65 % SOLN nasal spray Place 1 spray into both nostrils as needed for congestion. Patient not taking: Reported on 03/13/2019 07/24/18 03/13/19  Arville Lime, PA-C    Family History Family History  Problem Relation Age of Onset  . Diabetes Mother   . Diabetes Father   . Hypertension Father   . Heart disease Father   . Cancer Neg Hx     Social History Social History   Tobacco Use  . Smoking status: Never Smoker  . Smokeless tobacco: Never Used  Substance Use Topics  . Alcohol use: Yes  . Drug use: No     Allergies   Patient has no known allergies.    Review of Systems Review of Systems  All other systems reviewed and are negative.   Physical Exam Updated Vital Signs BP (!) 113/59 (BP Location: Left Arm)   Pulse 79   Temp 98.7 F (37.1 C) (Oral)   Resp 17   SpO2 96%   Physical Exam Vitals signs and nursing note reviewed.  Constitutional:      Appearance: She is well-developed.  HENT:     Head: Normocephalic and atraumatic.  Eyes:     General: No scleral icterus.       Right eye: No discharge.        Left eye: No discharge.     Conjunctiva/sclera: Conjunctivae normal.     Pupils: Pupils are equal, round, and reactive to light.  Neck:     Musculoskeletal: Normal range of motion.     Vascular: No JVD.     Trachea: No tracheal deviation.  Pulmonary:     Effort: Pulmonary effort is normal.     Breath sounds: No stridor.  Abdominal:     Comments: Tenderness palpation right upper quadrant remainder abdomen soft nontender  Neurological:     Mental Status: She is alert and oriented to person, place, and time.     Coordination: Coordination normal.  Psychiatric:        Behavior: Behavior normal.        Thought Content: Thought content normal.        Judgment: Judgment normal.      ED Treatments / Results  Labs (all labs ordered are listed, but only abnormal results are displayed) Labs Reviewed  COMPREHENSIVE METABOLIC PANEL - Abnormal; Notable for the following components:      Result Value   Glucose, Bld 196 (*)    Calcium 8.5 (*)    Total Protein 5.5 (*)    Albumin 2.9 (*)    All other components within normal limits  CBC - Abnormal; Notable for the following components:   RBC 3.66 (*)    Hemoglobin 10.7 (*)    HCT 33.2 (*)    All other components within normal limits  SARS CORONAVIRUS 2 BY RT PCR (HOSPITAL ORDER, [facility])  LIPASE, BLOOD  URINALYSIS, ROUTINE W REFLEX MICROSCOPIC  I-STAT BETA HCG BLOOD, ED ([facility], [facility], [facility] ONLY)    EKG None  Radiology US Abdomen Limited Ruq   Result Date: 06/04/2019 CLINICAL DATA:  Right upper quadrant pain EXAM: ULTRASOUND ABDOMEN LIMITED RIGHT UPPER QUADRANT COMPARISON:  None. FINDINGS: Gallbladder: Within the gallbladder, there are echogenic foci which move and shadow consistent with cholelithiasis. Largest gallstone measures 1.1 cm in length. There is no gallbladder wall thickening or pericholecystic fluid. No sonographic Murphy sign noted by sonographer.  Common bile duct: Diameter: 4 mm. No intrahepatic or extrahepatic biliary duct dilatation. Liver: No focal lesion identified. Liver echogenicity overall is increased. Portal vein is patent on color Doppler imaging with normal direction of blood flow towards the liver. Other: None. IMPRESSION: 1. Cholelithiasis. No gallbladder wall thickening or pericholecystic fluid. 2. Diffuse increased liver echogenicity, a finding indicative of hepatic steatosis. No focal liver lesions are evident. Note that the sensitivity of ultrasound for detection of focal liver lesions is somewhat diminished in this circumstance. Electronically Signed   By: Lowella Grip III M.D.   On: 06/04/2019 15:06    Procedures Procedures (including critical care time)  Medications Ordered in ED Medications  sodium chloride flush (NS) 0.9 % injection 3 mL (3 mLs Intravenous Given 06/04/19 1622)  morphine 4 MG/ML injection 4 mg (4 mg Intravenous Given 06/04/19 1622)     Initial Impression / Assessment and Plan / ED Course  I have reviewed the triage vital signs and the nursing notes.  Pertinent labs & imaging results that were available during my care of the patient were reviewed by me and considered in my medical decision making (see chart for details).       38 year old female presents today with likely symptomatic cholelithiasis.  She has no signs of systemic illness no signs of cholecystitis or obstruction.  I discussed case with general surgery, patient will be stable for outpatient continued management.   I will give her pain medication, she will be given strict return precautions.  Patient did have appointment scheduled for follow-up and will be making this appointment.  She verbalized understanding and agreement to this plan had no further questions or concerns.  Final Clinical Impressions(s) / ED Diagnoses   Final diagnoses:  Abdominal pain, RUQ    ED Discharge Orders         Ordered    oxyCODONE-acetaminophen (PERCOCET/ROXICET) 5-325 MG tablet  Every 6 hours PRN     06/04/19 1628           Okey Regal, PA-C 06/04/19 1744    Pattricia Boss, MD 06/04/19 [phone]

## 2019-06-04 NOTE — ED Triage Notes (Signed)
Patient complains of ongoing epigastric pain for several weeks. Diagnosed with gallbladder disease and has not seen surgeon, nausea with same

## 2019-06-06 MED FILL — metFORMIN HCL 500 MG TABS: 500 | 30 days supply | Qty: 60 | Fill #2

## 2019-06-15 MED FILL — PANTOPRAZOLE SOD DR 40 MG T: 40 | 30 days supply | Qty: 30 | Fill #2

## 2019-06-18 MED FILL — GABAPENTIN 300 MG CAPSULE: 300 | 30 days supply | Qty: 180 | Fill #2

## 2019-06-20 MED FILL — glipiZIDE 10 MG TABS: 10 | 30 days supply | Qty: 60 | Fill #2

## 2019-06-22 MED FILL — TRUE METRIX TEST STRIP: 25 days supply | Qty: 100 | Fill #2

## 2019-06-27 ENCOUNTER — Ambulatory Visit: Payer: Self-pay | Attending: Family Medicine | Admitting: Family Medicine

## 2019-06-27 ENCOUNTER — Other Ambulatory Visit: Payer: Self-pay

## 2019-06-27 ENCOUNTER — Encounter: Payer: Self-pay | Admitting: Family Medicine

## 2019-06-27 VITALS — BP 142/89 | HR 91 | Temp 98.5°F | Ht 68.0 in | Wt 346.0 lb

## 2019-06-27 DIAGNOSIS — E1149 Type 2 diabetes mellitus with other diabetic neurological complication: Secondary | ICD-10-CM

## 2019-06-27 DIAGNOSIS — E1165 Type 2 diabetes mellitus with hyperglycemia: Secondary | ICD-10-CM

## 2019-06-27 DIAGNOSIS — R202 Paresthesia of skin: Secondary | ICD-10-CM

## 2019-06-27 DIAGNOSIS — N644 Mastodynia: Secondary | ICD-10-CM

## 2019-06-27 DIAGNOSIS — Z6841 Body Mass Index (BMI) 40.0 and over, adult: Secondary | ICD-10-CM

## 2019-06-27 DIAGNOSIS — M791 Myalgia, unspecified site: Secondary | ICD-10-CM

## 2019-06-27 DIAGNOSIS — E669 Obesity, unspecified: Secondary | ICD-10-CM

## 2019-06-27 LAB — GLUCOSE, POCT (MANUAL RESULT ENTRY): POC Glucose: 187 mg/dl — AB (ref 70–99)

## 2019-06-27 LAB — POCT GLYCOSYLATED HEMOGLOBIN (HGB A1C): HbA1c, POC (controlled diabetic range): 8 % — AB (ref 0.0–7.0)

## 2019-06-27 MED ORDER — CYCLOBENZAPRINE HCL 10 MG PO TABS: 10.0000 mg | ORAL_TABLET | Freq: Two times a day (BID) | ORAL | 1 refills | Status: DC | PRN

## 2019-06-27 MED ORDER — DULOXETINE HCL 60 MG PO CPEP: 60.0000 mg | ORAL_CAPSULE | Freq: Every day | ORAL | 3 refills | Status: DC

## 2019-06-27 MED ORDER — PREGABALIN 100 MG PO CAPS: 100.0000 mg | ORAL_CAPSULE | Freq: Two times a day (BID) | ORAL | 3 refills | Status: DC

## 2019-06-27 MED FILL — DULoxetine HCL 60 MG CPEP: 60 | 30 days supply | Qty: 30 | Fill #0

## 2019-06-27 MED FILL — CYCLOBENZAPRINE 10 MG TAB: 10 | 30 days supply | Qty: 60 | Fill #0

## 2019-06-27 MED FILL — PREGABALIN 100 MG CAPS: 100 | 30 days supply | Qty: 60 | Fill #0

## 2019-06-27 NOTE — Progress Notes (Signed)
Subjective:  Patient ID: Maria Morris, female    DOB: 05-05-1981  Age: 38 y.o. MRN: [id number]  CC: Diabetes and Pain   HPI [name] is a 38 year old female with a history of type 2 diabetes mellitus (A1c 8.0), diabetic neuropathy, obesity here for an acute visit complaining of sharp pains which prevents her from sleeping at night. She previously had generalized pains for several months but this has worsened over the last couple of weeks to the point that she has to use hot showers to relieve her pain.  Her shoulders hurt, her joints and her muscles hurt and she complains of fatigue and feels like somebody kicked her out all over. She denies presence of fevers, cough, dyspnea, chest pain.  She does have diabetic neuropathy which presents like sharp and shooting pains in her hands and feet and states these are uncontrolled on her current dose of gabapentin. Also complains of sharp pains in her left breast which is shooting and intermittent but denies presence of masses.  She does have a family history of breast cancer in a maternal aunt was diagnosed in her 64s.  Past Medical History:  Diagnosis Date  . Anemia   . Diabetic neuropathy ([place])   . Type 2 diabetes mellitus ([place]) 2014    Past Surgical History:  Procedure Laterality Date  . NO PAST SURGERIES      Family History  Problem Relation Age of Onset  . Diabetes Mother   . Diabetes Father   . Hypertension Father   . Heart disease Father   . Cancer Neg Hx     No Known Allergies  Outpatient Medications Prior to Visit  Medication Sig Dispense Refill  . acetaminophen (TYLENOL) 500 MG tablet Take 1,000-2,000 mg by mouth 3 (three) times daily as needed for headache (migraine).    Marland Kitchen aspirin-sod bicarb-citric acid (ALKA-SELTZER) 325 MG TBEF tablet Take 325 mg by mouth every 6 (six) hours as needed (indigestion).    . bismuth subsalicylate (PEPTO BISMOL) 262 MG/15ML suspension Take 30 mLs by mouth every 6 (six) hours  as needed for indigestion.    . Blood Glucose Monitoring Suppl (TRUE METRIX METER) w/Device KIT 1 each by Does not apply route 2 (two) times a day. 1 kit 0  . famotidine (PEPCID) 20 MG tablet Take 1 tablet (20 mg total) by mouth 2 (two) times daily. 30 tablet 0  . gabapentin (NEURONTIN) 300 MG capsule Take 2 capsules (600 mg total) by mouth 3 (three) times daily. 180 capsule 3  . glipiZIDE (GLUCOTROL) 10 MG tablet Take 1 tablet (10 mg total) by mouth 2 (two) times daily before a meal. 60 tablet 3  . glucose blood (TRUE METRIX BLOOD GLUCOSE TEST) test strip Use as instructed 100 each 12  . metFORMIN (GLUCOPHAGE) 500 MG tablet Take 1 tablet (500 mg total) by mouth 2 (two) times daily with a meal. 60 tablet 2  . pantoprazole (PROTONIX) 40 MG tablet Take 1 tablet (40 mg total) by mouth daily. 30 tablet 2  . TRUEplus Lancets 28G MISC 1 each by Does not apply route 2 (two) times a day. 100 each 1  . oxyCODONE-acetaminophen (PERCOCET/ROXICET) 5-325 MG tablet Take 1 tablet by mouth every 6 (six) hours as needed. (Patient not taking: Reported on 06/27/2019) 10 tablet 0   No facility-administered medications prior to visit.      ROS Review of Systems  Constitutional: Negative for activity change, appetite change and fatigue.  HENT: Negative for congestion, sinus  pressure and sore throat.   Eyes: Negative for visual disturbance.  Respiratory: Negative for cough, chest tightness, shortness of breath and wheezing.   Cardiovascular: Negative for chest pain and palpitations.  Gastrointestinal: Negative for abdominal distention, abdominal pain and constipation.  Endocrine: Negative for polydipsia.  Genitourinary: Negative for dysuria and frequency.  Musculoskeletal:       See HPI  Skin: Negative for rash.  Neurological: Positive for numbness. Negative for tremors and light-headedness.  Hematological: Does not bruise/bleed easily.  Psychiatric/Behavioral: Negative for agitation and behavioral problems.     Objective:  BP (!) 142/89   Pulse 91   Temp 98.5 F (36.9 C) (Oral)   Ht '5\' 8"'  (1.727 m)   Wt (!) 346 lb (156.9 kg)   SpO2 97%   BMI 52.61 kg/m   BP/Weight 06/27/2019 06/04/2019 11/19/760  Systolic BP 263 335 456  Diastolic BP 89 59 84  Wt. (Lbs) 346 - 332  BMI 52.61 - 52      Physical Exam Constitutional:      Appearance: She is well-developed. She is obese.  Neck:     Vascular: No JVD.  Cardiovascular:     Rate and Rhythm: Normal rate.     Heart sounds: Normal heart sounds. No murmur.  Pulmonary:     Effort: Pulmonary effort is normal.     Breath sounds: Normal breath sounds. No wheezing or rales.  Chest:     Chest wall: No tenderness.     Breasts:        Right: No mass or tenderness.        Left: No mass or tenderness.  Abdominal:     General: Bowel sounds are normal. There is no distension.     Palpations: Abdomen is soft. There is no mass.     Tenderness: There is no abdominal tenderness.  Musculoskeletal: Normal range of motion.     Right lower leg: No edema.     Left lower leg: No edema.  Neurological:     Mental Status: She is alert and oriented to person, place, and time.  Psychiatric:        Mood and Affect: Mood normal.     CMP Latest Ref Rng & Units 06/04/2019 03/13/2019 09/05/2016  Glucose 70 - 99 mg/dL 196(H) 337(H) 166(H)  BUN 6 - 20 mg/dL '10 11 10  ' Creatinine 0.44 - 1.00 mg/dL 0.88 0.84 0.68  Sodium 135 - 145 mmol/L 142 137 140  Potassium 3.5 - 5.1 mmol/L 3.7 4.1 4.0  Chloride 98 - 111 mmol/L 107 96(L) 105  CO2 22 - 32 mmol/L '26 23 26  ' Calcium 8.9 - 10.3 mg/dL 8.5(L) 8.9 9.2  Total Protein 6.5 - 8.1 g/dL 5.5(L) 6.5 7.1  Total Bilirubin 0.3 - 1.2 mg/dL 0.6 0.7 0.8  Alkaline Phos 38 - 126 U/L 61 68 62  AST 15 - 41 U/L 28 40 19  ALT 0 - 44 U/L '21 29 16     ' CBC    Component Value Date/Time   WBC 8.7 06/04/2019 1357   RBC 3.66 (L) 06/04/2019 1357   HGB 10.7 (L) 06/04/2019 1357   HCT 33.2 (L) 06/04/2019 1357   PLT 201 06/04/2019  1357   MCV 90.7 06/04/2019 1357   MCH 29.2 06/04/2019 1357   MCHC 32.2 06/04/2019 1357   RDW 14.0 06/04/2019 1357    Lab Results  Component Value Date   HGBA1C 8.0 (A) 06/27/2019    Assessment & Plan:   1.  Type 2 diabetes mellitus with other neurologic complication, without long-term current use of insulin (HCC) Uncontrolled with A1c of 8.0 which has improved from 10.1 previously; goal is less than 7 No regimen change today and she has been encouraged to continue her current regimen and comply with a diabetic diet and lifestyle modifications. Neuropathy is uncontrolled on gabapentin and I have switched her to Lyrica which she will commence once she obtains it and discontinue gabapentin. - Glucose (CBG) - HgB A1c - pregabalin (LYRICA) 100 MG capsule; Take 1 capsule (100 mg total) by mouth 2 (two) times daily. Discontinue gabapentin once Lyrica is obtained  Dispense: 60 capsule; Refill: 3  2. Myalgia Unknown etiology We will evaluate for underlying autoimmune condition Cymbalta has been commenced Encouraged to performance of home exercise regimen. - DULoxetine (CYMBALTA) 60 MG capsule; Take 1 capsule (60 mg total) by mouth daily.  Dispense: 30 capsule; Refill: 3 - cyclobenzaprine (FLEXERIL) 10 MG tablet; Take 1 tablet (10 mg total) by mouth 2 (two) times daily as needed for muscle spasms.  Dispense: 60 tablet; Refill: 1 - ANA - Sedimentation Rate - RA Qn+CCP(IgG/A)+SjoSSA+SjoSSB - C-reactive protein  3. Breast pain - MM Digital Diagnostic Bilat; Future - US BREAST LTD UNI LEFT INC AXILLA; Future   Health Care Maintenance: We will defer to PCP to address Pap smear Meds ordered this encounter  Medications  . DULoxetine (CYMBALTA) 60 MG capsule    Sig: Take 1 capsule (60 mg total) by mouth daily.    Dispense:  30 capsule    Refill:  3  . cyclobenzaprine (FLEXERIL) 10 MG tablet    Sig: Take 1 tablet (10 mg total) by mouth 2 (two) times daily as needed for muscle spasms.     Dispense:  60 tablet    Refill:  1  . pregabalin (LYRICA) 100 MG capsule    Sig: Take 1 capsule (100 mg total) by mouth 2 (two) times daily. Discontinue gabapentin once Lyrica is obtained    Dispense:  60 capsule    Refill:  3    Follow-up: Return in about 1 month (around 07/27/2019) for Follow-up of myalgia with PCP Dr. Chapman Fitch.       Charlott Rakes, MD, FAAFP. [facility] [place], [place]   06/27/2019, 4:10 PM

## 2019-06-27 NOTE — Patient Instructions (Signed)

## 2019-06-27 NOTE — Progress Notes (Signed)
Patient is having pain all over body.

## 2019-06-29 ENCOUNTER — Other Ambulatory Visit (HOSPITAL_COMMUNITY): Payer: Self-pay | Admitting: *Deleted

## 2019-06-29 DIAGNOSIS — N644 Mastodynia: Secondary | ICD-10-CM

## 2019-06-29 LAB — RA QN+CCP(IGG/A)+SJOSSA+SJOSSB
Cyclic Citrullin Peptide Ab: 8 units (ref 0–19)
ENA SSA (RO) Ab: <0.2 AI (ref 0.0–0.9)
ENA SSB (LA) Ab: <0.2 AI (ref 0.0–0.9)
Rheumatoid fact SerPl-aCnc: <10 IU/mL (ref 0.0–13.9)

## 2019-06-29 LAB — SEDIMENTATION RATE: Sed Rate: 38 mm/hr — ABNORMAL HIGH (ref 0–32)

## 2019-06-29 LAB — ANA: Anti Nuclear Antibody (ANA): NEGATIVE

## 2019-06-29 LAB — C-REACTIVE PROTEIN: CRP: 19 mg/L — ABNORMAL HIGH (ref 0–10)

## 2019-07-13 ENCOUNTER — Other Ambulatory Visit: Payer: Self-pay | Admitting: Physician Assistant

## 2019-07-13 DIAGNOSIS — E1165 Type 2 diabetes mellitus with hyperglycemia: Secondary | ICD-10-CM

## 2019-07-13 MED FILL — metFORMIN HCL 500 MG TABS: 500 | 30 days supply | Qty: 60 | Fill #0

## 2019-07-16 ENCOUNTER — Other Ambulatory Visit: Payer: Self-pay | Admitting: Physician Assistant

## 2019-07-16 DIAGNOSIS — K8080 Other cholelithiasis without obstruction: Secondary | ICD-10-CM

## 2019-07-23 ENCOUNTER — Other Ambulatory Visit: Payer: Self-pay | Admitting: Physician Assistant

## 2019-07-23 DIAGNOSIS — K8080 Other cholelithiasis without obstruction: Secondary | ICD-10-CM

## 2019-07-26 ENCOUNTER — Encounter (HOSPITAL_COMMUNITY): Payer: Self-pay

## 2019-07-26 ENCOUNTER — Telehealth: Payer: Self-pay | Admitting: Family Medicine

## 2019-07-26 ENCOUNTER — Ambulatory Visit (HOSPITAL_COMMUNITY)
Admission: RE | Admit: 2019-07-26 | Discharge: 2019-07-26 | Disposition: A | Discharge status: Home / Self Care | Payer: Medicaid Other | Source: Ambulatory Visit | Attending: Obstetrics and Gynecology | Admitting: Obstetrics and Gynecology

## 2019-07-26 ENCOUNTER — Ambulatory Visit
Admission: RE | Admit: 2019-07-26 | Discharge: 2019-07-26 | Disposition: A | Discharge status: Home / Self Care | Payer: No Typology Code available for payment source | Source: Ambulatory Visit | Attending: Obstetrics and Gynecology | Admitting: Obstetrics and Gynecology

## 2019-07-26 ENCOUNTER — Other Ambulatory Visit: Payer: Self-pay

## 2019-07-26 ENCOUNTER — Ambulatory Visit: Payer: Medicaid Other

## 2019-07-26 DIAGNOSIS — N644 Mastodynia: Secondary | ICD-10-CM

## 2019-07-26 DIAGNOSIS — Z01419 Encounter for gynecological examination (general) (routine) without abnormal findings: Secondary | ICD-10-CM

## 2019-07-26 IMAGING — MG DIGITAL DIAGNOSTIC BILAT W/ TOMO W/ CAD
8 of 14 series · 8 of 40 positions shown · non-contrast
Comparison: None

CLINICAL DATA: Patient describes diffuse bilateral breast pain I
have male within the UPPER portion of the breast. Pain has come and
gone for 3 months. Patient has irregular cycles.

EXAM:
DIGITAL DIAGNOSTIC BILATERAL MAMMOGRAM WITH CAD AND TOMO

[R MLO synth-2D (1 of 2)]
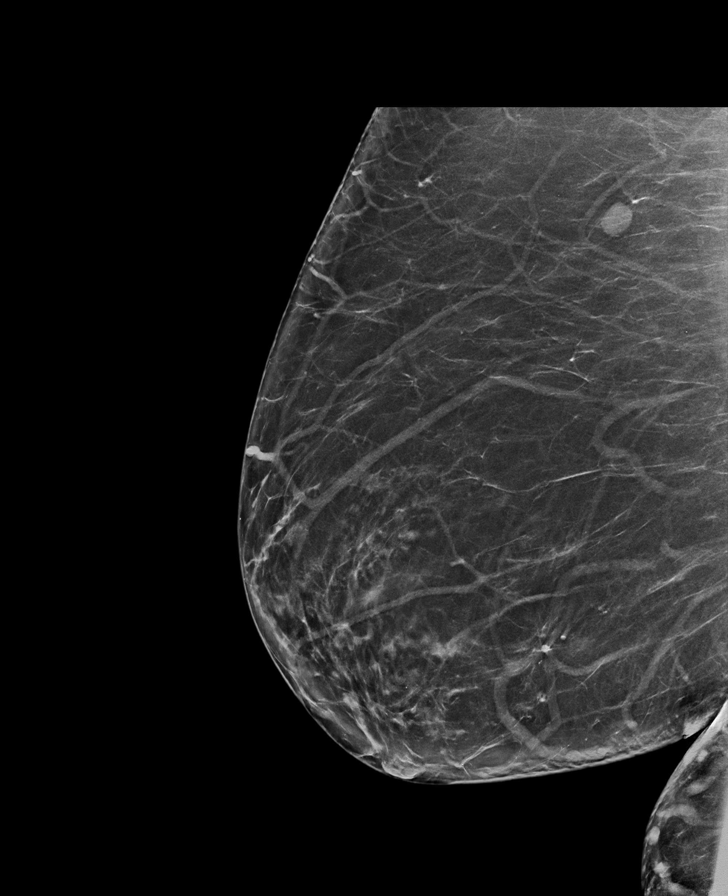

[R MLO synth-2D (2 of 2)]
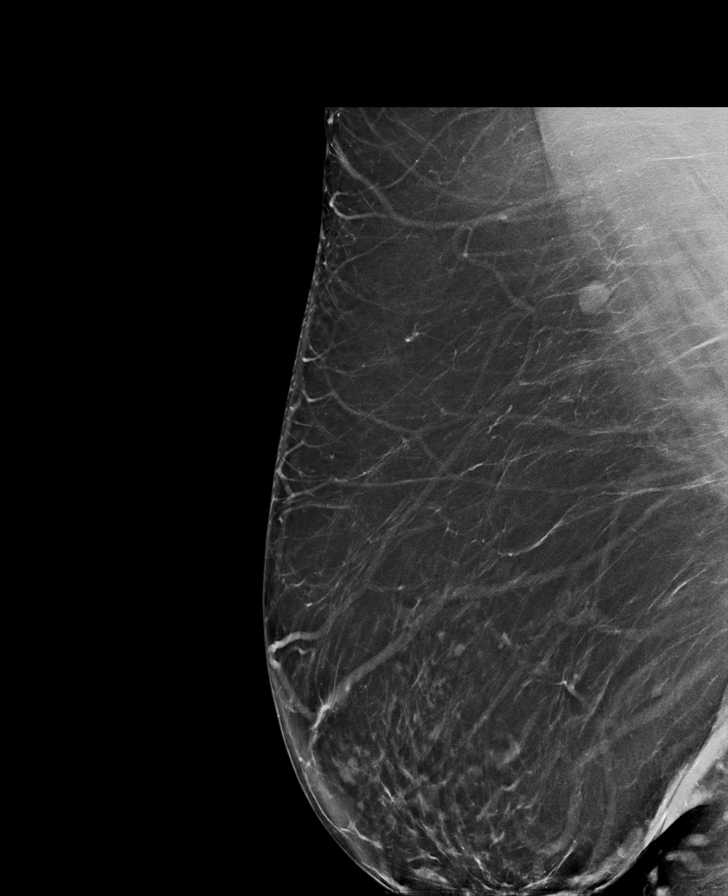

[L CC synth-2D]
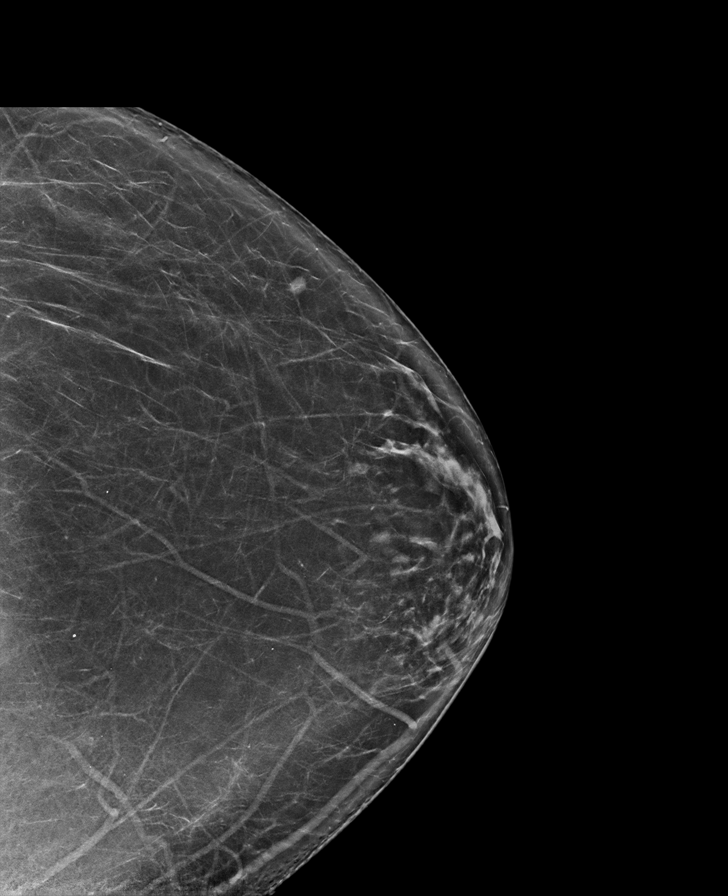

[L MLO synth-2D]
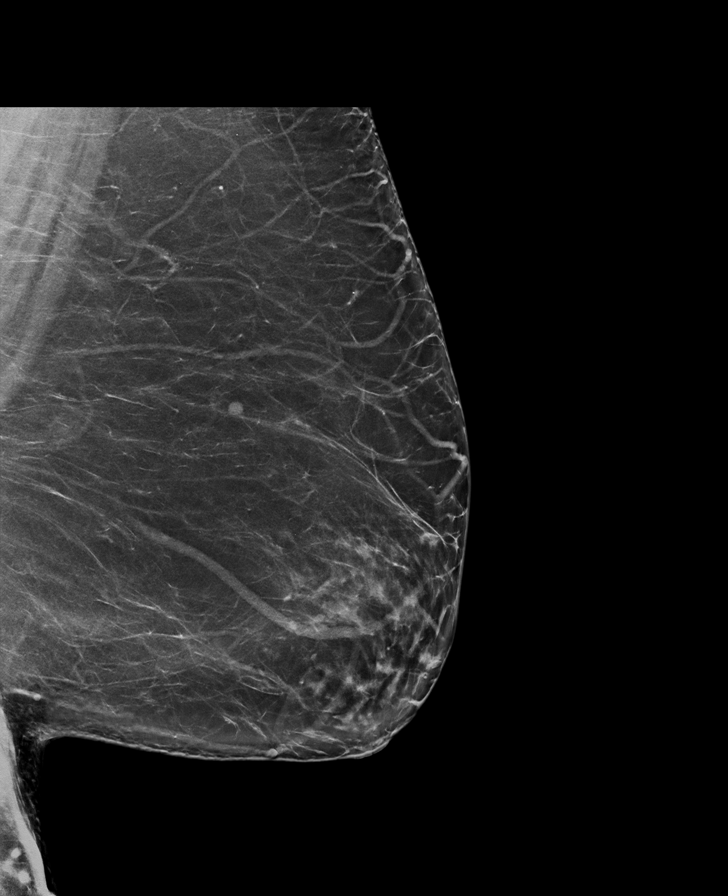

[L CV synth-2D]
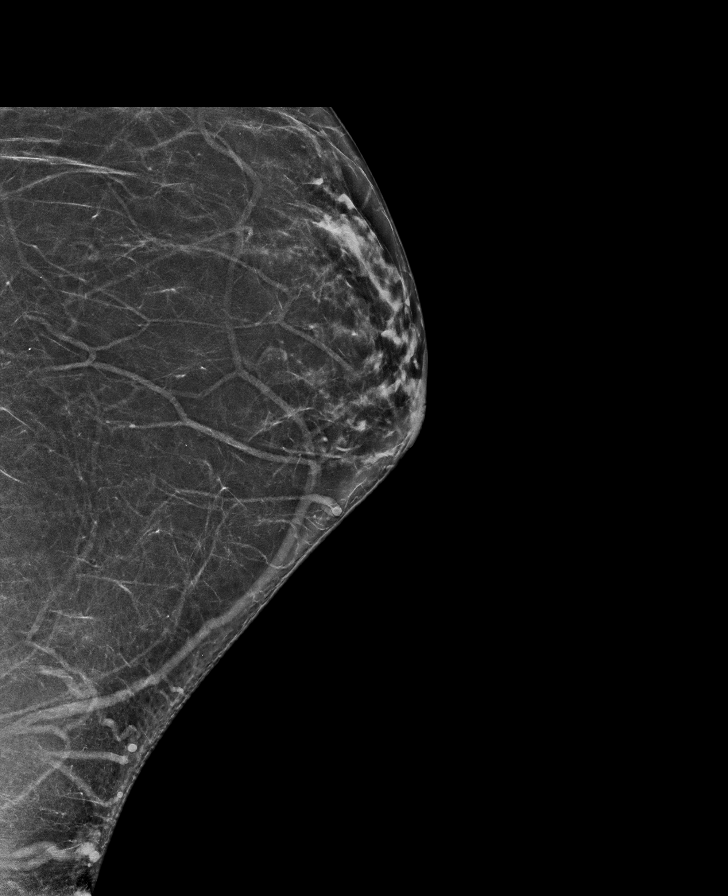

[R CV synth-2D]
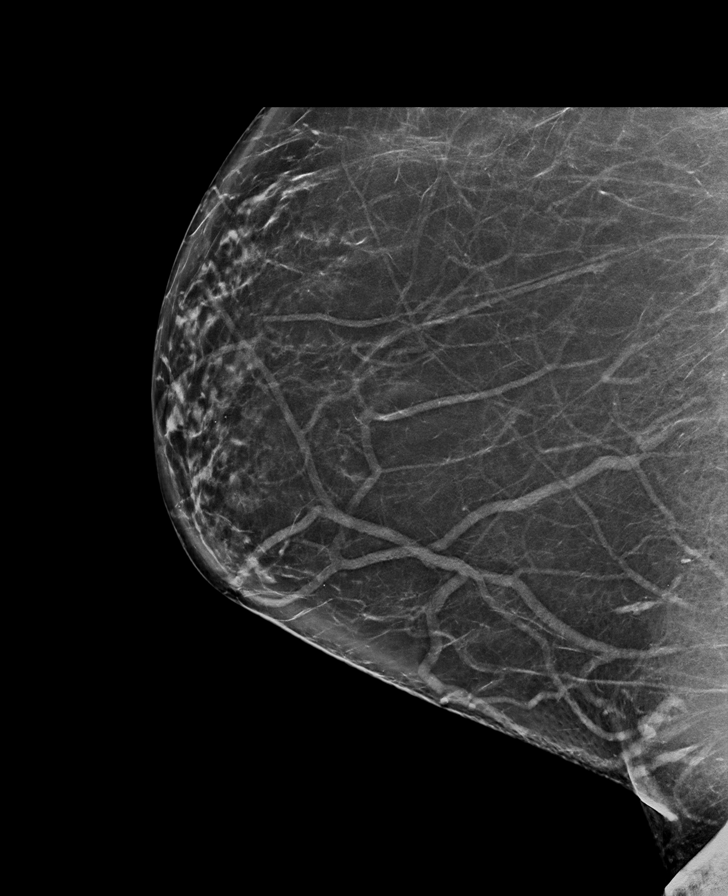

[R CC synth-2D]
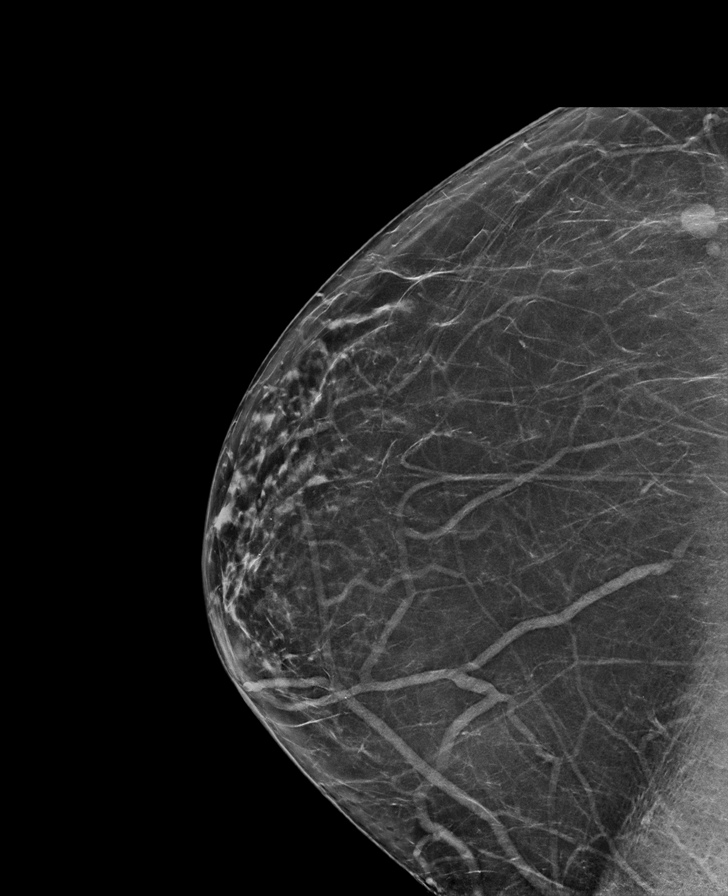

[L MLO tomo · tomo slice 43/86.0]
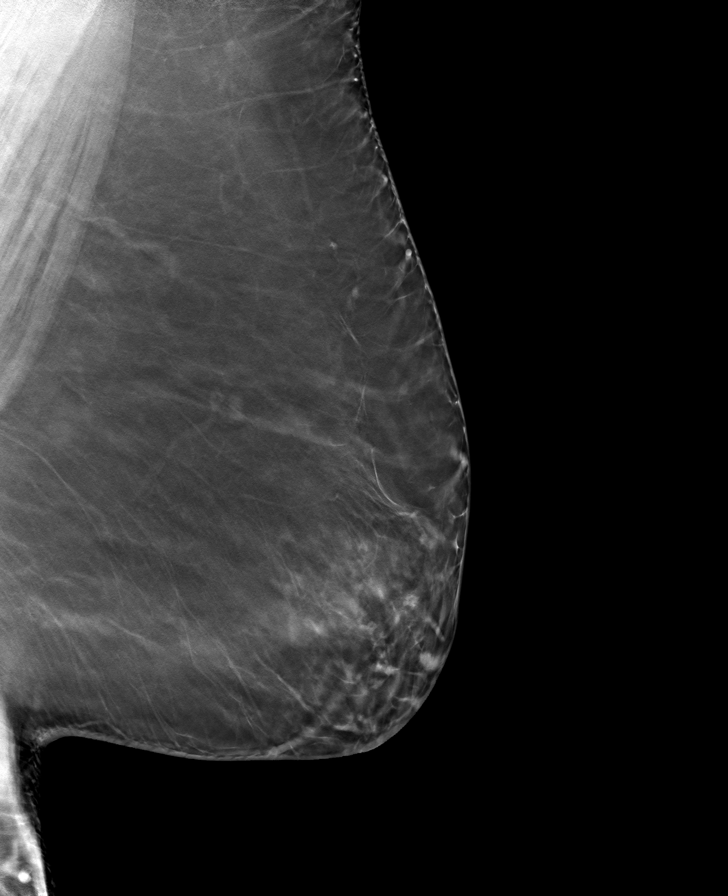

[8 of 40 positions shown; findings below may reference images not displayed]

ACR Breast Density Category b: There are scattered areas of
fibroglandular density.
FINDINGS: No suspicious mass, distortion, or microcalcifications are
identified to suggest presence of malignancy.

Mammographic images were processed with CAD.
IMPRESSION: No mammographic evidence for malignancy.

Benign breast pain is a common condition. Is commonly exacerbated by
caffeine and hormonal changes. It can be improved by wearing
adequate support, over-the-counter pain medication, low-fat diet,
exercise, and ice as needed. Often breast pain will resolve on its
own without intervention.

RECOMMENDATION:
Screening mammogram at age 40 unless there are persistent or
intervening clinical concerns. (Code:[2P])

I have discussed the findings and recommendations with the patient.
If applicable, a reminder letter will be sent to the patient
regarding the next appointment.

BI-RADS CATEGORY  1: Negative.

## 2019-07-26 NOTE — Patient Instructions (Addendum)
Explained breast self awareness with Hazle Coca. Let patient know [facility] will cover Pap smears and HPV typing every 5 years unless has a history of abnormal Pap smears. Referred patient to the [facility] for a diagnostic mammogram. Appointment scheduled for Thursday, July 26, 2019 at [place]. Patient aware of appointment and will be there. Let patient know will follow-up with her within the next couple of weeks with results of Pap smear by letter or phone. Hazle Coca verbalized understanding.  Adal Sereno, Arvil Ch[place], RN 8:21 AM

## 2019-07-26 NOTE — Telephone Encounter (Signed)
Attempted to contact patient x2 w/ an appointment for not having a period in 3 years. No answer number stated that the call could not be completed at this time. Reminder mailed.

## 2019-07-26 NOTE — Addendum Note (Signed)
Encounter addended by: Loletta Parish, RN on: 07/26/2019 9:25 AM  Actions taken: Clinical Note Signed

## 2019-07-26 NOTE — Progress Notes (Signed)
Complaints of left upper/inner breast pain x four months that comes and goes. Patient rates the pain at a 7 out of 10.  Complaints of right upper/outer breast pain x three months that comes and goes. Patient rates the pain at a 7 out of 10.  Patient complained of not having a menstrual period for three years and trying to get pregnant. Referral sent to the [facility] [employer]. Patient informed that [facility] will not cover appointment and given the [facility] Application.  Pap Smear: Pap smear completed today. Last Pap smear was in 2003 in [place], [place] and normal per patient. Per patient has no history of an abnormal Pap smear. No Pap smear results are in Epic.  Physical exam: Breasts Right breast larger than left that per patient is normal for her. No skin abnormalities bilateral breasts. No nipple retraction bilateral breasts. No nipple discharge bilateral breasts. No lymphadenopathy. No lumps palpated bilateral breasts. Complaints of left upper breast pain on exam. Referred patient to the [facility] for a diagnostic mammogram. Appointment scheduled for Thursday, July 26, 2019 at [place].        Pelvic/Bimanual   Ext Genitalia No lesions, no swelling and no discharge observed on external genitalia.         Vagina Vagina pink and normal texture. No lesions or discharge observed in vagina.          Cervix Cervix is present. Cervix pink and of normal texture. No discharge observed.     Uterus Uterus is present and difficult to palpate due to obesity.      Adnexae Bilateral ovaries present and unable to palpate. No tenderness on palpation.         Rectovaginal No rectal exam completed today since patient had no rectal complaints. No skin abnormalities observed on exam.    Smoking History: Patient has never smoked.  Patient Navigation: Patient education provided. Access to services provided for patient through [facility] program.   Breast and  Cervical Cancer Risk Assessment: Patient has a family history of a paternal aunt having breast cancer. Patient has no known genetic mutations or history of radiation treatment to the chest before age 89. Patient has no history of cervical dysplasia, immunocompromised, or DES exposure in-utero.  Risk Assessment    Risk Scores      07/26/2019   Last edited by: Loletta Parish, RN   5-year risk: 0.4 %   Lifetime risk: 12.3 %

## 2019-07-27 ENCOUNTER — Ambulatory Visit: Payer: Medicaid Other | Attending: Family Medicine | Admitting: Family Medicine

## 2019-07-27 ENCOUNTER — Encounter: Payer: Self-pay | Admitting: Family Medicine

## 2019-07-27 DIAGNOSIS — IMO0002 Reserved for concepts with insufficient information to code with codable children: Secondary | ICD-10-CM

## 2019-07-27 DIAGNOSIS — K219 Gastro-esophageal reflux disease without esophagitis: Secondary | ICD-10-CM

## 2019-07-27 DIAGNOSIS — D649 Anemia, unspecified: Secondary | ICD-10-CM

## 2019-07-27 DIAGNOSIS — M791 Myalgia, unspecified site: Secondary | ICD-10-CM

## 2019-07-27 DIAGNOSIS — E1165 Type 2 diabetes mellitus with hyperglycemia: Secondary | ICD-10-CM

## 2019-07-27 DIAGNOSIS — Z599 Problem related to housing and economic circumstances, unspecified: Secondary | ICD-10-CM

## 2019-07-27 DIAGNOSIS — Z598 Other problems related to housing and economic circumstances: Secondary | ICD-10-CM

## 2019-07-27 DIAGNOSIS — R000 Tachycardia, unspecified: Secondary | ICD-10-CM

## 2019-07-27 DIAGNOSIS — E1142 Type 2 diabetes mellitus with diabetic polyneuropathy: Secondary | ICD-10-CM

## 2019-07-27 DIAGNOSIS — F411 Generalized anxiety disorder: Secondary | ICD-10-CM

## 2019-07-27 DIAGNOSIS — F430 Acute stress reaction: Secondary | ICD-10-CM

## 2019-07-27 MED ORDER — DULOXETINE HCL 60 MG PO CPEP: 60.0000 mg | ORAL_CAPSULE | Freq: Every day | ORAL | 3 refills | Status: DC

## 2019-07-27 MED ORDER — GLIPIZIDE 10 MG PO TABS: 10.0000 mg | ORAL_TABLET | Freq: Two times a day (BID) | ORAL | 3 refills | Status: DC

## 2019-07-27 MED ORDER — METFORMIN HCL 500 MG PO TABS: 500.0000 mg | ORAL_TABLET | Freq: Two times a day (BID) | ORAL | 2 refills | Status: AC

## 2019-07-27 MED ORDER — FAMOTIDINE 20 MG PO TABS: 20.0000 mg | ORAL_TABLET | Freq: Two times a day (BID) | ORAL | 1 refills | Status: DC

## 2019-07-27 MED ORDER — PANTOPRAZOLE SODIUM 40 MG PO TBEC: 40.0000 mg | DELAYED_RELEASE_TABLET | Freq: Every day | ORAL | 2 refills | Status: DC

## 2019-07-27 MED ORDER — PREGABALIN 100 MG PO CAPS: 100.0000 mg | ORAL_CAPSULE | Freq: Two times a day (BID) | ORAL | 3 refills | Status: DC

## 2019-07-27 MED ORDER — CYCLOBENZAPRINE HCL 10 MG PO TABS: 10.0000 mg | ORAL_TABLET | Freq: Two times a day (BID) | ORAL | 1 refills | Status: DC | PRN

## 2019-07-27 MED ORDER — SERTRALINE HCL 25 MG PO TABS: 25.0000 mg | ORAL_TABLET | Freq: Every day | ORAL | 1 refills | Status: DC

## 2019-07-27 MED FILL — PREGABALIN 100 MG CAPS: 100 | 30 days supply | Qty: 60 | Fill #0

## 2019-07-27 MED FILL — ?DULOXETINE HCL 60 MG CPEP: 60 | 30 days supply | Qty: 30 | Fill #0

## 2019-07-27 MED FILL — ?GLIPIZIDE 10 MG TABLET: 10 | 30 days supply | Qty: 60 | Fill #0

## 2019-07-27 MED FILL — ?SERTRALINE HCL 25MG TABS: 25 | 30 days supply | Qty: 30 | Fill #0

## 2019-07-27 MED FILL — PANTOPRAZOLE SOD DR 40 MG T: 40 | 30 days supply | Qty: 30 | Fill #0

## 2019-07-27 MED FILL — CYCLOBENZAPRINE 10 MG TAB: 10 | 30 days supply | Qty: 60 | Fill #0

## 2019-07-27 NOTE — Progress Notes (Signed)
Patient verified DOB Patient has taken medication today. Patient has eaten today. Patient has pain for the past two weeks generalized. Patient has heightened anxiety due to pandemic and no longer being able to draw unemployment. CBG: 190 today before eating.

## 2019-07-27 NOTE — Progress Notes (Signed)
Virtual Visit via Telephone Note  I connected with on 07/27/19 at 11:10 AM EST by telephone and verified that I am speaking with the correct person using two identifiers.   I discussed the limitations, risks, security and privacy concerns of performing an evaluation and management service by telephone and the availability of in person appointments. I also discussed with the patient that there may be a patient responsible charge related to this service. The patient expressed understanding and agreed to proceed.  Patient Location: Home Provider Location: [facility] Others participating in call: none   History of Present Illness:      38 yo female with diabetes with peripheral neuropathy and other chronic medications with complaint of 2 weeks of increased generalized body aches- no COIVD-19 symptoms such as fever, chills, headaches, dizziness, sore throat or shortness of breath or cough. She has taken flexeril in the past and would like a refill. She also has had increased anxiety including difficulty sleeping, increased heart rate episodes which she thinks are related to worrying over financial difficulty and due to the current COVID-19 pandemic. She reports that she has actually been out of all of her medications for the past 2 weeks. When she was on duloxetine she does think that it also helped with her anxiety.         She needs refills of her medicines for diabetes.  She reports that her blood sugars are improving.  She denies any current increased thirst, no urinary frequency and no blurred vision related to her diabetes.  She does have difficulty being compliant with a low carbohydrate diet due to the cost of healthier food options.  She reports current increase in anxiety and stress due to the current COVID-19 pandemic as she is currently not employed and has run out of unemployment benefits.  She also feels as if she is having increased acid reflux symptoms related to feeling anxious.  She  also feels as if she has had increased heart rate likely due to anxiety.  She denies chest pain or the sensation of palpitations.  She does have some fatigue.  She reports a history of anemia.  She denies unusual bruising or bleeding.  She has had no nausea/vomiting/diarrhea or constipation.  She has had some increased burping/belching.  No peripheral edema.  She continues to have painful numbness and tingling in her feet related to diabetes.  She does not feel that the gabapentin is working and she would like to try Lyrica.                    Past Medical History:  Diagnosis Date  . Anemia   . Diabetic neuropathy (HCC)   . Type 2 diabetes mellitus (HCC) 2014    Past Surgical History:  Procedure Laterality Date  . NO PAST SURGERIES      Family History  Problem Relation Age of Onset  . Diabetes Mother   . Diabetes Father   . Hypertension Father   . Heart disease Father   . Breast cancer Paternal Aunt   . Cancer Neg Hx     Social History   Tobacco Use  . Smoking status: Former Smoker    Quit date: 02/2019    Years since quitting: 0.4  . Smokeless tobacco: Never Used  Substance Use Topics  . Alcohol use: Yes    Comment: occ  . Drug use: No     No Known Allergies     Observations/Objective: No vital  signs or physical exam conducted as visit was done via telephone  Assessment and Plan: 1. Myalgia Patient with complaint of myalgias and would like a refill of Flexeril to help with muscle spasms and patient reports that muscle pain in the past improved with the use of duloxetine which was also refilled at today's visit.  He started lifestyle - cyclobenzaprine (FLEXERIL) 10 MG tablet; Take 1 tablet (10 mg total) by mouth 2 (two) times daily as needed for muscle spasms.  Dispense: 60 tablet; Refill: 1 - DULoxetine (CYMBALTA) 60 MG capsule; Take 1 capsule (60 mg total) by mouth daily.  Dispense: 30 capsule; Refill: 3  2. Uncontrolled type 2 diabetes mellitus with peripheral  neuropathy (HCC) Most recent hemoglobin A1c was 8.0 on 06/27/2019 which is improved from 10.0 on 04/20/2019.  Discussed A1c goal of 7 or less.  Refills provided of patient's glipizide and Metformin.   Lyrica to help with neuropathic pain.  Goal glucose of 120 or less fasting and 140 or less within 2 hours of a meal.  BMP in follow-up of diabetes.  Diabetic foot care discussed. - glipiZIDE (GLUCOTROL) 10 MG tablet; Take 1 tablet (10 mg total) by mouth 2 (two) times daily before a meal.  Dispense: 60 tablet; Refill: 3 - metFORMIN (GLUCOPHAGE) 500 MG tablet; Take 1 tablet (500 mg total) by mouth 2 (two) times daily with a meal.  Dispense: 60 tablet; Refill: 2 - pregabalin (LYRICA) 100 MG capsule; Take 1 capsule (100 mg total) by mouth 2 (two) times daily. Discontinue gabapentin once Lyrica is obtained  Dispense: 60 capsule; Refill: 3 - Basic Metabolic Panel; Future  3. Gastroesophageal reflux disease, unspecified whether esophagitis present Prescription provided for Pepcid in addition to pantoprazole.  Avoid nonsteroidal anti-inflammatories, known trigger foods as well as eating within 2 hours of bedtime.  Return sooner if symptoms are not improving. - famotidine (PEPCID) 20 MG tablet; Take 1 tablet (20 mg total) by mouth 2 (two) times daily.  Dispense: 30 tablet; Refill: 1 - pantoprazole (PROTONIX) 40 MG tablet; Take 1 tablet (40 mg total) by mouth daily.  Dispense: 30 tablet; Refill: 2  4. Anxiety as acute reaction to exceptional stress Sertraline 25 mg at bedtime is being added to current Cymbalta to help with anxiety.  She has had increased anxiety due to the COVID-19 pandemic and patient is no longer eligible for unemployment benefits. - sertraline (ZOLOFT) 25 MG tablet; Take 1 tablet (25 mg total) by mouth daily.  Dispense: 30 tablet; Refill: 1  5. Tachycardia We will check labs in follow-up of tachycardia including T4/TSH to look for thyroid disorder, CBC to look for anemia and BMP to look for  electrolyte abnormality.  She is encouraged to remain well-hydrated.  Tachycardia may also be related to her anxiety. - T4 AND TSH; Future - CBC; Future - Basic Metabolic Panel; Future  6. Financial difficulty She reports issues with financial difficulty related to being unemployed due to the current COVID-19 pandemic.  Patient was offered referral to social work which she declined at today's visit but she was made aware that she may call or return if she is interested in this option.  7. Normocytic anemia Hemoglobin of 10.7 on CBC done 06/04/2019 and CBC will be repeated at today's visit and follow-up of anemia. She will be notified if further treatment is needed.  - CBC; Future  Follow Up Instructions:Return in about 4 weeks (around 08/24/2019) for anxiety/new med.    I discussed the assessment and treatment  plan with the patient. The patient was provided an opportunity to ask questions and all were answered. The patient agreed with the plan and demonstrated an understanding of the instructions.   The patient was advised to call back or seek an in-person evaluation if the symptoms worsen or if the condition fails to improve as anticipated.   A total of 18 minutes of nonface-to-face time was spent with the patient during today's encounter.  An additional 10-12 minutes was spent on review of chart, ordering of medications, labs and note completion.  Antony Blackbird, MD

## 2019-07-31 LAB — CYTOLOGY - PAP
Comment: NEGATIVE
Diagnosis: NEGATIVE
High risk HPV: NEGATIVE

## 2019-08-02 ENCOUNTER — Other Ambulatory Visit: Payer: Self-pay

## 2019-08-02 ENCOUNTER — Ambulatory Visit: Payer: Self-pay | Attending: Family Medicine

## 2019-08-02 DIAGNOSIS — D649 Anemia, unspecified: Secondary | ICD-10-CM

## 2019-08-02 DIAGNOSIS — R000 Tachycardia, unspecified: Secondary | ICD-10-CM

## 2019-08-02 DIAGNOSIS — IMO0002 Reserved for concepts with insufficient information to code with codable children: Secondary | ICD-10-CM

## 2019-08-02 DIAGNOSIS — E1165 Type 2 diabetes mellitus with hyperglycemia: Secondary | ICD-10-CM

## 2019-08-03 ENCOUNTER — Telehealth: Payer: Self-pay | Admitting: Family Medicine

## 2019-08-03 LAB — BASIC METABOLIC PANEL WITH GFR
BUN/Creatinine Ratio: 14 (ref 9–23)
BUN: 11 mg/dL (ref 6–20)
CO2: 21 mmol/L (ref 20–29)
Calcium: 9 mg/dL (ref 8.7–10.2)
Chloride: 98 mmol/L (ref 96–106)
Creatinine, Ser: 0.76 mg/dL (ref 0.57–1.00)
GFR calc Af Amer: 117 mL/min/1.73
GFR calc non Af Amer: 101 mL/min/1.73
Glucose: 230 mg/dL — ABNORMAL HIGH (ref 65–99)
Potassium: 4.4 mmol/L (ref 3.5–5.2)
Sodium: 138 mmol/L (ref 134–144)

## 2019-08-03 LAB — CBC
Hematocrit: 39.8 % (ref 34.0–46.6)
Hemoglobin: 12.9 g/dL (ref 11.1–15.9)
MCH: 27.7 pg (ref 26.6–33.0)
MCHC: 32.4 g/dL (ref 31.5–35.7)
MCV: 86 fL (ref 79–97)
Platelets: 223 x10E3/uL (ref 150–450)
RBC: 4.65 x10E6/uL (ref 3.77–5.28)
RDW: 13.1 % (ref 11.7–15.4)
WBC: 10.1 x10E3/uL (ref 3.4–10.8)

## 2019-08-03 LAB — T4 AND TSH
T4, Total: 7.8 ug/dL (ref 4.5–12.0)
TSH: 0.811 u[IU]/mL (ref 0.450–4.500)

## 2019-08-03 NOTE — Telephone Encounter (Signed)
Results has not been looked over by PCP. Patient will be called once results are in epic.

## 2019-08-03 NOTE — Telephone Encounter (Signed)
Patient called requesting to speak with her nurse regarding her results. Patient states she saw them on Mychart but does not understand. Please f/u

## 2019-08-07 ENCOUNTER — Ambulatory Visit: Payer: Self-pay | Attending: Family Medicine | Admitting: Pharmacist

## 2019-08-07 ENCOUNTER — Other Ambulatory Visit: Payer: Self-pay

## 2019-08-07 DIAGNOSIS — E1165 Type 2 diabetes mellitus with hyperglycemia: Secondary | ICD-10-CM

## 2019-08-07 DIAGNOSIS — IMO0002 Reserved for concepts with insufficient information to code with codable children: Secondary | ICD-10-CM

## 2019-08-07 DIAGNOSIS — E1142 Type 2 diabetes mellitus with diabetic polyneuropathy: Secondary | ICD-10-CM

## 2019-08-07 MED ORDER — TRULICITY 0.75 MG/0.5ML ~~LOC~~ SOAJ: 0.7500 mg | SUBCUTANEOUS | 2 refills | Status: DC

## 2019-08-07 MED FILL — !TRULICITY 0.75 MG/0.5 ML P: 0.75 | 30 days supply | Qty: 2 | Fill #0

## 2019-08-07 NOTE — Progress Notes (Addendum)
    S:    PCP: Dr. Margarita Rana  No chief complaint on file.  Patient arrives in good spirits.  Presents for diabetes evaluation, education, and management Patient was referred on 07/27/19 by Dr. Chapman Fitch.  Patient was last seen by Primary Care Provider on 06/27/19.   Family/Social History:  - Fhx: DM, heart disease, HTN - Tobacco: former smoker - Alcohol: occasionally   Insurance coverage/medication affordability: Dnbi  Patient reports adherence with medications.  Current diabetes medications include: metformin 500 mg BID (could not tolerate higher doses d/t diarrhea), glipizide 10 mg BID  Patient denies hypoglycemic events.  Patient reported dietary habits:  - Admits to dietary indiscretion   Patient-reported exercise habits:  - Walks her dogs occasionally    Patient denies nocturia (nighttime urination).  Patient reports neuropathy (nerve pain). Patient reports visual changes. Patient reports self foot exams.   No hx/fhx of thyroid cancer. No hx pancreatitis.    O:  POCT glucose: 271  Lab Results  Component Value Date   HGBA1C 8.0 (A) 06/27/2019   There were no vitals filed for this visit.  Lipid Panel     Component Value Date/Time   CHOL 203 (H) 08/07/2019 1421   TRIG 699 (HH) 08/07/2019 1421   HDL 41 08/07/2019 1421   CHOLHDL 5.0 (H) 08/07/2019 1421   LDLCALC 58 08/07/2019 1421   Home fasting blood sugars: 180s-190s  2 hour post-meal/random blood sugars: 200-300s.  Clinical Atherosclerotic Cardiovascular Disease (ASCVD): No  The ASCVD Risk score Mikey Bussing DC Jr., et al., 2013) failed to calculate for the following reasons:   The 2013 ASCVD risk score is only valid for ages 59 to 53   A/P: Diabetes longstanding currently uncontrolled. Patient is able to verbalize appropriate hypoglycemia management plan. Patient is adherent with medication. Control is suboptimal due to dietary indiscretion and some physical inactivity. Pt is interested in a medication that could  possibly aid in weight loss and lowering ASCVD risk. Will add Trulicity. -Continue oral medications for now.  -Begin Trulicity 4.74 mg weekly. -Extensively discussed pathophysiology of diabetes, recommended lifestyle interventions, dietary effects on blood sugar control -Counseled on s/sx of and management of hypoglycemia -Next A1C anticipated 09/2019.   Written patient instructions provided.  Total time in face to face counseling 30 minutes.   Follow up Pharmacist Clinic Visit in 1 month.  Benard Halsted, PharmD, [place] [phone]

## 2019-08-07 NOTE — Patient Instructions (Signed)
Thank you for coming to see me today. Please do the following:  1. Start Trulicity injections once weekly.  2. Continue metformin 500 mg twice daily. Continue glipizide 10 mg twice daily.   3. Continue checking blood sugars at home.  4. Continue making the lifestyle changes we've discussed together during our visit. Diet and exercise play a significant role in improving your blood sugars.  5. Follow-up with me over the phone in 1 month.    Hypoglycemia or low blood sugar:   Low blood sugar can happen quickly and may become an emergency if not treated right away.   While this shouldn't happen often, it can be brought upon if you skip a meal or do not eat enough. Also, if your insulin or other diabetes medications are dosed too high, this can cause your blood sugar to go to low.   Warning signs of low blood sugar include: 1. Feeling shaky or dizzy 2. Feeling weak or tired  3. Excessive hunger 4. Feeling anxious or upset  5. Sweating even when you aren't exercising  What to do if I experience low blood sugar? 1. Check your blood sugar with your meter. If lower than 70, proceed to step 2.  2. Treat with 3-4 glucose tablets or 3 packets of regular sugar. If these aren't around, you can try hard candy. Yet another option would be to drink 4 ounces of fruit juice or 6 ounces of REGULAR soda.  3. Re-check your sugar in 15 minutes. If it is still below 70, do what you did in step 2 again. If has come back up, go ahead and eat a snack or small meal at this time.

## 2019-08-08 ENCOUNTER — Encounter: Payer: Self-pay | Admitting: Pharmacist

## 2019-08-08 LAB — LIPID PANEL
Chol/HDL Ratio: 5 ratio — ABNORMAL HIGH (ref 0.0–4.4)
Cholesterol, Total: 203 mg/dL — ABNORMAL HIGH (ref 100–199)
HDL: 41 mg/dL
LDL Chol Calc (NIH): 58 mg/dL (ref 0–99)
Triglycerides: 699 mg/dL (ref 0–149)
VLDL Cholesterol Cal: 104 mg/dL — ABNORMAL HIGH (ref 5–40)

## 2019-08-09 ENCOUNTER — Telehealth: Payer: Self-pay | Admitting: Pharmacist

## 2019-08-09 NOTE — Telephone Encounter (Signed)
Call placed to discuss results. Pt was unavailable; left HIPAA-compliant VM with instruction to return call.

## 2019-08-14 NOTE — Telephone Encounter (Signed)
Pt is amenable to lipid lowering therapy. I recommend at least moderate intensity statin along with fenofibrate.

## 2019-08-14 NOTE — Telephone Encounter (Signed)
Please see if you can contact her again and if not then please let me know

## 2019-08-14 NOTE — Telephone Encounter (Signed)
Pt contacted. Verified I was speaking to her using two identifiers. Results from lipid panel discussed. Pt is amenable to discussing a medication such as fenofibrate at follow-up.

## 2019-08-14 NOTE — Telephone Encounter (Signed)
Please ask patient if she is interested in being on medication to lower her cholesterol based on her recent labs

## 2019-08-15 NOTE — Telephone Encounter (Signed)
I reviewed our records in our pharmacy, which is patient's preferred pharmacy. No lipid lowering medications in her profile.

## 2019-08-15 NOTE — Telephone Encounter (Signed)
Can you check and see if I sent something in for her recently?

## 2019-08-15 NOTE — Telephone Encounter (Signed)
Ok, I have to respond to so many, I was thinking that I may have sent in Crestor for her but I will review and send in medication today; thank you for checking

## 2019-08-20 ENCOUNTER — Telehealth (HOSPITAL_COMMUNITY): Payer: Self-pay | Admitting: *Deleted

## 2019-08-20 NOTE — Telephone Encounter (Signed)
Pap smear result letter mailed to patient by [facility]. 

## 2019-08-21 ENCOUNTER — Encounter: Payer: No Typology Code available for payment source | Admitting: Family Medicine

## 2019-08-23 MED FILL — PREGABALIN 100 MG CAPS: 100 | 30 days supply | Qty: 60 | Fill #1

## 2019-08-23 MED FILL — ?DULOXETINE HCL 60 MG CPEP: 60 | 30 days supply | Qty: 30 | Fill #1

## 2019-08-23 MED FILL — ?SERTRALINE HCL 25MG TABS: 25 | 30 days supply | Qty: 30 | Fill #1

## 2019-08-23 MED FILL — ?METFORMIN HCL 500MG TABLET: 500 | 30 days supply | Qty: 60 | Fill #1

## 2019-08-23 MED FILL — PANTOPRAZOLE SOD DR 40 MG T: 40 | 30 days supply | Qty: 30 | Fill #1

## 2019-08-23 MED FILL — CYCLOBENZAPRINE 10 MG TAB: 10 | 30 days supply | Qty: 60 | Fill #1

## 2019-08-23 MED FILL — ?GLIPIZIDE 10 MG TABLET: 10 | 30 days supply | Qty: 60 | Fill #1

## 2019-08-28 MED FILL — CYCLOBENZAPRINE 10 MG TAB: 10 | 30 days supply | Qty: 60 | Fill #1

## 2019-08-28 MED FILL — PANTOPRAZOLE SOD DR 40 MG T: 40 | 30 days supply | Qty: 30 | Fill #1

## 2019-08-29 MED FILL — TRULICITY 0.75 MG/0.5 ML PE: 0.75 | 30 days supply | Qty: 2 | Fill #1

## 2019-09-03 ENCOUNTER — Other Ambulatory Visit: Payer: Self-pay | Admitting: Family Medicine

## 2019-09-03 ENCOUNTER — Telehealth: Payer: Self-pay | Admitting: Pharmacist

## 2019-09-03 DIAGNOSIS — E1142 Type 2 diabetes mellitus with diabetic polyneuropathy: Secondary | ICD-10-CM

## 2019-09-03 MED ORDER — GABAPENTIN 300 MG PO CAPS: 600.0000 mg | ORAL_CAPSULE | Freq: Three times a day (TID) | ORAL | 4 refills | Status: DC

## 2019-09-03 NOTE — Telephone Encounter (Signed)
PA for Lyrica denied. Patient wishes to be placed back on gabapentin but [facility] pharmacy needs a new rx. Dr. Jillyn Hidden can you send?

## 2019-09-03 NOTE — Telephone Encounter (Signed)
Prescription sent to [facility] pharmacy for gabapentin 300 mg, 2 pills  (600 mg) three times daily

## 2019-09-03 NOTE — Progress Notes (Signed)
Patient ID: Maria Morris, female   DOB: 1981-08-20, 39 y.o.   MRN: [id number]   Note received from pharmacist that patient's Lyrica preauthorization was denied and patient would like to be placed back on gabapentin.  On review of chart, her last dose of gabapentin was 300 mg 2 pills 3 times daily and new order will be placed for this medication to [facility] pharmacy.

## 2019-09-04 MED FILL — GABAPENTIN 300 MG CAPSULE: 300 | 30 days supply | Qty: 180 | Fill #0

## 2019-09-07 ENCOUNTER — Other Ambulatory Visit: Payer: Self-pay

## 2019-09-07 ENCOUNTER — Ambulatory Visit: Payer: Self-pay | Attending: Family Medicine | Admitting: Pharmacist

## 2019-09-07 ENCOUNTER — Other Ambulatory Visit: Payer: Self-pay | Admitting: Family Medicine

## 2019-09-07 DIAGNOSIS — IMO0002 Reserved for concepts with insufficient information to code with codable children: Secondary | ICD-10-CM

## 2019-09-07 DIAGNOSIS — E1142 Type 2 diabetes mellitus with diabetic polyneuropathy: Secondary | ICD-10-CM

## 2019-09-07 MED ORDER — PREGABALIN 100 MG PO CAPS: 100.0000 mg | ORAL_CAPSULE | Freq: Two times a day (BID) | ORAL | 1 refills | Status: DC

## 2019-09-07 NOTE — Progress Notes (Signed)
    S:    PCP: Dr. Alvis Lemmings  No chief complaint on file.  Patient arrives in good spirits.  Presents for diabetes evaluation, education, and management Patient was referred on 07/27/19 by Dr. Jillyn Hidden.  Patient was last seen by Primary Care Provider on 06/27/19.   Family/Social History:  - Fhx: DM, heart disease, HTN - Tobacco: former smoker - Alcohol: occasionally   Insurance coverage/medication affordability: Dnbi  Patient reports adherence with medications.  Current diabetes medications include: metformin 500 mg BID (could not tolerate higher doses d/t diarrhea), glipizide 10 mg BID  Patient denies hypoglycemic events.  Patient reported dietary habits:  - Admits to dietary indiscretion  - She is making some improvements and is decreasing portion sizes, however, she continues to struggle with sweets  Patient-reported exercise habits:  - Walks her dogs occasionally    Patient denies nocturia (nighttime urination).  Patient reports neuropathy (nerve pain). Patient reports visual changes. Patient reports self foot exams.   No hx/fhx of thyroid cancer. No hx pancreatitis.    O:   Lab Results  Component Value Date   HGBA1C 8.0 (A) 06/27/2019   There were no vitals filed for this visit.  Lipid Panel     Component Value Date/Time   CHOL 203 (H) 08/07/2019 1421   TRIG 699 (HH) 08/07/2019 1421   HDL 41 08/07/2019 1421   CHOLHDL 5.0 (H) 08/07/2019 1421   LDLCALC 58 08/07/2019 1421   Home fasting blood sugars: 109 - 140s. 2 hour post-meal/random blood sugars: 200 - 250  Clinical Atherosclerotic Cardiovascular Disease (ASCVD): No  The ASCVD Risk score Denman George DC Jr., et al., 2013) failed to calculate for the following reasons:   The 2013 ASCVD risk score is only valid for ages 47 to 43   A/P: Diabetes longstanding currently uncontrolled. Patient is able to verbalize appropriate hypoglycemia management plan. Patient is adherent with medication. Control is suboptimal due to  dietary indiscretion and some physical inactivity. I recommend to increase Trulicity to 1.5 mg weekly.  -Continue oral medications for now.  -Increase Trulicity to 1.5 mg weekly. -Extensively discussed pathophysiology of diabetes, recommended lifestyle interventions, dietary effects on blood sugar control -Counseled on s/sx of and management of hypoglycemia -Next A1C anticipated 09/2019.   Written patient instructions provided.  Total time in face to face counseling 30 minutes.   Follow up Pharmacist Clinic Visit in 1 month.  Butch Penny, PharmD, CPP Clinical Pharmacist [facility] [phone]

## 2019-09-10 MED ORDER — TRULICITY 1.5 MG/0.5ML ~~LOC~~ SOAJ: 1.5000 mg | SUBCUTANEOUS | 2 refills | Status: DC

## 2019-09-10 NOTE — Addendum Note (Signed)
Addended by: Lois Huxley, Jeannett Senior L on: 09/10/2019 08:09 AM   Modules accepted: Orders

## 2019-09-26 ENCOUNTER — Other Ambulatory Visit: Payer: Self-pay | Admitting: Family Medicine

## 2019-09-26 DIAGNOSIS — F411 Generalized anxiety disorder: Secondary | ICD-10-CM

## 2019-09-26 DIAGNOSIS — F430 Acute stress reaction: Secondary | ICD-10-CM

## 2019-09-26 MED FILL — metFORMIN HCL 500 MG TABS: 500 | 30 days supply | Qty: 60 | Fill #2

## 2019-09-26 MED FILL — CYCLOBENZAPRINE 10 MG TAB: 10 | 30 days supply | Qty: 60 | Fill #1

## 2019-09-26 MED FILL — PANTOPRAZOLE SOD DR 40 MG T: 40 | 30 days supply | Qty: 30 | Fill #2

## 2019-09-26 MED FILL — TRULICITY 0.75 MG/0.5 ML PE: 0.75 | 30 days supply | Qty: 2 | Fill #2

## 2019-09-26 MED FILL — DULoxetine HCL 60 MG CPEP: 60 | 30 days supply | Qty: 30 | Fill #2

## 2019-09-26 MED FILL — glipiZIDE 10 MG TABS: 10 | 30 days supply | Qty: 60 | Fill #2

## 2019-09-27 MED FILL — ?SERTRALINE HCL 25MG TABS: 25 | 30 days supply | Qty: 30 | Fill #0

## 2019-09-28 MED FILL — SERTRALINE HCL 25 MG TABLET: 25 | 30 days supply | Qty: 30 | Fill #1

## 2019-10-05 ENCOUNTER — Ambulatory Visit: Payer: Self-pay | Admitting: General Surgery

## 2019-10-08 ENCOUNTER — Encounter (HOSPITAL_COMMUNITY): Payer: Self-pay

## 2019-10-08 ENCOUNTER — Other Ambulatory Visit: Payer: Self-pay

## 2019-10-08 ENCOUNTER — Ambulatory Visit: Payer: 59 | Attending: Family Medicine | Admitting: Pharmacist

## 2019-10-08 DIAGNOSIS — IMO0002 Reserved for concepts with insufficient information to code with codable children: Secondary | ICD-10-CM

## 2019-10-08 DIAGNOSIS — E1142 Type 2 diabetes mellitus with diabetic polyneuropathy: Secondary | ICD-10-CM

## 2019-10-08 MED FILL — TRULICITY 1.5 MG/0.5 ML PEN: 1.5 | 55 days supply | Qty: 2 | Fill #0

## 2019-10-08 NOTE — Progress Notes (Signed)
S:    PCP: Dr. Alvis Lemmings  No chief complaint on file.  Patient arrives in good spirits.  Presents for diabetes evaluation, education, and management Patient was referred on 07/27/19 by Dr. Jillyn Hidden.  I have been seeing her since then for DM education and medication adjustment. At our last visit, we increased Trulicity but patient continued the 0.75 mg weekly dose instead.   Additionally, patient reports back pain today in her lower back that she rates as a 10/10. Reports that she fell while walking her dogs ~2 weeks ago. She reports that OTC NSAIDs have not been helpful. Additionally, she has continued GERD/epigastric pain that she attributes to her gallbladder. She has a planned laparoscopic cholecystectomy scheduled for 10/16/19.  Family/Social History:  - Fhx: DM, heart disease, HTN - Tobacco: former smoker - Alcohol: occasionally   Insurance coverage/medication affordability: Dnbi  Patient denies adherence with medications.  Current diabetes medications include: metformin 500 mg BID (could not tolerate higher doses d/t diarrhea), glipizide 10 mg BID, Trulicity 1.5 mg weekly (continues 0.75mg )  Patient denies hypoglycemic events.  Patient reported dietary habits:  - Admits to dietary indiscretion  - She is making some improvements and is decreasing portion sizes, however, she continues to struggle with sweets  Patient-reported exercise habits:  - Walks her dogs occasionally  - Limited by back pain   Patient denies nocturia (nighttime urination).  Patient reports neuropathy (nerve pain). Patient reports visual changes. Patient reports self foot exams.   No hx/fhx of thyroid cancer. No hx pancreatitis.    O:   Lab Results  Component Value Date   HGBA1C 8.0 (A) 06/27/2019   There were no vitals filed for this visit.  Lipid Panel     Component Value Date/Time   CHOL 203 (H) 08/07/2019 1421   TRIG 699 (HH) 08/07/2019 1421   HDL 41 08/07/2019 1421   CHOLHDL 5.0 (H)  08/07/2019 1421   LDLCALC 58 08/07/2019 1421   Home fasting blood sugars: 190 - 200s 2 hour post-meal/random blood sugars: 200s  Clinical Atherosclerotic Cardiovascular Disease (ASCVD): No  The ASCVD Risk score Denman George DC Jr., et al., 2013) failed to calculate for the following reasons:   The 2013 ASCVD risk score is only valid for ages 7 to 34   A/P: Diabetes longstanding currently uncontrolled. Patient is able to verbalize appropriate hypoglycemia management plan. Patient is not adherent with medication. I have encouraged her to pick up and start the 1.5 mg weekly dose of Trulicity. She is amenable to do so.   Unfortunately, her CBGs at home have increased since last visit. Will have her take increased Trulicity dose. I also recommended she present to UC for further evaluation of her back pain with no provider available to eval in clinic today. She also has surgery upcoming. Will hold off on any further regimen changes. I did inform her that we may have to employ the use of insulin depending on how her sugars/A1c respond. Her A1c is being tested as a part of pre-admission testing tomorrow.   -Continue oral medications for now.  -Increase Trulicity to 1.5 mg weekly. -Extensively discussed pathophysiology of diabetes, recommended lifestyle interventions, dietary effects on blood sugar control -Counseled on s/sx of and management of hypoglycemia -Next A1C anticipated tomorrow.   Written patient instructions provided.  Total time in face to face counseling 30 minutes.   Follow up Pharmacist Clinic Visit after further eval from PCP.   Butch Penny, PharmD, CPP Clinical Pharmacist [facility]  [place] [phone]

## 2019-10-08 NOTE — Patient Instructions (Addendum)
DUE TO COVID-19 ONLY ONE VISITOR IS ALLOWED TO COME WITH YOU AND STAY IN THE WAITING ROOM ONLY DURING PRE OP AND PROCEDURE. THE ONE VISITOR MAY VISIT WITH YOU IN YOUR PRIVATE ROOM DURING VISITING HOURS ONLY!!   COVID SWAB TESTING MUST BE COMPLETED ON: Friday, Feb. 19, 2021 at 9:50 AM     [address] [place] -[facility] enter pre surgical testing line (Must self quarantine after testing. Follow instructions on handout.)             Your procedure is scheduled on: Tuesday, Feb. 23, 2021   Report to [facility] Main  Entrance    Report to admitting at 9:00 AM   Call this number if you have problems the morning of surgery [phone]   Do not eat food:After Midnight.   May have liquids until 8:00AM day of surgery   CLEAR LIQUID DIET  Foods Allowed                                                                     Foods Excluded  Water, Black Coffee and tea, regular and decaf                             liquids that you cannot  Plain Jell-O in any flavor  (No red)                                           see through such as: Fruit ices (not with fruit pulp)                                     milk, soups, orange juice  Iced Popsicles (No red)                                    All solid food Carbonated beverages, regular and diet                                    Apple juices Sports drinks like Gatorade (No red) Lightly seasoned clear broth or consume(fat free) Sugar, honey syrup  Sample Menu Breakfast                                Lunch                                     Supper Cranberry juice                    Beef broth                            Chicken broth Jell-O  Grape juice                           Apple juice Coffee or tea                        Jell-O                                      Popsicle                                                Coffee or tea                        Coffee or  tea   Complete one G2 drink the morning of surgery at 8:00AM the day of surgery.   Oral Hygiene is also important to reduce your risk of infection.                                    Remember - BRUSH YOUR TEETH THE MORNING OF SURGERY WITH YOUR REGULAR TOOTHPASTE   Do NOT smoke after Midnight   Take these medicines the morning of surgery with A SIP OF WATER: Duloxetine, Gabapentin, Sertraline  DO NOT TAKE ANY DIABETIC MEDICATIONS DAY OF YOUR SURGERY                               You may not have any metal on your body including hair pins, jewelry, and body piercings             Do not wear make-up, lotions, powders, perfumes/cologne, or deodorant             Do not wear nail polish.  Do not shave  48 hours prior to surgery.                Do not bring valuables to the hospital. [place].   Contacts, dentures or bridgework may not be worn into surgery.    Patients discharged the day of surgery will not be allowed to drive home.   Special Instructions: Bring a copy of your healthcare power of attorney and living will documents         the day of surgery if you haven't scanned them in before.              Please read over the following fact sheets you were given:  How to Manage Your Diabetes Before and After Surgery  Why is it important to control my blood sugar before and after surgery? . Improving blood sugar levels before and after surgery helps healing and can limit problems. . A way of improving blood sugar control is eating a healthy diet by: o  Eating less sugar and carbohydrates o  Increasing activity/exercise o  Talking with your doctor about reaching your blood sugar goals . High blood sugars (greater than 180 mg/dL) can raise your risk of infections and slow  your recovery, so you will need to focus on controlling your diabetes during the weeks before surgery. . Make sure that the doctor who takes care of your diabetes  knows about your planned surgery including the date and location.  How do I manage my blood sugar before surgery? . Check your blood sugar at least 4 times a day, starting 2 days before surgery, to make sure that the level is not too high or low. o Check your blood sugar the morning of your surgery when you wake up and every 2 hours until you get to the Short Stay unit. . If your blood sugar is less than 70 mg/dL, you will need to treat for low blood sugar: o Do not take insulin. o Treat a low blood sugar (less than 70 mg/dL) with  cup of clear juice (cranberry or apple), 4 glucose tablets, OR glucose gel. o Recheck blood sugar in 15 minutes after treatment (to make sure it is greater than 70 mg/dL). If your blood sugar is not greater than 70 mg/dL on recheck, call [phone] for further instructions. . Report your blood sugar to the short stay nurse when you get to Short Stay.  . If you are admitted to the hospital after surgery: o Your blood sugar will be checked by the staff and you will probably be given insulin after surgery (instead of oral diabetes medicines) to make sure you have good blood sugar levels. o The goal for blood sugar control after surgery is 80-180 mg/dL.   WHAT DO I DO ABOUT MY DIABETES MEDICATION?  Marland Kitchen Do not take oral diabetes medicines (pills) the morning of surgery.     . THE DAY BEFORE SURGERY TAKE METFORMIN PER NORMAL ROUTINE   THE NIGHT BEFORE SURGERY, DO NOT TAKE EVENING DOSE OF  GLIPIZIDE.   Marland Kitchen The day of surgery, do not take other diabetes injectables, including Byetta (exenatide), Bydureon (exenatide ER), Victoza (liraglutide), or Trulicity (dulaglutide).  Reviewed and Endorsed by [facility] Patient Education Committee, August 2015  [facility] - Preparing for Surgery Before surgery, you can play an important role.  Because skin is not sterile, your skin needs to be as free of germs as possible.  You can reduce the number of germs on your skin by  washing with CHG (chlorahexidine gluconate) soap before surgery.  CHG is an antiseptic cleaner which kills germs and bonds with the skin to continue killing germs even after washing. Please DO NOT use if you have an allergy to CHG or antibacterial soaps.  If your skin becomes reddened/irritated stop using the CHG and inform your nurse when you arrive at Short Stay. Do not shave (including legs and underarms) for at least 48 hours prior to the first CHG shower.  You may shave your face/neck.  Please follow these instructions carefully:  1.  Shower with CHG Soap the night before surgery and the  morning of surgery.  2.  If you choose to wash your hair, wash your hair first as usual with your normal  shampoo.  3.  After you shampoo, rinse your hair and body thoroughly to remove the shampoo.                             4.  Use CHG as you would any other liquid soap.  You can apply chg directly to the skin and wash.  Gently with a scrungie or clean washcloth.  5.  Apply the CHG Soap to your body ONLY FROM THE NECK DOWN.   Do   not use on face/ open                           Wound or open sores. Avoid contact with eyes, ears mouth and   genitals (private parts).                       Wash face,  Genitals (private parts) with your normal soap.             6.  Wash thoroughly, paying special attention to the area where your    surgery  will be performed.  7.  Thoroughly rinse your body with warm water from the neck down.  8.  DO NOT shower/wash with your normal soap after using and rinsing off the CHG Soap.                9.  Pat yourself dry with a clean towel.            10.  Wear clean pajamas.            11.  Place clean sheets on your bed the night of your first shower and do not  sleep with pets. Day of Surgery : Do not apply any lotions/deodorants the morning of surgery.  Please wear clean clothes to the hospital/surgery center.  FAILURE TO FOLLOW THESE INSTRUCTIONS MAY RESULT IN THE CANCELLATION  OF YOUR SURGERY  PATIENT SIGNATURE_________________________________  NURSE SIGNATURE__________________________________  ________________________________________________________________________

## 2019-10-09 ENCOUNTER — Encounter (HOSPITAL_COMMUNITY)
Admission: RE | Admit: 2019-10-09 | Discharge: 2019-10-09 | Disposition: A | Discharge status: Home / Self Care | Payer: 59 | Source: Ambulatory Visit | Attending: General Surgery | Admitting: General Surgery

## 2019-10-09 ENCOUNTER — Other Ambulatory Visit: Payer: Self-pay

## 2019-10-09 ENCOUNTER — Encounter (HOSPITAL_COMMUNITY): Payer: Self-pay

## 2019-10-09 DIAGNOSIS — E118 Type 2 diabetes mellitus with unspecified complications: Secondary | ICD-10-CM | POA: Diagnosis not present

## 2019-10-09 DIAGNOSIS — K219 Gastro-esophageal reflux disease without esophagitis: Secondary | ICD-10-CM | POA: Insufficient documentation

## 2019-10-09 DIAGNOSIS — Z87891 Personal history of nicotine dependence: Secondary | ICD-10-CM | POA: Diagnosis not present

## 2019-10-09 DIAGNOSIS — Z01812 Encounter for preprocedural laboratory examination: Secondary | ICD-10-CM | POA: Insufficient documentation

## 2019-10-09 DIAGNOSIS — F419 Anxiety disorder, unspecified: Secondary | ICD-10-CM | POA: Insufficient documentation

## 2019-10-09 DIAGNOSIS — Z794 Long term (current) use of insulin: Secondary | ICD-10-CM | POA: Insufficient documentation

## 2019-10-09 DIAGNOSIS — D649 Anemia, unspecified: Secondary | ICD-10-CM | POA: Diagnosis not present

## 2019-10-09 DIAGNOSIS — K811 Chronic cholecystitis: Secondary | ICD-10-CM | POA: Diagnosis not present

## 2019-10-09 DIAGNOSIS — Z79899 Other long term (current) drug therapy: Secondary | ICD-10-CM | POA: Diagnosis not present

## 2019-10-09 HISTORY — DX: Anxiety disorder, unspecified: F41.9

## 2019-10-09 HISTORY — DX: Polyneuropathy, unspecified: G62.9

## 2019-10-09 HISTORY — DX: Migraine, unspecified, not intractable, without status migrainosus: G43.909

## 2019-10-09 HISTORY — DX: Gastro-esophageal reflux disease without esophagitis: K21.9

## 2019-10-09 HISTORY — DX: Calculus of gallbladder without cholecystitis without obstruction: K80.20

## 2019-10-09 HISTORY — DX: Tachycardia, unspecified: R00.0

## 2019-10-09 LAB — BASIC METABOLIC PANEL
Anion gap: 12 (ref 5–15)
BUN: 14 mg/dL (ref 6–20)
CO2: 29 mmol/L (ref 22–32)
Calcium: 9.8 mg/dL (ref 8.9–10.3)
Chloride: 99 mmol/L (ref 98–111)
Creatinine, Ser: 0.81 mg/dL (ref 0.44–1.00)
GFR calc Af Amer: >60 mL/min (ref >60)
GFR calc non Af Amer: >60 mL/min (ref >60)
Glucose, Bld: 328 mg/dL — ABNORMAL HIGH (ref 70–99)
Potassium: 4.7 mmol/L (ref 3.5–5.1)
Sodium: 140 mmol/L (ref 135–145)

## 2019-10-09 LAB — CBC
HCT: 40 % (ref 36.0–46.0)
Hemoglobin: 13 g/dL (ref 12.0–15.0)
MCH: 28.3 pg (ref 26.0–34.0)
MCHC: 32.5 g/dL (ref 30.0–36.0)
MCV: 87.1 fL (ref 80.0–100.0)
Platelets: 232 10*3/uL (ref 150–400)
RBC: 4.59 MIL/uL (ref 3.87–5.11)
RDW: 14.8 % (ref 11.5–15.5)
WBC: 10.1 10*3/uL (ref 4.0–10.5)
nRBC: 0 % (ref 0.0–0.2)

## 2019-10-09 LAB — HEMOGLOBIN A1C
Hgb A1c MFr Bld: 10.1 % — ABNORMAL HIGH (ref 4.8–5.6)
Mean Plasma Glucose: 243.17 mg/dL

## 2019-10-09 LAB — GLUCOSE, CAPILLARY: Glucose-Capillary: 299 mg/dL — ABNORMAL HIGH (ref 70–99)

## 2019-10-09 NOTE — Progress Notes (Signed)
PCP - Dr. Cottie Banda Cardiologist - N/A  Chest x-ray - 03/13/19 in epic EKG - 03/13/19 in epic Stress Test - greater than 2 years ECHO - N/A Cardiac Cath - N/A  Sleep Study - N/A CPAP - N/A  Fasting Blood Sugar - 160's Checks Blood Sugar __2-3___ times a day  Blood Thinner Instructions: N/A Aspirin Instructions: N/A Last Dose: N/A  Anesthesia review:  BMI 50.18  Patient denies shortness of breath, fever, cough and chest pain at PAT appointment   Patient verbalized understanding of instructions that were given to them at the PAT appointment. Patient was also instructed that they will need to review over the PAT instructions again at home before surgery.

## 2019-10-10 ENCOUNTER — Telehealth: Payer: Self-pay | Admitting: Pharmacist

## 2019-10-10 ENCOUNTER — Other Ambulatory Visit (HOSPITAL_COMMUNITY): Payer: No Typology Code available for payment source

## 2019-10-10 ENCOUNTER — Encounter (HOSPITAL_COMMUNITY)
Admission: RE | Admit: 2019-10-10 | Discharge: 2019-10-10 | Disposition: A | Discharge status: Home / Self Care | Payer: 59 | Source: Ambulatory Visit | Attending: General Surgery | Admitting: General Surgery

## 2019-10-10 MED ORDER — LANTUS SOLOSTAR 100 UNIT/ML ~~LOC~~ SOPN: 15.0000 [IU] | PEN_INJECTOR | Freq: Every day | SUBCUTANEOUS | 2 refills | Status: DC

## 2019-10-10 MED ORDER — TRUEPLUS PEN NEEDLES 32G X 4 MM MISC: 6 refills | Status: AC

## 2019-10-10 MED FILL — GABAPENTIN 300 MG CAPSULE: 300 | 30 days supply | Qty: 180 | Fill #1

## 2019-10-10 MED FILL — LANTUS SOLOSTAR 100 UNITS/M: 100 | 40 days supply | Qty: 6 | Fill #0

## 2019-10-10 MED FILL — BD PEN NDL SHORT 31GX5/16: 31G X 8 MM | 25 days supply | Qty: 100 | Fill #0

## 2019-10-10 MED FILL — BD PEN NDL SHORT 31GX5/16": 31G X 8 MM | 25 days supply | Qty: 100 | Fill #0

## 2019-10-10 NOTE — Anesthesia Preprocedure Evaluation (Addendum)
Anesthesia Evaluation  Patient identified by MRN, date of birth, ID band Patient awake    Reviewed: Allergy & Precautions, H&P , NPO status , Patient's Chart, lab work & pertinent test results  Airway Mallampati: II   Neck ROM: full    Dental   Pulmonary former smoker,    breath sounds clear to auscultation       Cardiovascular negative cardio ROS   Rhythm:regular Rate:Normal     Neuro/Psych  Headaches, PSYCHIATRIC DISORDERS Anxiety  Neuromuscular disease    GI/Hepatic GERD  ,  Endo/Other  diabetes, Type 2, Insulin DependentMorbid obesity  Renal/GU      Musculoskeletal   Abdominal   Peds  Hematology   Anesthesia Other Findings   Reproductive/Obstetrics                            Anesthesia Physical Anesthesia Plan  ASA: III  Anesthesia Plan: General   Post-op Pain Management:    Induction: Intravenous  PONV Risk Score and Plan: 3 and Ondansetron, Midazolam and Treatment may vary due to age or medical condition  Airway Management Planned: Oral ETT  Additional Equipment:   Intra-op Plan:   Post-operative Plan: Extubation in OR  Informed Consent: I have reviewed the patients History and Physical, chart, labs and discussed the procedure including the risks, benefits and alternatives for the proposed anesthesia with the patient or authorized representative who has indicated his/her understanding and acceptance.       Plan Discussed with: CRNA, Anesthesiologist and Surgeon  Anesthesia Plan Comments: (See PAT note 10/09/19, Jodell Cipro, PA-C)       Anesthesia Quick Evaluation

## 2019-10-10 NOTE — Telephone Encounter (Signed)
I am okay with you contacting patient and having her start Lantus to help with the control of her blood sugars prior to upcoming surgery.

## 2019-10-10 NOTE — Progress Notes (Signed)
Anesthesia Chart Review   Case: [id number] Date/Time: 10/16/19 1045   Procedure: LAPAROSCOPIC CHOLECYSTECTOMY (N/A )   Anesthesia type: General   Pre-op diagnosis: CHRONIC CALCULOUS CHOLECYSTITIS   Location: [place] [facility] / [facility] ORS   Surgeons: Kinsinger, Arta Bruce, MD      DISCUSSION:39 y.o. former smoker (quit 02/21/19) with h/o DM II, GERD, migraines, chronic calculous cholecystitis scheduled for above procedure 10/16/19 with Dr. Gurney Maxin.   Elevated blood glucose of 299 and A1C 10.1 at PAT visit 10/10/19. She reports checking blood sugar at home 2-3 times per day reporting blood sugar runs around 160-190 regularly.  Per pharmacist note, "Call received from patient. She had pre-admission tests done 10/09/19 that showed increase in A1c (10.1). I saw her on 10/08/19 and she reported non-compliance with the 1.5 mg weekly dose of Trulicity with above goal home CBGs. She has picked up the Trulicity, but with her lack of  glycemic control and upcoming surgery, I recommend to start her on 15 units qhs of Lantus. Will forward message to PCP for approval." PCP, Dr. Antony Blackbird, agrees with plan.    Will evaluate DOS.  Dr. Kieth Brightly made aware.  VS: There were no vitals taken for this visit.  PROVIDERSAntony Blackbird, MD is PCP last seen 07/27/2019   LABS: Forwarded to surgeon (all labs ordered are listed, but only abnormal results are displayed)  Labs Reviewed  GLUCOSE, CAPILLARY - Abnormal; Notable for the following components:      Result Value   Glucose-Capillary 299 (*)    All other components within normal limits     IMAGES:   EKG: 03/13/2019 Rate 91 bpm  Sinus rhythm  Borderline right axis deviation  No acute ST/T changes   CV:  Past Medical History:  Diagnosis Date  . Anemia   . Anxiety   . Diabetic neuropathy ([place])   . Gallstones   . GERD (gastroesophageal reflux disease)   . Migraines   . Peripheral neuropathy   . Tachycardia   . Type 2 diabetes mellitus ([place]) 2014     Past Surgical History:  Procedure Laterality Date  . NO PAST SURGERIES      MEDICATIONS: . acetaminophen (TYLENOL) 500 MG tablet  . Blood Glucose Monitoring Suppl (TRUE METRIX METER) w/Device KIT  . cyclobenzaprine (FLEXERIL) 10 MG tablet  . Dulaglutide (TRULICITY) 1.5 BM/8.4XL SOPN  . DULoxetine (CYMBALTA) 60 MG capsule  . famotidine (PEPCID) 20 MG tablet  . gabapentin (NEURONTIN) 300 MG capsule  . glipiZIDE (GLUCOTROL) 10 MG tablet  . glucose blood (TRUE METRIX BLOOD GLUCOSE TEST) test strip  . Insulin Glargine (LANTUS SOLOSTAR) 100 UNIT/ML Solostar Pen  . Insulin Pen Needle (TRUEPLUS PEN NEEDLES) 32G X 4 MM MISC  . metFORMIN (GLUCOPHAGE) 500 MG tablet  . pantoprazole (PROTONIX) 40 MG tablet  . pregabalin (LYRICA) 100 MG capsule  . sertraline (ZOLOFT) 25 MG tablet  . TRUEplus Lancets 28G MISC   No current facility-administered medications for this encounter.   Maia Plan [facility] Pre-Surgical Testing [phone] 10/10/19 y 3:57 PM

## 2019-10-10 NOTE — Telephone Encounter (Signed)
Call placed to patient. She was informed of the approved recommendation to start Lantus. Lantus 15 units qhs sent to her pharmacy. She is comfortable with the operation of the pen.

## 2019-10-10 NOTE — Telephone Encounter (Signed)
Call received from patient. She had pre-admission tests done 10/09/19 that showed increase in A1c (10.1). I saw her on 10/08/19 and she reported non-compliance with the 1.5 mg weekly dose of Trulicity with above goal home CBGs. She has picked up the Trulicity, but with her lack of  glycemic control and upcoming surgery, I recommend to start her on 15 units qhs of Lantus. Will forward message to PCP for approval.

## 2019-10-10 NOTE — Addendum Note (Signed)
Addended by: Lois Huxley, Jeannett Senior L on: 10/10/2019 12:54 PM   Modules accepted: Orders

## 2019-10-12 ENCOUNTER — Other Ambulatory Visit (HOSPITAL_COMMUNITY)
Admission: RE | Admit: 2019-10-12 | Discharge: 2019-10-12 | Disposition: A | Discharge status: Home / Self Care | Payer: 59 | Source: Ambulatory Visit | Attending: General Surgery | Admitting: General Surgery

## 2019-10-12 ENCOUNTER — Other Ambulatory Visit: Payer: Self-pay

## 2019-10-12 DIAGNOSIS — Z20822 Contact with and (suspected) exposure to covid-19: Secondary | ICD-10-CM | POA: Diagnosis not present

## 2019-10-12 DIAGNOSIS — Z01812 Encounter for preprocedural laboratory examination: Secondary | ICD-10-CM | POA: Diagnosis not present

## 2019-10-12 LAB — SARS CORONAVIRUS 2 (TAT 6-24 HRS): SARS Coronavirus 2: NEGATIVE

## 2019-10-15 MED ORDER — DEXTROSE 5 % IV SOLN
3.0000 g | INTRAVENOUS | Status: AC
  Administered 2019-10-16: 13:00:00 3 g via INTRAVENOUS
  Filled 2019-10-15: qty 3

## 2019-10-16 ENCOUNTER — Ambulatory Visit (HOSPITAL_COMMUNITY)
Admission: RE | Admit: 2019-10-16 | Discharge: 2019-10-16 | Disposition: A | Discharge status: Home / Self Care | Payer: 59 | Source: Other Acute Inpatient Hospital | Attending: General Surgery | Admitting: General Surgery

## 2019-10-16 ENCOUNTER — Ambulatory Visit (HOSPITAL_COMMUNITY): Payer: 59 | Admitting: Physician Assistant

## 2019-10-16 ENCOUNTER — Ambulatory Visit (HOSPITAL_COMMUNITY): Payer: 59 | Admitting: Certified Registered"

## 2019-10-16 ENCOUNTER — Encounter (HOSPITAL_COMMUNITY): Payer: Self-pay | Admitting: General Surgery

## 2019-10-16 ENCOUNTER — Encounter (HOSPITAL_COMMUNITY)
Admission: RE | Disposition: A | Discharge status: Home / Self Care | Payer: Self-pay | Source: Other Acute Inpatient Hospital | Attending: General Surgery

## 2019-10-16 DIAGNOSIS — K8012 Calculus of gallbladder with acute and chronic cholecystitis without obstruction: Secondary | ICD-10-CM | POA: Diagnosis not present

## 2019-10-16 DIAGNOSIS — Z6841 Body Mass Index (BMI) 40.0 and over, adult: Secondary | ICD-10-CM | POA: Insufficient documentation

## 2019-10-16 DIAGNOSIS — E1140 Type 2 diabetes mellitus with diabetic neuropathy, unspecified: Secondary | ICD-10-CM | POA: Insufficient documentation

## 2019-10-16 DIAGNOSIS — Z79899 Other long term (current) drug therapy: Secondary | ICD-10-CM | POA: Insufficient documentation

## 2019-10-16 DIAGNOSIS — Z8249 Family history of ischemic heart disease and other diseases of the circulatory system: Secondary | ICD-10-CM | POA: Diagnosis not present

## 2019-10-16 DIAGNOSIS — Z794 Long term (current) use of insulin: Secondary | ICD-10-CM | POA: Diagnosis not present

## 2019-10-16 DIAGNOSIS — Z87891 Personal history of nicotine dependence: Secondary | ICD-10-CM | POA: Diagnosis not present

## 2019-10-16 DIAGNOSIS — Z833 Family history of diabetes mellitus: Secondary | ICD-10-CM | POA: Insufficient documentation

## 2019-10-16 DIAGNOSIS — F419 Anxiety disorder, unspecified: Secondary | ICD-10-CM | POA: Insufficient documentation

## 2019-10-16 DIAGNOSIS — G709 Myoneural disorder, unspecified: Secondary | ICD-10-CM | POA: Insufficient documentation

## 2019-10-16 DIAGNOSIS — K219 Gastro-esophageal reflux disease without esophagitis: Secondary | ICD-10-CM | POA: Diagnosis not present

## 2019-10-16 DIAGNOSIS — K8010 Calculus of gallbladder with chronic cholecystitis without obstruction: Secondary | ICD-10-CM | POA: Diagnosis present

## 2019-10-16 HISTORY — PX: CHOLECYSTECTOMY: SHX55

## 2019-10-16 LAB — GLUCOSE, CAPILLARY
Glucose-Capillary: 198 mg/dL — ABNORMAL HIGH (ref 70–99)
Glucose-Capillary: 160 mg/dL — ABNORMAL HIGH (ref 70–99)

## 2019-10-16 LAB — PREGNANCY, URINE: Preg Test, Ur: NEGATIVE

## 2019-10-16 SURGERY — LAPAROSCOPIC CHOLECYSTECTOMY
Anesthesia: General

## 2019-10-16 MED ORDER — 0.9 % SODIUM CHLORIDE (POUR BTL) OPTIME
TOPICAL | Status: DC | PRN
  Administered 2019-10-16: 14:00:00 1000 mL

## 2019-10-16 MED ORDER — FENTANYL CITRATE (PF) 100 MCG/2ML IJ SOLN
INTRAMUSCULAR | Status: AC
  Filled 2019-10-16: qty 2

## 2019-10-16 MED ORDER — LIDOCAINE 2% (20 MG/ML) 5 ML SYRINGE
INTRAMUSCULAR | Status: DC | PRN
  Administered 2019-10-16: 40 mg via INTRAVENOUS

## 2019-10-16 MED ORDER — OXYCODONE HCL 5 MG PO TABS: 5.0000 mg | ORAL_TABLET | Freq: Four times a day (QID) | ORAL | 0 refills | Status: DC | PRN

## 2019-10-16 MED ORDER — PHENYLEPHRINE 40 MCG/ML (10ML) SYRINGE FOR IV PUSH (FOR BLOOD PRESSURE SUPPORT)
PREFILLED_SYRINGE | INTRAVENOUS | Status: AC
  Filled 2019-10-16: qty 10

## 2019-10-16 MED ORDER — SUCCINYLCHOLINE CHLORIDE 200 MG/10ML IV SOSY
PREFILLED_SYRINGE | INTRAVENOUS | Status: AC
  Filled 2019-10-16: qty 10

## 2019-10-16 MED ORDER — IBUPROFEN 800 MG PO TABS: 800.0000 mg | ORAL_TABLET | Freq: Three times a day (TID) | ORAL | 0 refills | Status: DC | PRN

## 2019-10-16 MED ORDER — PROPOFOL 10 MG/ML IV BOLUS
INTRAVENOUS | Status: DC | PRN
  Administered 2019-10-16: 250 mg via INTRAVENOUS

## 2019-10-16 MED ORDER — BUPIVACAINE HCL (PF) 0.25 % IJ SOLN
INTRAMUSCULAR | Status: DC | PRN
  Administered 2019-10-16: 30 mL

## 2019-10-16 MED ORDER — ACETAMINOPHEN 500 MG PO TABS
1000.0000 mg | ORAL_TABLET | ORAL | Status: AC
  Administered 2019-10-16: 10:00:00 1000 mg via ORAL
  Filled 2019-10-16: qty 2

## 2019-10-16 MED ORDER — SUGAMMADEX SODIUM 500 MG/5ML IV SOLN
INTRAVENOUS | Status: AC
  Filled 2019-10-16: qty 5

## 2019-10-16 MED ORDER — LACTATED RINGERS IV SOLN: INTRAVENOUS | Status: DC

## 2019-10-16 MED ORDER — KETOROLAC TROMETHAMINE 15 MG/ML IJ SOLN
15.0000 mg | INTRAMUSCULAR | Status: AC
  Administered 2019-10-16: 15 mg via INTRAVENOUS
  Filled 2019-10-16 (×2): qty 1

## 2019-10-16 MED ORDER — ESMOLOL HCL 100 MG/10ML IV SOLN
INTRAVENOUS | Status: DC | PRN
  Administered 2019-10-16: 20 mg via INTRAVENOUS

## 2019-10-16 MED ORDER — CHLORHEXIDINE GLUCONATE CLOTH 2 % EX PADS: 6.0000 | MEDICATED_PAD | Freq: Once | CUTANEOUS | Status: DC

## 2019-10-16 MED ORDER — LACTATED RINGERS IV SOLN: INTRAVENOUS | Status: DC | PRN

## 2019-10-16 MED ORDER — LIDOCAINE 2% (20 MG/ML) 5 ML SYRINGE
INTRAMUSCULAR | Status: AC
  Filled 2019-10-16: qty 5

## 2019-10-16 MED ORDER — PROPOFOL 10 MG/ML IV BOLUS
INTRAVENOUS | Status: AC
  Filled 2019-10-16: qty 20

## 2019-10-16 MED ORDER — ALBUTEROL SULFATE HFA 108 (90 BASE) MCG/ACT IN AERS
INHALATION_SPRAY | RESPIRATORY_TRACT | Status: AC
  Filled 2019-10-16: qty 6.7

## 2019-10-16 MED ORDER — FENTANYL CITRATE (PF) 250 MCG/5ML IJ SOLN
INTRAMUSCULAR | Status: DC | PRN
  Administered 2019-10-16 (×3): 50 ug via INTRAVENOUS
  Administered 2019-10-16: 100 ug via INTRAVENOUS

## 2019-10-16 MED ORDER — ROCURONIUM BROMIDE 10 MG/ML (PF) SYRINGE
PREFILLED_SYRINGE | INTRAVENOUS | Status: AC
  Filled 2019-10-16: qty 10

## 2019-10-16 MED ORDER — MIDAZOLAM HCL 2 MG/2ML IJ SOLN
INTRAMUSCULAR | Status: AC
  Filled 2019-10-16: qty 2

## 2019-10-16 MED ORDER — SUGAMMADEX SODIUM 200 MG/2ML IV SOLN
INTRAVENOUS | Status: DC | PRN
  Administered 2019-10-16: 350 mg via INTRAVENOUS

## 2019-10-16 MED ORDER — ONDANSETRON HCL 4 MG/2ML IJ SOLN: 4.0000 mg | Freq: Four times a day (QID) | INTRAMUSCULAR | Status: DC | PRN

## 2019-10-16 MED ORDER — OXYCODONE HCL 5 MG/5ML PO SOLN: 5.0000 mg | Freq: Once | ORAL | Status: DC | PRN

## 2019-10-16 MED ORDER — ESMOLOL HCL 100 MG/10ML IV SOLN
INTRAVENOUS | Status: AC
  Filled 2019-10-16: qty 10

## 2019-10-16 MED ORDER — OXYCODONE HCL 5 MG PO TABS: 5.0000 mg | ORAL_TABLET | Freq: Once | ORAL | Status: DC | PRN

## 2019-10-16 MED ORDER — FENTANYL CITRATE (PF) 100 MCG/2ML IJ SOLN
25.0000 ug | INTRAMUSCULAR | Status: DC | PRN
  Administered 2019-10-16 (×2): 50 ug via INTRAVENOUS

## 2019-10-16 MED ORDER — ONDANSETRON HCL 4 MG/2ML IJ SOLN
INTRAMUSCULAR | Status: DC | PRN
  Administered 2019-10-16: 4 mg via INTRAVENOUS

## 2019-10-16 MED ORDER — ENSURE PRE-SURGERY PO LIQD
296.0000 mL | Freq: Once | ORAL | Status: DC
  Filled 2019-10-16: qty 296

## 2019-10-16 MED ORDER — ROCURONIUM BROMIDE 10 MG/ML (PF) SYRINGE
PREFILLED_SYRINGE | INTRAVENOUS | Status: DC | PRN
  Administered 2019-10-16: 50 mg via INTRAVENOUS
  Administered 2019-10-16: 20 mg via INTRAVENOUS

## 2019-10-16 MED ORDER — DEXAMETHASONE SODIUM PHOSPHATE 10 MG/ML IJ SOLN
INTRAMUSCULAR | Status: AC
  Filled 2019-10-16: qty 1

## 2019-10-16 MED ORDER — DEXAMETHASONE SODIUM PHOSPHATE 10 MG/ML IJ SOLN
INTRAMUSCULAR | Status: DC | PRN
  Administered 2019-10-16: 4 mg via INTRAVENOUS

## 2019-10-16 MED ORDER — SUCCINYLCHOLINE CHLORIDE 200 MG/10ML IV SOSY
PREFILLED_SYRINGE | INTRAVENOUS | Status: DC | PRN
  Administered 2019-10-16: 140 mg via INTRAVENOUS

## 2019-10-16 MED ORDER — LACTATED RINGERS IR SOLN
Status: DC | PRN
  Administered 2019-10-16: 1

## 2019-10-16 MED ORDER — BUPIVACAINE HCL 0.25 % IJ SOLN
INTRAMUSCULAR | Status: AC
  Filled 2019-10-16: qty 1

## 2019-10-16 MED ORDER — ONDANSETRON HCL 4 MG/2ML IJ SOLN
INTRAMUSCULAR | Status: AC
  Filled 2019-10-16: qty 2

## 2019-10-16 MED ORDER — ALBUTEROL SULFATE HFA 108 (90 BASE) MCG/ACT IN AERS
INHALATION_SPRAY | RESPIRATORY_TRACT | Status: DC | PRN
  Administered 2019-10-16 (×2): 2 via RESPIRATORY_TRACT

## 2019-10-16 MED ORDER — PHENYLEPHRINE 40 MCG/ML (10ML) SYRINGE FOR IV PUSH (FOR BLOOD PRESSURE SUPPORT)
PREFILLED_SYRINGE | INTRAVENOUS | Status: DC | PRN
  Administered 2019-10-16: 40 ug via INTRAVENOUS

## 2019-10-16 MED ORDER — MIDAZOLAM HCL 2 MG/2ML IJ SOLN
INTRAMUSCULAR | Status: DC | PRN
  Administered 2019-10-16 (×2): 1 mg via INTRAVENOUS

## 2019-10-16 SURGICAL SUPPLY — 45 items
APPLIER CLIP ROT 10 11.4 M/L (STAPLE)
BENZOIN TINCTURE PRP APPL 2/3 (GAUZE/BANDAGES/DRESSINGS) ×3 IMPLANT
BNDG ADH 1X3 SHEER STRL LF (GAUZE/BANDAGES/DRESSINGS) ×12 IMPLANT
CABLE HIGH FREQUENCY MONO STRZ (ELECTRODE) ×3 IMPLANT
CATH CHOLANG 76X19 KUMAR (CATHETERS) ×1 IMPLANT
CHLORAPREP W/TINT 26 (MISCELLANEOUS) ×5 IMPLANT
CLIP APPLIE ROT 10 11.4 M/L (STAPLE) IMPLANT
CLIP VESOLOCK LG 6/CT PURPLE (CLIP) IMPLANT
CLIP VESOLOCK MED LG 6/CT (CLIP) IMPLANT
CLOSURE WOUND 1/2 X4 (GAUZE/BANDAGES/DRESSINGS) ×1
COVER MAYO STAND STRL (DRAPES) ×1 IMPLANT
COVER SURGICAL LIGHT HANDLE (MISCELLANEOUS) ×3 IMPLANT
COVER WAND RF STERILE (DRAPES) IMPLANT
DECANTER SPIKE VIAL GLASS SM (MISCELLANEOUS) ×3 IMPLANT
DERMABOND ADVANCED (GAUZE/BANDAGES/DRESSINGS)
DERMABOND ADVANCED .7 DNX12 (GAUZE/BANDAGES/DRESSINGS) IMPLANT
DRAIN CHANNEL 19F RND (DRAIN) IMPLANT
DRAPE C-ARM 42X120 X-RAY (DRAPES) IMPLANT
EVACUATOR SILICONE 100CC (DRAIN) IMPLANT
GLOVE BIOGEL PI IND STRL 7.0 (GLOVE) ×1 IMPLANT
GLOVE BIOGEL PI INDICATOR 7.0 (GLOVE) ×2
GLOVE SURG SS PI 7.0 STRL IVOR (GLOVE) ×3 IMPLANT
GOWN STRL REUS W/TWL LRG LVL3 (GOWN DISPOSABLE) ×5 IMPLANT
GOWN STRL REUS W/TWL XL LVL3 (GOWN DISPOSABLE) ×8 IMPLANT
GRASPER SUT TROCAR 14GX15 (MISCELLANEOUS) IMPLANT
KIT BASIN OR (CUSTOM PROCEDURE TRAY) ×3 IMPLANT
KIT TURNOVER KIT A (KITS) IMPLANT
PENCIL SMOKE EVACUATOR (MISCELLANEOUS) IMPLANT
POUCH RETRIEVAL ECOSAC 10 (ENDOMECHANICALS) ×1 IMPLANT
POUCH RETRIEVAL ECOSAC 10MM (ENDOMECHANICALS) ×2
SCISSORS LAP 5X35 DISP (ENDOMECHANICALS) ×3 IMPLANT
SET IRRIG TUBING LAPAROSCOPIC (IRRIGATION / IRRIGATOR) ×3 IMPLANT
SET TUBE SMOKE EVAC HIGH FLOW (TUBING) ×3 IMPLANT
SLEEVE XCEL OPT CAN 5 100 (ENDOMECHANICALS) ×6 IMPLANT
STOPCOCK 4 WAY LG BORE MALE ST (IV SETS) IMPLANT
STRIP CLOSURE SKIN 1/2X4 (GAUZE/BANDAGES/DRESSINGS) ×2 IMPLANT
SUT ETHILON 2 0 PS N (SUTURE) IMPLANT
SUT MNCRL AB 4-0 PS2 18 (SUTURE) ×3 IMPLANT
SUT VICRYL 0 ENDOLOOP (SUTURE) IMPLANT
TAPE STRIPS DRAPE STRL (GAUZE/BANDAGES/DRESSINGS) ×2 IMPLANT
TOWEL OR 17X26 10 PK STRL BLUE (TOWEL DISPOSABLE) ×3 IMPLANT
TOWEL OR NON WOVEN STRL DISP B (DISPOSABLE) ×2 IMPLANT
TRAY LAPAROSCOPIC (CUSTOM PROCEDURE TRAY) ×3 IMPLANT
TROCAR BLADELESS OPT 5 100 (ENDOMECHANICALS) ×3 IMPLANT
TROCAR XCEL NON-BLD 11X100MML (ENDOMECHANICALS) ×3 IMPLANT

## 2019-10-16 NOTE — Op Note (Signed)
PATIENT:  Maria Morris  39 y.o. female  PRE-OPERATIVE DIAGNOSIS:  CHRONIC CALCULOUS CHOLECYSTITIS  POST-OPERATIVE DIAGNOSIS:  CHRONIC CALCULOUS CHOLECYSTITIS  PROCEDURE:  Procedure(s): LAPAROSCOPIC CHOLECYSTECTOMY   SURGEON:  Surgeon(s): Shanira Tine, De Blanch, MD  ASSISTANT: none  ANESTHESIA:   local and general  Indications for procedure: [name] is a 39 y.o. female with symptoms of Abdominal pain consistent with gallbladder disease, Confirmed by Ultrasound.  Description of procedure: The patient was brought into the operative suite, placed supine. Anesthesia was administered with endotracheal tube. Patient was strapped in place and foot board was secured. All pressure points were offloaded by foam padding. The patient was prepped and draped in the usual sterile fashion.  A small incision was made to the right of the umbilicus. A 17mm trocar was inserted into the peritoneal cavity with optical entry. Pneumoperitoneum was applied with high flow low pressure. 2 65mm trocars were placed in the RUQ. A 42mm trocar was placed in the subxiphoid space. Marcaine was infused to the subxiphoid space and lateral upper right abdomen in the transversus abdominis plane. Next the patient was placed in reverse trendelenberg. The livre was quite large. The gallbladder was identified and has adhesive disease of the omentum to the body and infundibulum. This was taken down with cautery and blunt dissection.  The gallbladder was retracted cephalad and lateral. The peritoneum was reflected off the infundibulum working lateral to medial. The cystic duct and cystic artery were identified and further dissection revealed a critical view. The cystic duct and cystic artery were doubly clipped and ligated.   The gallbladder was removed off the liver bed with cautery. The Gallbladder was placed in a specimen bag. The gallbladder fossa was irrigated and hemostasis was applied with cautery. The  gallbladder was removed via the 60mm trocar. No dilation was required for removal, therefore no fascial closure was performed. Pneumoperitoneum was removed, all trocar were removed. All incisions were closed with 4-0 monocryl subcuticular stitch. The patient woke from anesthesia and was brought to PACU in stable condition. All counts were correct  Findings: chronic cholecystitis  Specimen: gallbladder  Blood loss: 20 ml  Local anesthesia: 30 ml marcaine  Complications: none  PLAN OF CARE: Discharge to home after PACU  PATIENT DISPOSITION:  PACU - hemodynamically stable.  Feliciana Rossetti, M.D. General, Bariatric, & Minimally Invasive Surgery [facility]

## 2019-10-16 NOTE — Transfer of Care (Signed)
Immediate Anesthesia Transfer of Care Note  Patient: Maria Morris  Procedure(s) Performed: LAPAROSCOPIC CHOLECYSTECTOMY (N/A )  Patient Location: PACU  Anesthesia Type:General  Level of Consciousness: awake and drowsy  Airway & Oxygen Therapy: Patient Spontanous Breathing and Patient connected to face mask oxygen  Post-op Assessment: Report given to RN and Post -op Vital signs reviewed and stable  Post vital signs: Reviewed and stable  Last Vitals:  Vitals Value Taken Time  BP 150/88 10/16/19 1448  Temp    Pulse 111 10/16/19 1450  Resp 31 10/16/19 1450  SpO2 98 % 10/16/19 1450  Vitals shown include unvalidated device data.  Last Pain:  Vitals:   10/16/19 0940  TempSrc:   PainSc: 5          Complications: No apparent anesthesia complications

## 2019-10-16 NOTE — Anesthesia Procedure Notes (Signed)
Procedure Name: Intubation Date/Time: 10/16/2019 1:21 PM Performed by: Sindy Guadeloupe, CRNA Pre-anesthesia Checklist: Patient identified, Emergency Drugs available, Suction available, Patient being monitored and Timeout performed Patient Re-evaluated:Patient Re-evaluated prior to induction Oxygen Delivery Method: Circle system utilized Preoxygenation: Pre-oxygenation with 100% oxygen Induction Type: IV induction Ventilation: Mask ventilation without difficulty Laryngoscope Size: Glidescope and 4 Grade View: Grade I Tube type: Oral Tube size: 7.0 mm Number of attempts: 1 Airway Equipment and Method: Stylet and Video-laryngoscopy Placement Confirmation: ETT inserted through vocal cords under direct vision,  positive ETCO2 and breath sounds checked- equal and bilateral Secured at: 23 cm Tube secured with: Tape Dental Injury: Teeth and Oropharynx as per pre-operative assessment

## 2019-10-16 NOTE — H&P (Signed)
Maria Morris is an 39 y.o. female.   Chief Complaint: abdominal pain HPI: 39 yo female with epigastric pain and work up consistent with gallstones. She continues to have attacks despite dietary changes.  Past Medical History:  Diagnosis Date  . Anemia   . Anxiety   . Diabetic neuropathy ([place])   . Gallstones   . GERD (gastroesophageal reflux disease)   . Migraines   . Peripheral neuropathy   . Tachycardia   . Type 2 diabetes mellitus ([place]) 2014    Past Surgical History:  Procedure Laterality Date  . NO PAST SURGERIES      Family History  Problem Relation Age of Onset  . Diabetes Mother   . Diabetes Father   . Hypertension Father   . Heart disease Father   . Breast cancer Paternal Aunt   . Cancer Neg Hx    Social History:  reports that she quit smoking about 7 months ago. She has never used smokeless tobacco. She reports current alcohol use. She reports that she does not use drugs.  Allergies: No Known Allergies  Medications Prior to Admission  Medication Sig Dispense Refill  . cyclobenzaprine (FLEXERIL) 10 MG tablet Take 1 tablet (10 mg total) by mouth 2 (two) times daily as needed for muscle spasms. 60 tablet 1  . Dulaglutide (TRULICITY) 1.5 AS/3.4HD SOPN Inject 1.5 mg into the skin once a week. 2 mL 2  . DULoxetine (CYMBALTA) 60 MG capsule Take 1 capsule (60 mg total) by mouth daily. 30 capsule 3  . gabapentin (NEURONTIN) 300 MG capsule Take 2 capsules (600 mg total) by mouth 3 (three) times daily. 180 capsule 4  . glipiZIDE (GLUCOTROL) 10 MG tablet Take 1 tablet (10 mg total) by mouth 2 (two) times daily before a meal. 60 tablet 3  . metFORMIN (GLUCOPHAGE) 500 MG tablet Take 1 tablet (500 mg total) by mouth 2 (two) times daily with a meal. 60 tablet 2  . sertraline (ZOLOFT) 25 MG tablet TAKE 1 TABLET (25 MG TOTAL) BY MOUTH DAILY. 30 tablet 1  . acetaminophen (TYLENOL) 500 MG tablet Take 1,000-2,000 mg by mouth 3 (three) times daily as needed for headache  (migraine).    . Blood Glucose Monitoring Suppl (TRUE METRIX METER) w/Device KIT 1 each by Does not apply route 2 (two) times a day. 1 kit 0  . famotidine (PEPCID) 20 MG tablet Take 1 tablet (20 mg total) by mouth 2 (two) times daily. (Patient not taking: Reported on 10/05/2019) 30 tablet 1  . glucose blood (TRUE METRIX BLOOD GLUCOSE TEST) test strip Use as instructed 100 each 12  . Insulin Glargine (LANTUS SOLOSTAR) 100 UNIT/ML Solostar Pen Inject 15 Units into the skin daily. 15 mL 2  . Insulin Pen Needle (TRUEPLUS PEN NEEDLES) 32G X 4 MM MISC Use as instructed. 100 each 6  . pantoprazole (PROTONIX) 40 MG tablet Take 1 tablet (40 mg total) by mouth daily. 30 tablet 2  . pregabalin (LYRICA) 100 MG capsule Take 1 capsule (100 mg total) by mouth 2 (two) times daily. (Patient not taking: Reported on 10/05/2019) 180 capsule 1  . TRUEplus Lancets 28G MISC 1 each by Does not apply route 2 (two) times a day. 100 each 1    Results for orders placed or performed during the hospital encounter of 10/16/19 (from the past 48 hour(s))  Glucose, capillary     Status: Abnormal   Collection Time: 10/16/19  9:19 AM  Result Value Ref Range  Glucose-Capillary 198 (H) 70 - 99 mg/dL    Comment: Glucose reference range applies only to samples taken after fasting for at least 8 hours.  Pregnancy, urine     Status: None   Collection Time: 10/16/19  9:47 AM  Result Value Ref Range   Preg Test, Ur NEGATIVE NEGATIVE    Comment:        THE SENSITIVITY OF THIS METHODOLOGY IS >20 mIU/mL. Performed at [facility], [place] [address] [place], [place] [address]    No results found.  Review of Systems  Gastrointestinal: Positive for abdominal pain and nausea.  All other systems reviewed and are negative.   Blood pressure (!) 145/89, pulse (!) 103, temperature 98.7 F (37.1 C), temperature source Oral, resp. rate 18, SpO2 100 %. Physical Exam  Vitals reviewed. Constitutional: She is oriented to  person, place, and time. She appears well-developed and well-nourished.  HENT:  Head: Normocephalic and atraumatic.  Eyes: Pupils are equal, round, and reactive to light. Conjunctivae and EOM are normal.  Cardiovascular: Normal rate and regular rhythm.  Respiratory: Effort normal and breath sounds normal.  GI: Soft. Bowel sounds are normal. She exhibits no distension. There is no abdominal tenderness.  Musculoskeletal:        General: Normal range of motion.     Cervical back: Normal range of motion and neck supple.  Neurological: She is alert and oriented to person, place, and time.  Skin: Skin is warm and dry.  Psychiatric: She has a normal mood and affect. Her behavior is normal.     Assessment/Plan 39 yo female with chronic calculous cholecystitis -lap chole -ERAS protocol -planned outpatient procedure  Mickeal Skinner, MD 10/16/2019, 12:41 PM

## 2019-10-17 ENCOUNTER — Encounter: Payer: Self-pay | Admitting: *Deleted

## 2019-10-18 LAB — SURGICAL PATHOLOGY

## 2019-10-19 NOTE — Anesthesia Postprocedure Evaluation (Signed)
Anesthesia Post Note  Patient: Maria Morris  Procedure(s) Performed: LAPAROSCOPIC CHOLECYSTECTOMY (N/A )     Patient location during evaluation: PACU Anesthesia Type: General Level of consciousness: awake and alert Pain management: pain level controlled Vital Signs Assessment: post-procedure vital signs reviewed and stable Respiratory status: spontaneous breathing, nonlabored ventilation, respiratory function stable and patient connected to nasal cannula oxygen Cardiovascular status: blood pressure returned to baseline and stable Postop Assessment: no apparent nausea or vomiting Anesthetic complications: no    Last Vitals:  Vitals:   10/16/19 1730 10/16/19 1800  BP: (!) 125/91 133/87  Pulse: (!) 103 100  Resp: 20 20  Temp: 36.8 C 36.8 C  SpO2: 93% 94%    Last Pain:  Vitals:   10/16/19 1800  TempSrc:   PainSc: 4    Pain Goal:                   Jameia Makris S

## 2019-10-23 ENCOUNTER — Ambulatory Visit: Payer: 59 | Admitting: Pharmacist

## 2019-10-31 ENCOUNTER — Other Ambulatory Visit: Payer: Self-pay | Admitting: Family Medicine

## 2019-10-31 ENCOUNTER — Telehealth: Payer: Self-pay | Admitting: Pharmacist

## 2019-10-31 DIAGNOSIS — F411 Generalized anxiety disorder: Secondary | ICD-10-CM

## 2019-10-31 DIAGNOSIS — M791 Myalgia, unspecified site: Secondary | ICD-10-CM

## 2019-10-31 MED ORDER — LANTUS SOLOSTAR 100 UNIT/ML ~~LOC~~ SOPN: 50.0000 [IU] | PEN_INJECTOR | Freq: Every day | SUBCUTANEOUS | 2 refills | Status: DC

## 2019-10-31 MED FILL — PANTOPRAZOLE SOD DR 40 MG T: 40 | 30 days supply | Qty: 30 | Fill #0

## 2019-10-31 MED FILL — glipiZIDE 10 MG TABS: 10 | 30 days supply | Qty: 60 | Fill #3

## 2019-10-31 MED FILL — SERTRALINE HCL 25 MG TABS: 25 | 30 days supply | Qty: 30 | Fill #0

## 2019-10-31 MED FILL — metFORMIN HCL 500 MG TABS: 500 | 30 days supply | Qty: 60 | Fill #0

## 2019-10-31 MED FILL — CYCLOBENZAPRINE 10 MG TAB: 10 | 30 days supply | Qty: 60 | Fill #0

## 2019-10-31 MED FILL — DULoxetine HCL 60 MG CPEP: 60 | 30 days supply | Qty: 30 | Fill #3

## 2019-10-31 MED FILL — LANTUS SOLOSTAR 100 UNITS/M: 100 | 30 days supply | Qty: 15 | Fill #0

## 2019-10-31 NOTE — Telephone Encounter (Signed)
Patient call received this morning. I sent over a rx with Dr. Debroah Baller approval on 10/10/19 to start 15 units of Lantus daily to aid in glycemic control for surgical clearance. Today, pt calls to reveal that she has uptitrated her insulin to 50 units daily. She denies hypoglycemia and reports that her blood sugars have been controlled since making the increase. She reports that most of her blood sugars at home are in the 100 range. Will send updated rx to our pharmacy.

## 2019-11-07 MED FILL — GABAPENTIN 300 MG CAPSULE: 300 | 30 days supply | Qty: 180 | Fill #2

## 2019-11-14 DIAGNOSIS — N912 Amenorrhea, unspecified: Secondary | ICD-10-CM | POA: Insufficient documentation

## 2019-11-14 DIAGNOSIS — M2550 Pain in unspecified joint: Secondary | ICD-10-CM | POA: Insufficient documentation

## 2019-11-14 DIAGNOSIS — R002 Palpitations: Secondary | ICD-10-CM | POA: Insufficient documentation

## 2019-11-14 DIAGNOSIS — F331 Major depressive disorder, recurrent, moderate: Secondary | ICD-10-CM | POA: Insufficient documentation

## 2019-11-14 DIAGNOSIS — Z6841 Body Mass Index (BMI) 40.0 and over, adult: Secondary | ICD-10-CM | POA: Insufficient documentation

## 2019-11-14 DIAGNOSIS — F411 Generalized anxiety disorder: Secondary | ICD-10-CM | POA: Insufficient documentation

## 2019-11-27 DIAGNOSIS — I152 Hypertension secondary to endocrine disorders: Secondary | ICD-10-CM | POA: Insufficient documentation

## 2019-12-26 DIAGNOSIS — L568 Other specified acute skin changes due to ultraviolet radiation: Secondary | ICD-10-CM | POA: Insufficient documentation

## 2020-01-29 DIAGNOSIS — R7982 Elevated C-reactive protein (CRP): Secondary | ICD-10-CM | POA: Insufficient documentation

## 2020-01-29 DIAGNOSIS — R700 Elevated erythrocyte sedimentation rate: Secondary | ICD-10-CM | POA: Insufficient documentation

## 2020-01-29 DIAGNOSIS — M791 Myalgia, unspecified site: Secondary | ICD-10-CM | POA: Insufficient documentation

## 2020-01-29 DIAGNOSIS — K219 Gastro-esophageal reflux disease without esophagitis: Secondary | ICD-10-CM | POA: Insufficient documentation

## 2020-01-29 DIAGNOSIS — J301 Allergic rhinitis due to pollen: Secondary | ICD-10-CM | POA: Insufficient documentation

## 2020-03-04 ENCOUNTER — Other Ambulatory Visit: Payer: Self-pay

## 2020-03-04 ENCOUNTER — Encounter (HOSPITAL_COMMUNITY): Payer: Self-pay

## 2020-03-04 ENCOUNTER — Emergency Department (HOSPITAL_COMMUNITY): Payer: 59

## 2020-03-04 ENCOUNTER — Emergency Department (HOSPITAL_COMMUNITY)
Admission: EM | Admit: 2020-03-04 | Discharge: 2020-03-04 | Disposition: A | Discharge status: Home / Self Care | Payer: 59 | Source: Home / Self Care

## 2020-03-04 DIAGNOSIS — Z5321 Procedure and treatment not carried out due to patient leaving prior to being seen by health care provider: Secondary | ICD-10-CM | POA: Insufficient documentation

## 2020-03-04 DIAGNOSIS — L97529 Non-pressure chronic ulcer of other part of left foot with unspecified severity: Secondary | ICD-10-CM | POA: Insufficient documentation

## 2020-03-04 LAB — COMPREHENSIVE METABOLIC PANEL
ALT: 13 U/L (ref 0–44)
AST: 16 U/L (ref 15–41)
Albumin: 3 g/dL — ABNORMAL LOW (ref 3.5–5.0)
Alkaline Phosphatase: 72 U/L (ref 38–126)
Anion gap: 11 (ref 5–15)
BUN: 28 mg/dL — ABNORMAL HIGH (ref 6–20)
CO2: 22 mmol/L (ref 22–32)
Calcium: 8.7 mg/dL — ABNORMAL LOW (ref 8.9–10.3)
Chloride: 103 mmol/L (ref 98–111)
Creatinine, Ser: 1.75 mg/dL — ABNORMAL HIGH (ref 0.44–1.00)
GFR calc Af Amer: 42 mL/min — ABNORMAL LOW (ref >60)
GFR calc non Af Amer: 37 mL/min — ABNORMAL LOW (ref >60)
Glucose, Bld: 160 mg/dL — ABNORMAL HIGH (ref 70–99)
Potassium: 5.2 mmol/L — ABNORMAL HIGH (ref 3.5–5.1)
Sodium: 136 mmol/L (ref 135–145)
Total Bilirubin: 0.4 mg/dL (ref 0.3–1.2)
Total Protein: 6.6 g/dL (ref 6.5–8.1)

## 2020-03-04 LAB — CBC WITH DIFFERENTIAL/PLATELET
Abs Immature Granulocytes: 0.05 10*3/uL (ref 0.00–0.07)
Basophils Absolute: 0.1 10*3/uL (ref 0.0–0.1)
Basophils Relative: 1 %
Eosinophils Absolute: 0.6 10*3/uL — ABNORMAL HIGH (ref 0.0–0.5)
Eosinophils Relative: 6 %
HCT: 30.4 % — ABNORMAL LOW (ref 36.0–46.0)
Hemoglobin: 9.7 g/dL — ABNORMAL LOW (ref 12.0–15.0)
Immature Granulocytes: 1 %
Lymphocytes Relative: 23 %
Lymphs Abs: 2.5 10*3/uL (ref 0.7–4.0)
MCH: 27.5 pg (ref 26.0–34.0)
MCHC: 31.9 g/dL (ref 30.0–36.0)
MCV: 86.1 fL (ref 80.0–100.0)
Monocytes Absolute: 0.8 10*3/uL (ref 0.1–1.0)
Monocytes Relative: 8 %
Neutro Abs: 6.7 10*3/uL (ref 1.7–7.7)
Neutrophils Relative %: 61 %
Platelets: 337 10*3/uL (ref 150–400)
RBC: 3.53 MIL/uL — ABNORMAL LOW (ref 3.87–5.11)
RDW: 15.6 % — ABNORMAL HIGH (ref 11.5–15.5)
WBC: 10.8 10*3/uL — ABNORMAL HIGH (ref 4.0–10.5)
nRBC: 0 % (ref 0.0–0.2)

## 2020-03-04 LAB — LACTIC ACID, PLASMA: Lactic Acid, Venous: 2 mmol/L (ref 0.5–1.9)

## 2020-03-04 LAB — I-STAT BETA HCG BLOOD, ED (MC, WL, AP ONLY): I-stat hCG, quantitative: 6.8 m[IU]/mL — ABNORMAL HIGH (ref <5)

## 2020-03-04 IMAGING — DX DG TOE 5TH 2+V*L*
3 series · 3 of 3 positions shown · non-contrast
Comparison: None.

CLINICAL DATA: Soft tissue ulcer rule out osteomyelitis.

EXAM:
DG TOE 5TH LEFT

[toe ap]
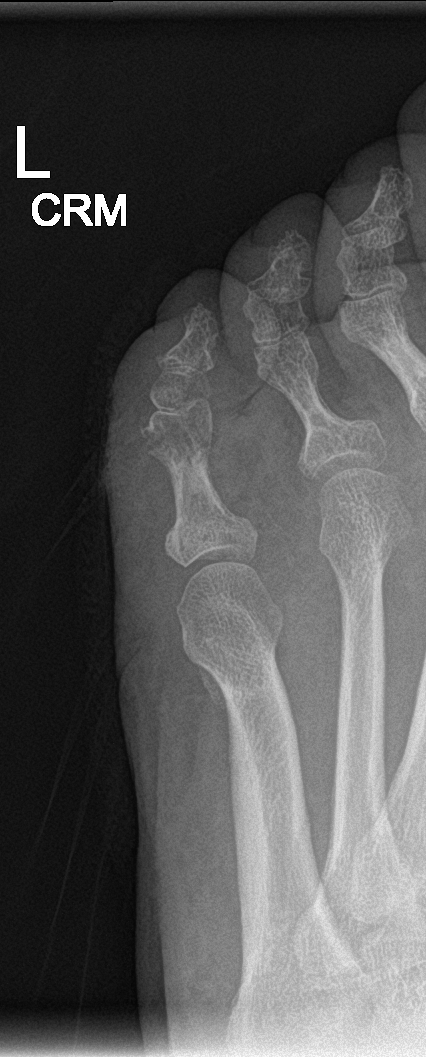

[toe obl]
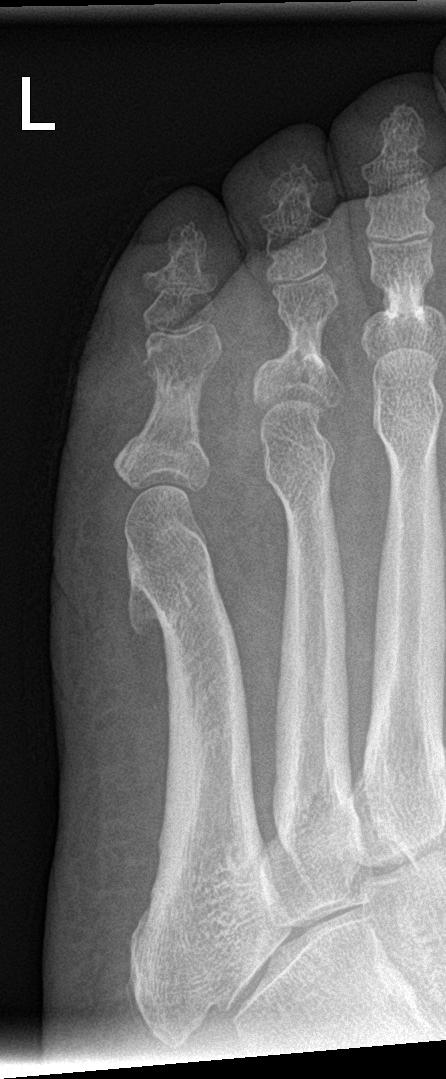

[toe lat]
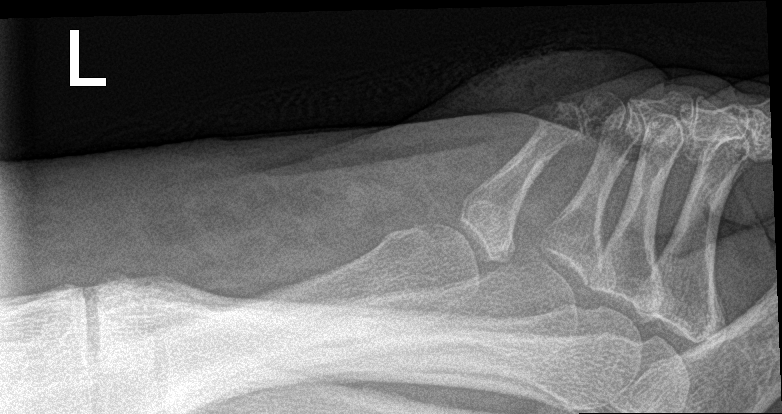

[3 of 3 positions shown; findings below may reference images not displayed]

FINDINGS: Negative for fracture. No cortical erosion or osteomyelitis. No
significant degenerative change

Soft tissue swelling and wound noted.
IMPRESSION: Negative for fracture or osteomyelitis.

## 2020-03-04 MED ORDER — SODIUM CHLORIDE 0.9% FLUSH: 3.0000 mL | Freq: Once | INTRAVENOUS | Status: DC

## 2020-03-04 NOTE — ED Notes (Signed)
Pt called for vitals, no response x1 

## 2020-03-04 NOTE — ED Notes (Signed)
Pt called multiple times with no answer.

## 2020-03-04 NOTE — ED Triage Notes (Signed)
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 Would has foul odor and puss like drainage coming from it.

## 2020-03-04 NOTE — ED Notes (Signed)
Pt left the ED when attempted to get a room for her. Pt called to her house and encouraged to come back ASAP due to concern and possible blood infection and low BP,  Pt states she will return.

## 2020-03-04 NOTE — ED Notes (Signed)
Pt called for a second time to come to the ED and she refuses to return. ED charge nurse to be notified.

## 2020-03-05 ENCOUNTER — Telehealth: Payer: Self-pay | Admitting: Sports Medicine

## 2020-03-05 ENCOUNTER — Emergency Department (HOSPITAL_COMMUNITY): Payer: 59

## 2020-03-05 ENCOUNTER — Encounter (HOSPITAL_COMMUNITY): Payer: Self-pay | Admitting: Emergency Medicine

## 2020-03-05 ENCOUNTER — Inpatient Hospital Stay (HOSPITAL_COMMUNITY)
Admission: EM | Admit: 2020-03-05 | Discharge: 2020-03-08 | DRG: 854 | Disposition: A | Discharge status: Home / Self Care | Payer: 59 | Attending: Internal Medicine | Admitting: Internal Medicine

## 2020-03-05 DIAGNOSIS — E1169 Type 2 diabetes mellitus with other specified complication: Secondary | ICD-10-CM | POA: Diagnosis present

## 2020-03-05 DIAGNOSIS — R6520 Severe sepsis without septic shock: Secondary | ICD-10-CM | POA: Diagnosis present

## 2020-03-05 DIAGNOSIS — E1165 Type 2 diabetes mellitus with hyperglycemia: Secondary | ICD-10-CM | POA: Diagnosis present

## 2020-03-05 DIAGNOSIS — M86679 Other chronic osteomyelitis, unspecified ankle and foot: Secondary | ICD-10-CM | POA: Diagnosis not present

## 2020-03-05 DIAGNOSIS — Z87891 Personal history of nicotine dependence: Secondary | ICD-10-CM | POA: Diagnosis not present

## 2020-03-05 DIAGNOSIS — E872 Acidosis: Secondary | ICD-10-CM | POA: Diagnosis present

## 2020-03-05 DIAGNOSIS — Z833 Family history of diabetes mellitus: Secondary | ICD-10-CM | POA: Diagnosis not present

## 2020-03-05 DIAGNOSIS — Z79899 Other long term (current) drug therapy: Secondary | ICD-10-CM

## 2020-03-05 DIAGNOSIS — K219 Gastro-esophageal reflux disease without esophagitis: Secondary | ICD-10-CM | POA: Diagnosis present

## 2020-03-05 DIAGNOSIS — N179 Acute kidney failure, unspecified: Secondary | ICD-10-CM | POA: Diagnosis present

## 2020-03-05 DIAGNOSIS — A419 Sepsis, unspecified organism: Secondary | ICD-10-CM | POA: Diagnosis present

## 2020-03-05 DIAGNOSIS — Z803 Family history of malignant neoplasm of breast: Secondary | ICD-10-CM

## 2020-03-05 DIAGNOSIS — E119 Type 2 diabetes mellitus without complications: Secondary | ICD-10-CM

## 2020-03-05 DIAGNOSIS — Z794 Long term (current) use of insulin: Secondary | ICD-10-CM | POA: Diagnosis not present

## 2020-03-05 DIAGNOSIS — N171 Acute kidney failure with acute cortical necrosis: Secondary | ICD-10-CM | POA: Diagnosis not present

## 2020-03-05 DIAGNOSIS — F329 Major depressive disorder, single episode, unspecified: Secondary | ICD-10-CM | POA: Diagnosis present

## 2020-03-05 DIAGNOSIS — M869 Osteomyelitis, unspecified: Secondary | ICD-10-CM | POA: Diagnosis present

## 2020-03-05 DIAGNOSIS — Z791 Long term (current) use of non-steroidal anti-inflammatories (NSAID): Secondary | ICD-10-CM

## 2020-03-05 DIAGNOSIS — I10 Essential (primary) hypertension: Secondary | ICD-10-CM | POA: Diagnosis present

## 2020-03-05 DIAGNOSIS — Z20822 Contact with and (suspected) exposure to covid-19: Secondary | ICD-10-CM | POA: Diagnosis present

## 2020-03-05 DIAGNOSIS — E1140 Type 2 diabetes mellitus with diabetic neuropathy, unspecified: Secondary | ICD-10-CM | POA: Diagnosis present

## 2020-03-05 DIAGNOSIS — M86172 Other acute osteomyelitis, left ankle and foot: Secondary | ICD-10-CM | POA: Diagnosis present

## 2020-03-05 DIAGNOSIS — F419 Anxiety disorder, unspecified: Secondary | ICD-10-CM | POA: Diagnosis present

## 2020-03-05 DIAGNOSIS — Z8249 Family history of ischemic heart disease and other diseases of the circulatory system: Secondary | ICD-10-CM | POA: Diagnosis not present

## 2020-03-05 HISTORY — DX: Essential (primary) hypertension: I10

## 2020-03-05 LAB — COMPREHENSIVE METABOLIC PANEL
ALT: 15 U/L (ref 0–44)
AST: 17 U/L (ref 15–41)
Albumin: 3 g/dL — ABNORMAL LOW (ref 3.5–5.0)
Alkaline Phosphatase: 80 U/L (ref 38–126)
Anion gap: 11 (ref 5–15)
BUN: 23 mg/dL — ABNORMAL HIGH (ref 6–20)
CO2: 23 mmol/L (ref 22–32)
Calcium: 9 mg/dL (ref 8.9–10.3)
Chloride: 105 mmol/L (ref 98–111)
Creatinine, Ser: 1.4 mg/dL — ABNORMAL HIGH (ref 0.44–1.00)
GFR calc Af Amer: 55 mL/min — ABNORMAL LOW (ref >60)
GFR calc non Af Amer: 48 mL/min — ABNORMAL LOW (ref >60)
Glucose, Bld: 119 mg/dL — ABNORMAL HIGH (ref 70–99)
Potassium: 5.1 mmol/L (ref 3.5–5.1)
Sodium: 139 mmol/L (ref 135–145)
Total Bilirubin: 0.4 mg/dL (ref 0.3–1.2)
Total Protein: 6.8 g/dL (ref 6.5–8.1)

## 2020-03-05 LAB — CBG MONITORING, ED
Glucose-Capillary: 170 mg/dL — ABNORMAL HIGH (ref 70–99)
Glucose-Capillary: 121 mg/dL — ABNORMAL HIGH (ref 70–99)

## 2020-03-05 LAB — CBC WITH DIFFERENTIAL/PLATELET
Abs Immature Granulocytes: 0.04 10*3/uL (ref 0.00–0.07)
Basophils Absolute: 0.1 10*3/uL (ref 0.0–0.1)
Basophils Relative: 1 %
Eosinophils Absolute: 0.8 10*3/uL — ABNORMAL HIGH (ref 0.0–0.5)
Eosinophils Relative: 8 %
HCT: 32.7 % — ABNORMAL LOW (ref 36.0–46.0)
Hemoglobin: 9.8 g/dL — ABNORMAL LOW (ref 12.0–15.0)
Immature Granulocytes: 0 %
Lymphocytes Relative: 26 %
Lymphs Abs: 2.7 10*3/uL (ref 0.7–4.0)
MCH: 26.6 pg (ref 26.0–34.0)
MCHC: 30 g/dL (ref 30.0–36.0)
MCV: 88.9 fL (ref 80.0–100.0)
Monocytes Absolute: 0.8 10*3/uL (ref 0.1–1.0)
Monocytes Relative: 8 %
Neutro Abs: 6 10*3/uL (ref 1.7–7.7)
Neutrophils Relative %: 57 %
Platelets: 342 10*3/uL (ref 150–400)
RBC: 3.68 MIL/uL — ABNORMAL LOW (ref 3.87–5.11)
RDW: 15.8 % — ABNORMAL HIGH (ref 11.5–15.5)
WBC: 10.3 10*3/uL (ref 4.0–10.5)
nRBC: 0 % (ref 0.0–0.2)

## 2020-03-05 LAB — I-STAT BETA HCG BLOOD, ED (MC, WL, AP ONLY): I-stat hCG, quantitative: <5 m[IU]/mL (ref <5)

## 2020-03-05 LAB — HEMOGLOBIN A1C
Hgb A1c MFr Bld: 7.3 % — ABNORMAL HIGH (ref 4.8–5.6)
Mean Plasma Glucose: 162.81 mg/dL

## 2020-03-05 LAB — SARS CORONAVIRUS 2 BY RT PCR (HOSPITAL ORDER, PERFORMED IN ~~LOC~~ HOSPITAL LAB): SARS Coronavirus 2: NEGATIVE

## 2020-03-05 LAB — LACTIC ACID, PLASMA
Lactic Acid, Venous: 2.2 mmol/L (ref 0.5–1.9)
Lactic Acid, Venous: 1.9 mmol/L (ref 0.5–1.9)

## 2020-03-05 IMAGING — DX DG FOOT COMPLETE 3+V*L*
3 series · 3 of 3 positions shown · non-contrast
Comparison: Left fifth toe x-rays from yesterday.

CLINICAL DATA: Fifth toe infection.

EXAM:
LEFT FOOT - COMPLETE 3+ VIEW

[foot ap]
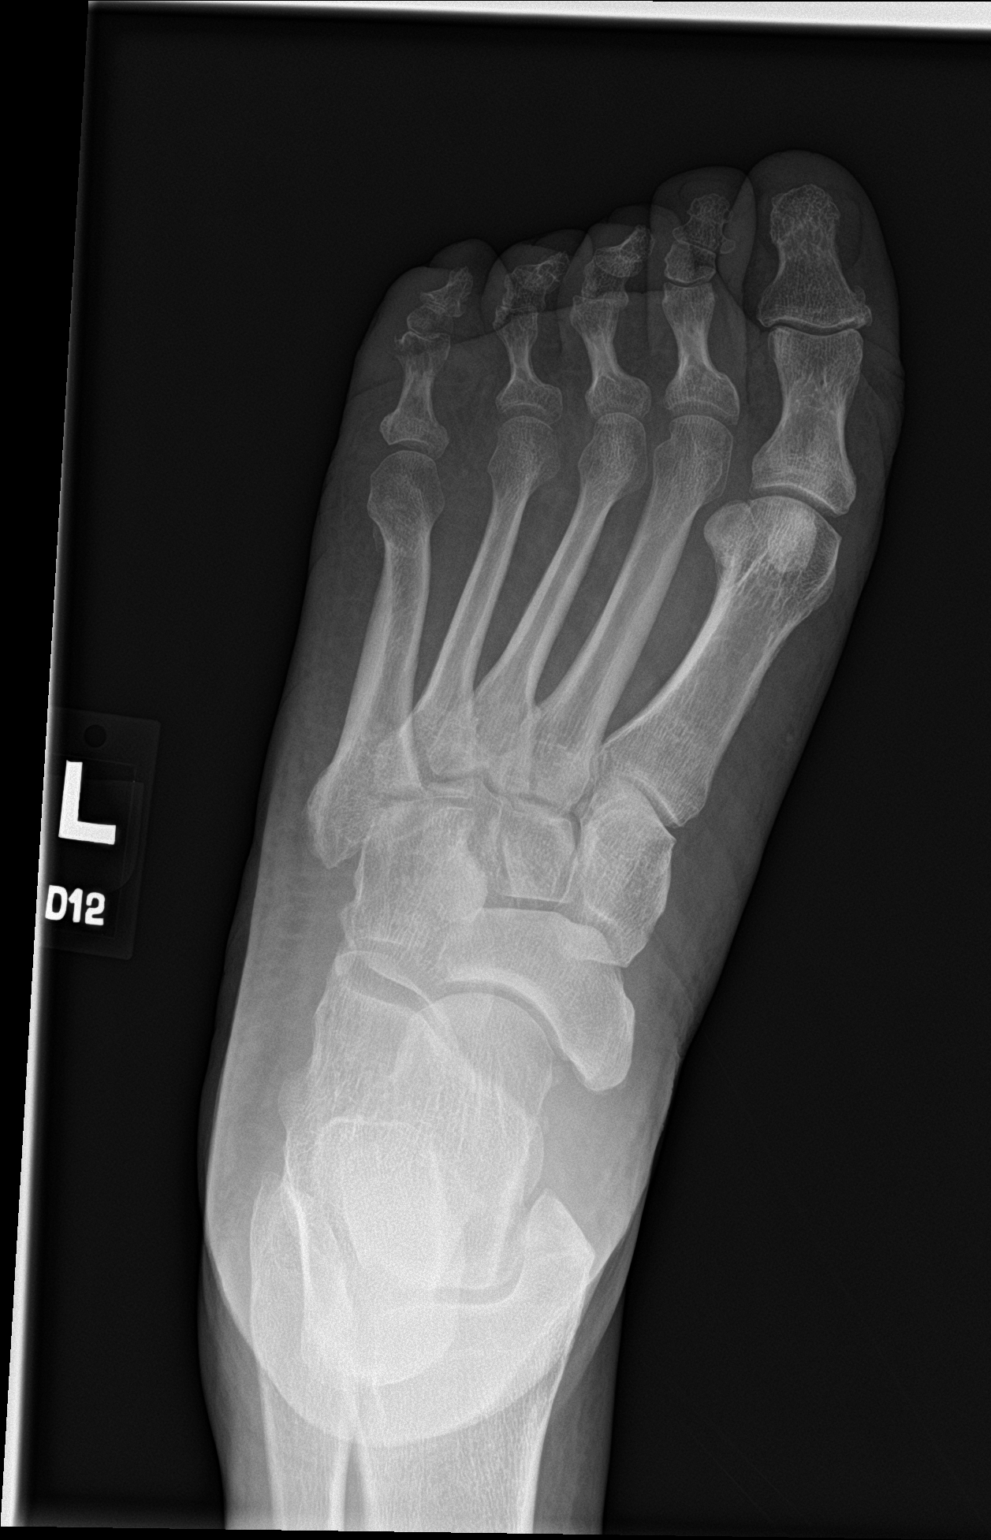

[foot obl]
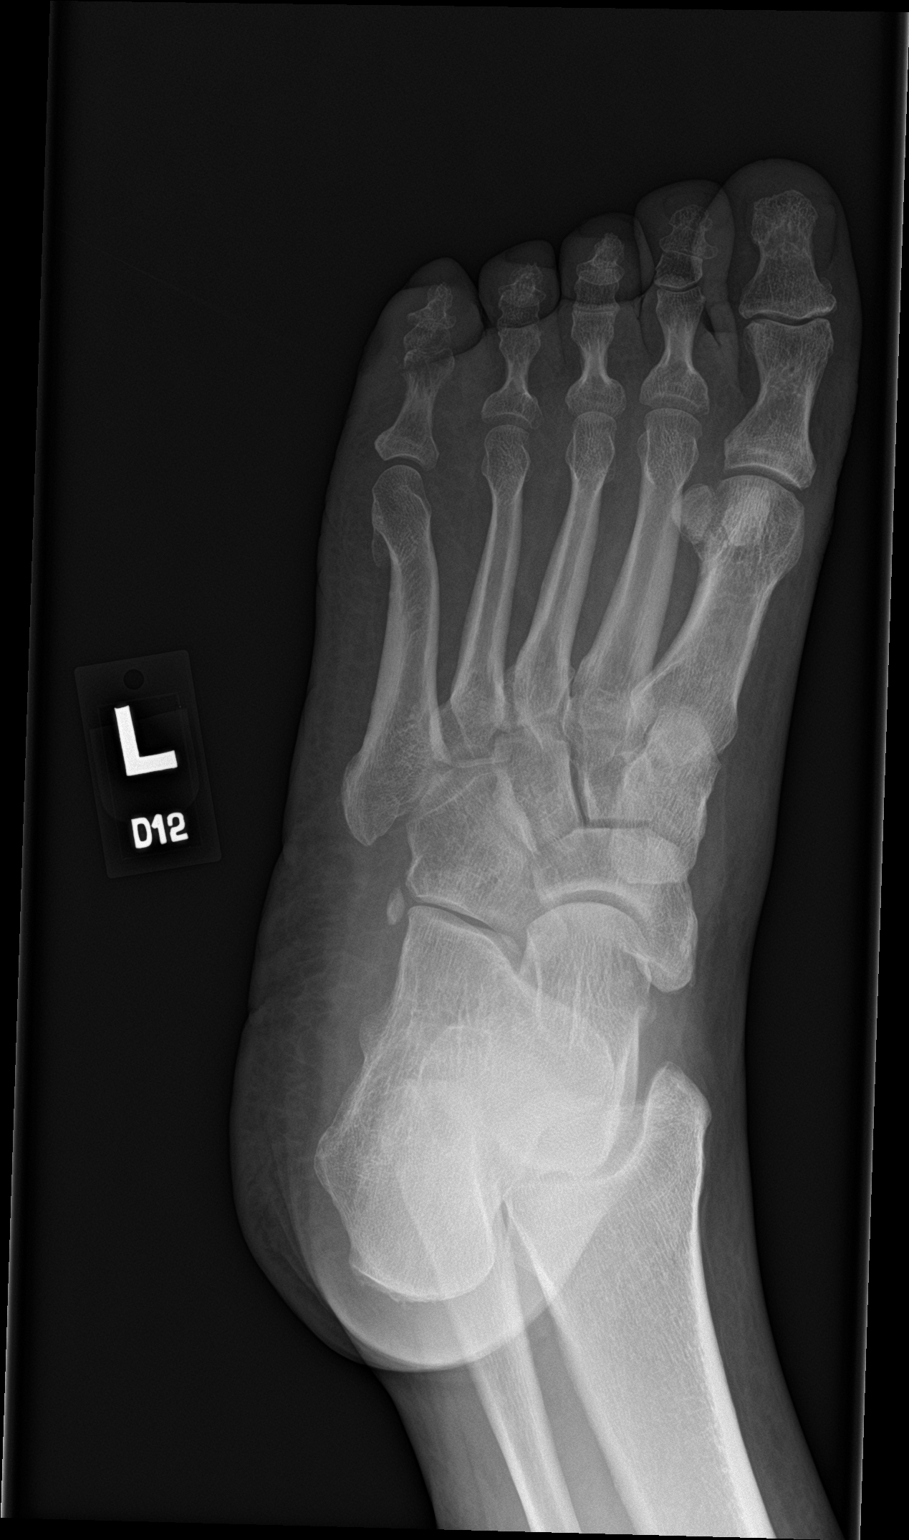

[foot lat]
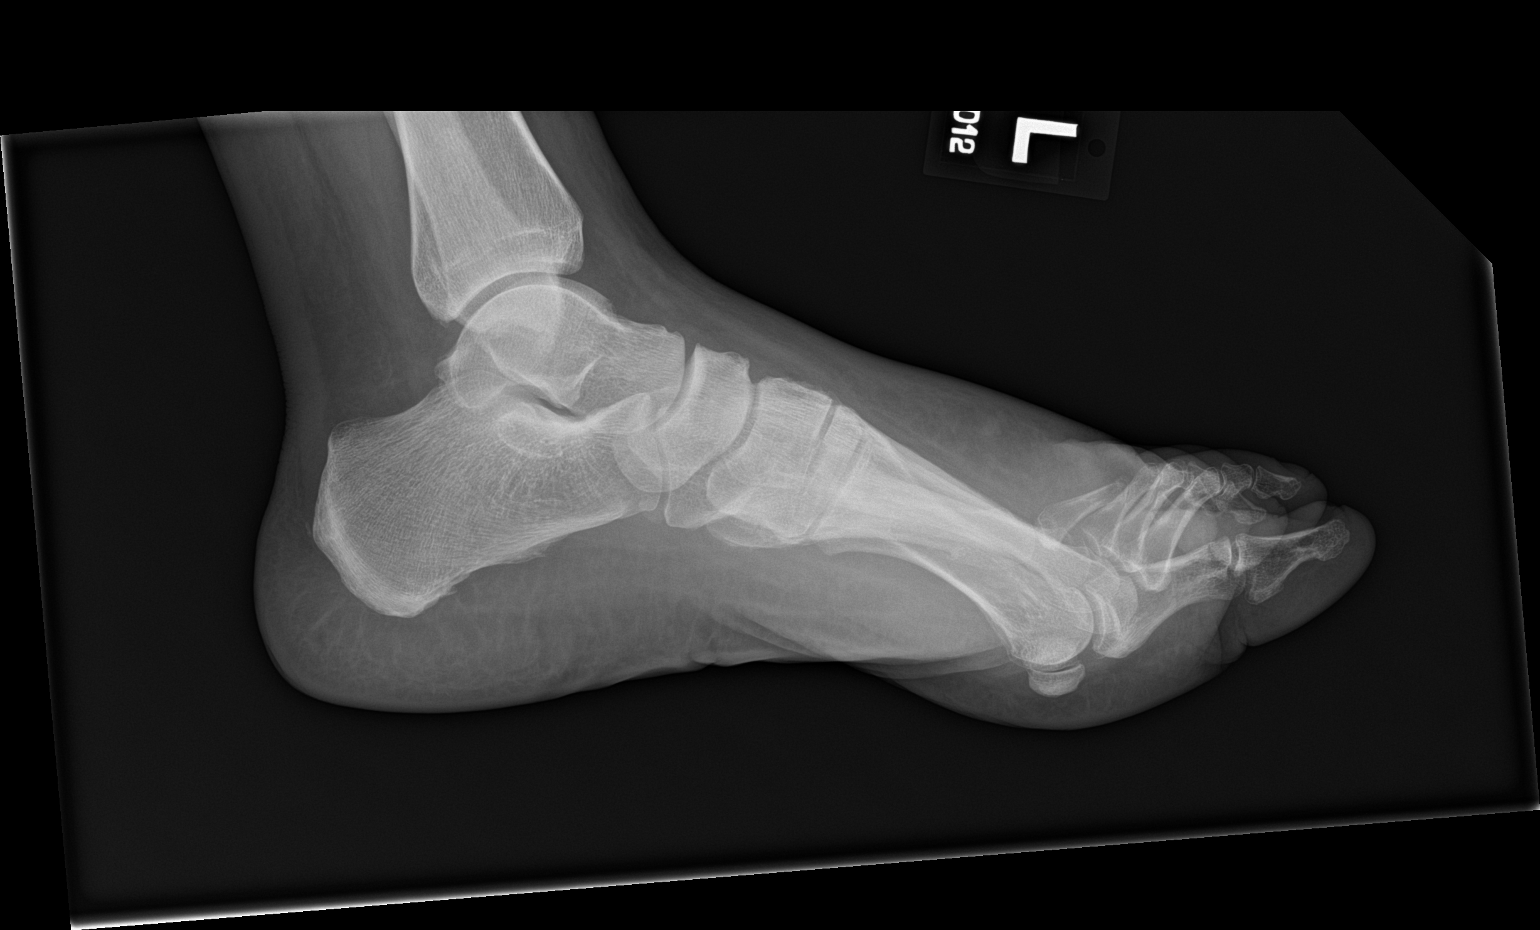

[3 of 3 positions shown; findings below may reference images not displayed]

FINDINGS: Unchanged cortical irregularity along the lateral head of the fifth
proximal phalanx. No acute fracture or dislocation. Joint spaces are
preserved. Bone mineralization is normal. Soft tissue swelling of
the forefoot and fifth toe.
IMPRESSION: 1. Unchanged cortical irregularity along the lateral head of the
fifth proximal phalanx, suspicious for osteomyelitis.

## 2020-03-05 MED ORDER — SODIUM CHLORIDE 0.9 % IV SOLN: INTRAVENOUS | Status: AC

## 2020-03-05 MED ORDER — INSULIN GLARGINE 100 UNIT/ML ~~LOC~~ SOLN
25.0000 [IU] | Freq: Every day | SUBCUTANEOUS | Status: DC
  Administered 2020-03-05 – 2020-03-07 (×3): 25 [IU] via SUBCUTANEOUS
  Filled 2020-03-05 (×4): qty 0.25

## 2020-03-05 MED ORDER — VANCOMYCIN HCL IN DEXTROSE 1-5 GM/200ML-% IV SOLN: 1000.0000 mg | Freq: Once | INTRAVENOUS | Status: DC

## 2020-03-05 MED ORDER — DULOXETINE HCL 60 MG PO CPEP
60.0000 mg | ORAL_CAPSULE | Freq: Every day | ORAL | Status: DC
  Administered 2020-03-05 – 2020-03-07 (×3): 60 mg via ORAL
  Filled 2020-03-05 (×3): qty 1

## 2020-03-05 MED ORDER — VITAMIN D (ERGOCALCIFEROL) 1.25 MG (50000 UNIT) PO CAPS
50000.0000 [IU] | ORAL_CAPSULE | ORAL | Status: DC
  Filled 2020-03-05: qty 1

## 2020-03-05 MED ORDER — SODIUM CHLORIDE 0.9 % IV SOLN
2.0000 g | Freq: Once | INTRAVENOUS | Status: AC
  Administered 2020-03-05: 2 g via INTRAVENOUS
  Filled 2020-03-05: qty 20

## 2020-03-05 MED ORDER — FLUTICASONE PROPIONATE 50 MCG/ACT NA SUSP
1.0000 | Freq: Every day | NASAL | Status: DC | PRN
  Filled 2020-03-05: qty 16

## 2020-03-05 MED ORDER — GABAPENTIN 300 MG PO CAPS
300.0000 mg | ORAL_CAPSULE | Freq: Three times a day (TID) | ORAL | Status: DC
  Administered 2020-03-05 – 2020-03-07 (×8): 300 mg via ORAL
  Filled 2020-03-05 (×7): qty 1

## 2020-03-05 MED ORDER — SERTRALINE HCL 25 MG PO TABS: 25.0000 mg | ORAL_TABLET | Freq: Every day | ORAL | Status: DC

## 2020-03-05 MED ORDER — BUPROPION HCL ER (XL) 150 MG PO TB24
150.0000 mg | ORAL_TABLET | Freq: Every day | ORAL | Status: DC
  Administered 2020-03-06 – 2020-03-07 (×2): 150 mg via ORAL
  Filled 2020-03-05 (×2): qty 1

## 2020-03-05 MED ORDER — INSULIN ASPART 100 UNIT/ML ~~LOC~~ SOLN
0.0000 [IU] | Freq: Three times a day (TID) | SUBCUTANEOUS | Status: DC
  Administered 2020-03-06 (×2): 2 [IU] via SUBCUTANEOUS
  Administered 2020-03-07: 1 [IU] via SUBCUTANEOUS

## 2020-03-05 MED ORDER — OXYCODONE HCL 5 MG PO TABS
5.0000 mg | ORAL_TABLET | Freq: Four times a day (QID) | ORAL | Status: DC | PRN
  Administered 2020-03-06 – 2020-03-07 (×2): 5 mg via ORAL
  Filled 2020-03-05 (×2): qty 1

## 2020-03-05 MED ORDER — PIPERACILLIN-TAZOBACTAM 3.375 G IVPB 30 MIN
3.3750 g | Freq: Once | INTRAVENOUS | Status: AC
  Administered 2020-03-05: 3.375 g via INTRAVENOUS
  Filled 2020-03-05: qty 50

## 2020-03-05 MED ORDER — PIPERACILLIN-TAZOBACTAM 3.375 G IVPB 30 MIN: 3.3750 g | Freq: Once | INTRAVENOUS | Status: DC

## 2020-03-05 MED ORDER — ACETAMINOPHEN 325 MG PO TABS
650.0000 mg | ORAL_TABLET | Freq: Three times a day (TID) | ORAL | Status: DC | PRN
  Administered 2020-03-06 – 2020-03-07 (×2): 650 mg via ORAL
  Filled 2020-03-05 (×3): qty 2

## 2020-03-05 MED ORDER — VANCOMYCIN HCL 10 G IV SOLR
2000.0000 mg | Freq: Once | INTRAVENOUS | Status: AC
  Administered 2020-03-05: 2000 mg via INTRAVENOUS
  Filled 2020-03-05: qty 2000

## 2020-03-05 MED ORDER — FAMOTIDINE 20 MG PO TABS: 20.0000 mg | ORAL_TABLET | Freq: Two times a day (BID) | ORAL | Status: DC

## 2020-03-05 MED ORDER — FERROUS GLUCONATE 324 (38 FE) MG PO TABS
324.0000 mg | ORAL_TABLET | Freq: Every day | ORAL | Status: DC
  Administered 2020-03-06 – 2020-03-07 (×2): 324 mg via ORAL
  Filled 2020-03-05 (×3): qty 1

## 2020-03-05 MED ORDER — PANTOPRAZOLE SODIUM 40 MG PO TBEC
40.0000 mg | DELAYED_RELEASE_TABLET | Freq: Every day | ORAL | Status: DC
  Administered 2020-03-05 – 2020-03-07 (×3): 40 mg via ORAL
  Filled 2020-03-05 (×3): qty 1

## 2020-03-05 MED ORDER — IBUPROFEN 200 MG PO TABS
800.0000 mg | ORAL_TABLET | Freq: Three times a day (TID) | ORAL | Status: DC | PRN
  Administered 2020-03-06 – 2020-03-07 (×2): 800 mg via ORAL
  Filled 2020-03-05 (×3): qty 4

## 2020-03-05 MED ORDER — CYCLOBENZAPRINE HCL 10 MG PO TABS: 10.0000 mg | ORAL_TABLET | Freq: Two times a day (BID) | ORAL | Status: DC | PRN

## 2020-03-05 MED ORDER — VANCOMYCIN HCL 1.25 G IV SOLR
1250.0000 mg | INTRAVENOUS | Status: DC
  Administered 2020-03-06: 1250 mg via INTRAVENOUS
  Filled 2020-03-05: qty 1250

## 2020-03-05 MED ORDER — HEPARIN SODIUM (PORCINE) 5000 UNIT/ML IJ SOLN
5000.0000 [IU] | Freq: Three times a day (TID) | INTRAMUSCULAR | Status: DC
  Administered 2020-03-05 – 2020-03-08 (×9): 5000 [IU] via SUBCUTANEOUS
  Filled 2020-03-05 (×9): qty 1

## 2020-03-05 MED ORDER — GABAPENTIN 300 MG PO CAPS: 600.0000 mg | ORAL_CAPSULE | Freq: Three times a day (TID) | ORAL | Status: DC

## 2020-03-05 MED ORDER — PIPERACILLIN-TAZOBACTAM 3.375 G IVPB
3.3750 g | Freq: Three times a day (TID) | INTRAVENOUS | Status: DC
  Administered 2020-03-05 – 2020-03-08 (×8): 3.375 g via INTRAVENOUS
  Filled 2020-03-05 (×8): qty 50

## 2020-03-05 MED ORDER — GABAPENTIN 300 MG PO CAPS
300.0000 mg | ORAL_CAPSULE | Freq: Three times a day (TID) | ORAL | Status: DC
  Filled 2020-03-05: qty 1

## 2020-03-05 MED ORDER — TOPIRAMATE 25 MG PO TABS
25.0000 mg | ORAL_TABLET | Freq: Every day | ORAL | Status: DC
  Administered 2020-03-05 – 2020-03-07 (×3): 25 mg via ORAL
  Filled 2020-03-05 (×3): qty 1

## 2020-03-05 MED ORDER — SODIUM CHLORIDE 0.9 % IV BOLUS
1000.0000 mL | Freq: Once | INTRAVENOUS | Status: AC
  Administered 2020-03-05: 1000 mL via INTRAVENOUS

## 2020-03-05 NOTE — Plan of Care (Signed)
  Problem: Education: Goal: Knowledge of General Education information will improve Description: Including pain rating scale, medication(s)/side effects and non-pharmacologic comfort measures Outcome: Progressing   Problem: Pain Managment: Goal: General experience of comfort will improve Outcome: Progressing   

## 2020-03-05 NOTE — ED Notes (Signed)
First attempt to call report unsuccessful, no answer.

## 2020-03-05 NOTE — ED Provider Notes (Signed)
Emergency Department Provider Note   I have reviewed the triage vital signs and the nursing notes.   HISTORY  Chief Complaint Blood Infection   HPI Maria Morris is a 39 y.o. female with past medical history of diabetic neuropathy along with insulin-dependent diabetes presents to the emergency department with left little toe redness, drainage, foul odor.  Patient tells me that symptoms initially developed in April of this year.  She developed a corn on the left little toe that her husband trimmed down.  She states that around that time she noticed some difficulty with that wound healing.  The little toe has become more swollen and an area has opened on top of the little toe which is not the initial area of where the corn was treated.  The area has begun to drain and is foul-smelling.  She came to the emergency department yesterday but ultimately left without being seen with a wait was very Lateka Rady.  She did have blood work drawn at that time which showed elevated lactic acid.  The patient was called back and advised to return to the emergency department immediately which she did this AM. Denies fever. The area is intermittently painful but pain is not severe. No other wounds seen by patient.      Past Medical History:  Diagnosis Date  . Anemia   . Anxiety   . Diabetic neuropathy (HCC)   . Gallstones   . GERD (gastroesophageal reflux disease)   . Hypertension   . Migraines   . Peripheral neuropathy   . Tachycardia   . Type 2 diabetes mellitus (HCC) 2014    Patient Active Problem List   Diagnosis Date Noted  . Sepsis (HCC) 03/07/2020  . AKI (acute kidney injury) (HCC) 03/07/2020  . DM II (diabetes mellitus, type II), controlled (HCC) 03/06/2020  . Osteomyelitis (HCC) 03/05/2020  . Well woman exam with routine gynecological exam 07/26/2019  . Breast pain, left 07/26/2019    Past Surgical History:  Procedure Laterality Date  . CHOLECYSTECTOMY N/A 10/16/2019   Procedure:  LAPAROSCOPIC CHOLECYSTECTOMY;  Surgeon: Kinsinger, De Blanch, MD;  Location: [facility];  Service: General;  Laterality: N/A;  . NO PAST SURGERIES      Allergies Patient has no known allergies.  Family History  Problem Relation Age of Onset  . Diabetes Mother   . Diabetes Father   . Hypertension Father   . Heart disease Father   . Breast cancer Paternal Aunt   . Cancer Neg Hx     Social History Social History   Tobacco Use  . Smoking status: Former Smoker    Quit date: 02/2019    Years since quitting: 1.0  . Smokeless tobacco: Never Used  Vaping Use  . Vaping Use: Never used  Substance Use Topics  . Alcohol use: Yes    Comment: occ  . Drug use: No    Review of Systems  Constitutional: No fever/chills Eyes: No visual changes. ENT: No sore throat. Cardiovascular: Denies chest pain. Respiratory: Denies shortness of breath. Gastrointestinal: No abdominal pain.  No nausea, no vomiting.  No diarrhea.  No constipation. Genitourinary: Negative for dysuria. Musculoskeletal: Negative for back pain. Left foot pain.  Skin: Rash, drainage, and swelling of the left little toe.  Neurological: Negative for headaches, focal weakness or numbness.  10-point ROS otherwise negative.  ____________________________________________   PHYSICAL EXAM:  VITAL SIGNS: ED Triage Vitals [03/05/20 0912]  Enc Vitals Group     BP (!) 129/55  Pulse Rate (!) 102     Resp 20     Temp 98.4 F (36.9 C)     Temp Source Oral     SpO2 100 %     Weight 227 lb (103 kg)     Height  (1.702 m)   Constitutional: Alert and oriented. Well appearing and in no acute distress. Eyes: Conjunctivae are normal.  Head: Atraumatic. Nose: No congestion/rhinnorhea. Mouth/Throat: Mucous membranes are moist.  Neck: No stridor.  Cardiovascular: Normal rate, regular rhythm. Good peripheral circulation. Grossly normal heart sounds.   Respiratory: Normal respiratory effort.  No retractions. Lungs  CTAB. Gastrointestinal: Soft and nontender. No distention.  Musculoskeletal: Swelling over the left foot with wound and purulent drainage over the top of the left little toe.  Some skin breakdown medially in the 4/5 toe web space.  No deep ulceration or visible bone.  Neurologic:  Normal speech and language. No gross focal neurologic deficits are appreciated.  Skin:  Skin is warm and dry. Ulceration noted as below with purulent drainage. Mild erythema extending to the dorsum of the left foot as pictured.       ____________________________________________   LABS (all labs ordered are listed, but only abnormal results are displayed)  Labs Reviewed  LACTIC ACID, PLASMA - Abnormal; Notable for the following components:      Result Value   Lactic Acid, Venous 2.2 (*)    All other components within normal limits  COMPREHENSIVE METABOLIC PANEL - Abnormal; Notable for the following components:   Glucose, Bld 119 (*)    BUN 23 (*)    Creatinine, Ser 1.40 (*)    Albumin 3.0 (*)    GFR calc non Af Amer 48 (*)    GFR calc Af Amer 55 (*)    All other components within normal limits  CBC WITH DIFFERENTIAL/PLATELET - Abnormal; Notable for the following components:   RBC 3.68 (*)    Hemoglobin 9.8 (*)    HCT 32.7 (*)    RDW 15.8 (*)    Eosinophils Absolute 0.8 (*)    All other components within normal limits  HEMOGLOBIN A1C - Abnormal; Notable for the following components:   Hgb A1c MFr Bld 7.3 (*)    All other components within normal limits  BASIC METABOLIC PANEL - Abnormal; Notable for the following components:   Potassium 5.4 (*)    Glucose, Bld 185 (*)    Creatinine, Ser 1.59 (*)    Calcium 8.0 (*)    GFR calc non Af Amer 41 (*)    GFR calc Af Amer 48 (*)    All other components within normal limits  CBC - Abnormal; Notable for the following components:   RBC 3.25 (*)    Hemoglobin 8.9 (*)    HCT 28.6 (*)    RDW 15.9 (*)    All other components within normal limits  GLUCOSE,  CAPILLARY - Abnormal; Notable for the following components:   Glucose-Capillary 175 (*)    All other components within normal limits  GLUCOSE, CAPILLARY - Abnormal; Notable for the following components:   Glucose-Capillary 110 (*)    All other components within normal limits  GLUCOSE, CAPILLARY - Abnormal; Notable for the following components:   Glucose-Capillary 157 (*)    All other components within normal limits  GLUCOSE, CAPILLARY - Abnormal; Notable for the following components:   Glucose-Capillary 115 (*)    All other components within normal limits  BASIC METABOLIC PANEL - Abnormal;  Notable for the following components:   CO2 21 (*)    Glucose, Bld 128 (*)    Creatinine, Ser 1.27 (*)    Calcium 8.3 (*)    GFR calc non Af Amer 54 (*)    All other components within normal limits  GLUCOSE, CAPILLARY - Abnormal; Notable for the following components:   Glucose-Capillary 113 (*)    All other components within normal limits  GLUCOSE, CAPILLARY - Abnormal; Notable for the following components:   Glucose-Capillary 145 (*)    All other components within normal limits  GLUCOSE, CAPILLARY - Abnormal; Notable for the following components:   Glucose-Capillary 151 (*)    All other components within normal limits  GLUCOSE, CAPILLARY - Abnormal; Notable for the following components:   Glucose-Capillary 149 (*)    All other components within normal limits  GLUCOSE, CAPILLARY - Abnormal; Notable for the following components:   Glucose-Capillary 123 (*)    All other components within normal limits  GLUCOSE, CAPILLARY - Abnormal; Notable for the following components:   Glucose-Capillary 120 (*)    All other components within normal limits  GLUCOSE, CAPILLARY - Abnormal; Notable for the following components:   Glucose-Capillary 131 (*)    All other components within normal limits  CBG MONITORING, ED - Abnormal; Notable for the following components:   Glucose-Capillary 170 (*)    All other  components within normal limits  CBG MONITORING, ED - Abnormal; Notable for the following components:   Glucose-Capillary 121 (*)    All other components within normal limits  CULTURE, BLOOD (ROUTINE X 2)  CULTURE, BLOOD (ROUTINE X 2)  SARS CORONAVIRUS 2 BY RT PCR (HOSPITAL ORDER, PERFORMED IN [facility])  MRSA PCR SCREENING  LACTIC ACID, PLASMA  HIV ANTIBODY (ROUTINE TESTING W REFLEX)  I-STAT BETA HCG BLOOD, ED ([facility], [facility], [facility] ONLY)  SURGICAL PATHOLOGY   ____________________________________________  RADIOLOGY  DG Toe 5th Left  Result Date: 03/04/2020 CLINICAL DATA:  Soft tissue ulcer rule out osteomyelitis. EXAM: DG TOE 5TH LEFT COMPARISON:  None. FINDINGS: Negative for fracture. No cortical erosion or osteomyelitis. No significant degenerative change Soft tissue swelling and wound noted. IMPRESSION: Negative for fracture or osteomyelitis. Electronically Signed   By: Marlan Palau M.D.   On: 03/04/2020 13:07    ____________________________________________   PROCEDURES  Procedure(s) performed:   Procedures  CRITICAL CARE Performed by: Maia Plan Total critical care time: 35 minutes Critical care time was exclusive of separately billable procedures and treating other patients. Critical care was necessary to treat or prevent imminent or life-threatening deterioration. Critical care was time spent personally by me on the following activities: development of treatment plan with patient and/or surrogate as well as nursing, discussions with consultants, evaluation of patient's response to treatment, examination of patient, obtaining history from patient or surrogate, ordering and performing treatments and interventions, ordering and review of laboratory studies, ordering and review of radiographic studies, pulse oximetry and re-evaluation of patient's condition.  Alona Bene, MD Emergency Medicine  ____________________________________________   INITIAL IMPRESSION /  ASSESSMENT AND PLAN / ED COURSE  Pertinent labs & imaging results that were available during my care of the patient were reviewed by me and considered in my medical decision making (see chart for details).   Patient presents emergency department with left toe wound worsening over the past several weeks to months.  Patient has good DP and PT pulses on exam.  There is an ulceration of the top of the  fifth toe with purulent drainage labs reviewed from yesterday showing leukocytosis along with elevated lactic acid.  Repeat lactic acid this morning is slightly higher at 2.2.  I will repeat labs from yesterday.  Patient does not appear acutely ill but did have mild tachycardia on arrival which is resolved without intervention here.  She is afebrile.  Blood pressures on my exam are slightly low but patient not significantly symptomatic.  Will cover with broad-spectrum antibiotics and start IV fluids with concern for early sepsis with source being the toe.  Will obtain plain films of the toe to evaluate for underlying osteomyelitis and send Covid swab although patient is vaccinated and w/o symptoms.   Discussed patient's case with [facility] to request admission. Patient and family (if present) updated with plan. Care transferred to [facility] service.  I reviewed all nursing notes, vitals, pertinent old records, EKGs, labs, imaging (as available).   ____________________________________________  FINAL CLINICAL IMPRESSION(S) / ED DIAGNOSES  Final diagnoses:  Osteomyelitis of left foot, unspecified type (HCC)     MEDICATIONS GIVEN DURING THIS VISIT:  Medications  acetaminophen (TYLENOL) tablet 650 mg ( Oral MAR Unhold 03/08/20 1207)  ibuprofen (ADVIL) tablet 800 mg ( Oral MAR Unhold 03/08/20 1207)  oxyCODONE (Oxy IR/ROXICODONE) immediate release tablet 5 mg ( Oral MAR Unhold 03/08/20 1207)  buPROPion (WELLBUTRIN XL) 24 hr tablet 150 mg ( Oral MAR Unhold 03/08/20 1207)  DULoxetine (CYMBALTA) DR capsule 60 mg ( Oral  MAR Unhold 03/08/20 1207)  insulin glargine (LANTUS) injection 25 Units ( Subcutaneous MAR Unhold 03/08/20 1207)  pantoprazole (PROTONIX) EC tablet 40 mg ( Oral MAR Unhold 03/08/20 1207)  ferrous gluconate (FERGON) tablet 324 mg ( Oral MAR Unhold 03/08/20 1207)  topiramate (TOPAMAX) tablet 25 mg ( Oral MAR Unhold 03/08/20 1207)  Vitamin D (Ergocalciferol) (DRISDOL) capsule 50,000 Units ( Oral MAR Unhold 03/08/20 1207)  fluticasone (FLONASE) 50 MCG/ACT nasal spray 1 spray ( Each Nare MAR Unhold 03/08/20 1207)  0.9 %  sodium chloride infusion (0 mLs Intravenous Stopped 03/06/20 1714)  heparin injection 5,000 Units ( Subcutaneous MAR Unhold 03/08/20 1207)  insulin aspart (novoLOG) injection 0-9 Units ( Subcutaneous MAR Unhold 03/08/20 1207)  piperacillin-tazobactam (ZOSYN) IVPB 3.375 g (0 g Intravenous Stopped 03/05/20 1748)    Followed by  piperacillin-tazobactam (ZOSYN) IVPB 3.375 g ( Intravenous MAR Unhold 03/08/20 1207)  gabapentin (NEURONTIN) capsule 300 mg ( Oral MAR Unhold 03/08/20 1207)  vancomycin (VANCOREADY) IVPB 1750 mg/350 mL ( Intravenous MAR Unhold 03/08/20 1207)  cefTRIAXone (ROCEPHIN) 2 g in sodium chloride 0.9 % 100 mL IVPB (0 g Intravenous Stopped 03/05/20 1441)  sodium chloride 0.9 % bolus 1,000 mL (0 mLs Intravenous Stopped 03/05/20 1600)  vancomycin (VANCOCIN) 2,000 mg in sodium chloride 0.9 % 500 mL IVPB (0 mg Intravenous Stopped 03/05/20 1712)  chlorhexidine (PERIDEX) 0.12 % solution 15 mL (15 mLs Mouth/Throat Given 03/08/20 1024)     Note:  This document was prepared using Dragon voice recognition software and may include unintentional dictation errors.  Alona Bene, MD, [facility] Emergency Medicine     Laithan Conchas, Arlyss Repress, MD 03/08/20 2033

## 2020-03-05 NOTE — H&P (Signed)
History and Physical    Maria Morris UVO:[id number] DOB: [birth year] DOA: 03/05/2020  PCP: Antony Blackbird, MD (Confirm with patient/family/NH records and if not entered, this has to be entered at [facility] point of entry) Patient coming from: Home  I have personally briefly reviewed patient's old medical records in [place]  Chief Complaint: Left foot wound  HPI: Maria Morris is a 39 y.o. female with medical history significant of IDDM, diabetic neuropathy, HTN, anxiety depression, presented with worsening of left foot infection.  Patient first developed a left toe cellulitis in April this year.  She went to see her PCP, and x-ray was done showed soft tissue infection, and patient was placed on 7 days course of doxycycline.  Swelling decreased but the wound was healed.  Patient went back to PCP but 1 week ago with worsening of swelling and recurrent discharge from the left fifth toe wound.  PCP referred her to wound clinic and podiatrist but she has not been able to go either yet.  She denies any significant pain, no fever or chills.  Patient to ED yesterday for similar symptoms, blood work showed elevated lactic acid level.  However patient signed AMA, today patient was called from home for abnormal lab/lactic acidosis.  Denies any new symptoms since yesterday. ED Course: X-ray showed osteomyelitis on the left fifth metatarsal head.  WBC 10.8, creatinine 1.7  Review of Systems: As per HPI otherwise 10 point review of systems negative.    Past Medical History:  Diagnosis Date   Anemia    Anxiety    Diabetic neuropathy (HCC)    Gallstones    GERD (gastroesophageal reflux disease)    Migraines    Peripheral neuropathy    Tachycardia    Type 2 diabetes mellitus ([place]) 2014    Past Surgical History:  Procedure Laterality Date   CHOLECYSTECTOMY N/A 10/16/2019   Procedure: LAPAROSCOPIC CHOLECYSTECTOMY;  Surgeon: Kinsinger, Arta Bruce, MD;  Location: [facility];  Service:  General;  Laterality: N/A;   NO PAST SURGERIES       reports that she quit smoking about a year ago. She has never used smokeless tobacco. She reports current alcohol use. She reports that she does not use drugs.  No Known Allergies  Family History  Problem Relation Age of Onset   Diabetes Mother    Diabetes Father    Hypertension Father    Heart disease Father    Breast cancer Paternal Aunt    Cancer Neg Hx      Prior to Admission medications   Medication Sig Start Date End Date Taking? Authorizing Provider  acetaminophen (TYLENOL) 500 MG tablet Take 1,000-2,000 mg by mouth 3 (three) times daily as needed for headache (migraine).   Yes [provider]  Blood Glucose Monitoring Suppl (TRUE METRIX METER) w/Device KIT 1 each by Does not apply route 2 (two) times a day. 03/22/19  Yes McClung, Angela M, PA-C  buPROPion (WELLBUTRIN XL) 150 MG 24 hr tablet Take 150 mg by mouth daily.  02/25/20  Yes [provider]  cyclobenzaprine (FLEXERIL) 10 MG tablet TAKE 1 TABLET (10 MG TOTAL) BY MOUTH 2 (TWO) TIMES DAILY AS NEEDED FOR MUSCLE SPASMS. Patient not taking: Reported on 03/05/2020 10/31/19   Fulp, Cammie, MD  Dulaglutide (TRULICITY) 1.5 VQ/2.5ZD SOPN Inject 1.5 mg into the skin once a week. 09/10/19  Yes Fulp, Cammie, MD  DULoxetine (CYMBALTA) 60 MG capsule Take 1 capsule (60 mg total) by mouth daily. 07/27/19  Yes Fulp, Cammie, MD  ferrous gluconate (FERGON) 324 MG tablet Take 324 mg by mouth daily with breakfast.   Yes [provider]  fluticasone (FLONASE) 50 MCG/ACT nasal spray Place 1 spray into both nostrils daily as needed for allergies. 02/21/20  Yes [provider]  gabapentin (NEURONTIN) 300 MG capsule Take 2 capsules (600 mg total) by mouth 3 (three) times daily. Patient taking differently: Take 800 mg by mouth 3 (three) times daily.  09/03/19  Yes Fulp, Cammie, MD  glipiZIDE (GLUCOTROL) 10 MG tablet Take 1 tablet (10 mg total) by mouth 2 (two)  times daily before a meal. 07/27/19  Yes Fulp, Cammie, MD  glucose blood (TRUE METRIX BLOOD GLUCOSE TEST) test strip Use as instructed Patient taking differently: 1 each by Other route as directed.  03/22/19  Yes Freeman Caldron M, PA-C  ibuprofen (ADVIL) 800 MG tablet Take 1 tablet (800 mg total) by mouth every 8 (eight) hours as needed. Patient taking differently: Take 800 mg by mouth every 8 (eight) hours as needed for mild pain or moderate pain.  10/16/19  Yes Kinsinger, Arta Bruce, MD  indomethacin (INDOCIN) 50 MG capsule Take 50 mg by mouth 2 (two) times daily with a meal. 02/11/20  Yes [provider]  insulin glargine (LANTUS SOLOSTAR) 100 UNIT/ML Solostar Pen Inject 50 Units into the skin daily. 10/31/19  Yes Fulp, Cammie, MD  Insulin Pen Needle (TRUEPLUS PEN NEEDLES) 32G X 4 MM MISC Use as instructed. Patient taking differently: 1 each by Other route as directed.  10/10/19  Yes Fulp, Cammie, MD  lisinopril (ZESTRIL) 20 MG tablet Take 20 mg by mouth daily. 02/18/20  Yes [provider]  meloxicam (MOBIC) 15 MG tablet Take 15 mg by mouth daily. 02/06/20  Yes [provider]  metFORMIN (GLUCOPHAGE) 500 MG tablet Take 1 tablet (500 mg total) by mouth 2 (two) times daily with a meal. 07/27/19  Yes Fulp, Cammie, MD  pantoprazole (PROTONIX) 40 MG tablet Take 1 tablet (40 mg total) by mouth daily. 07/27/19  Yes Fulp, Cammie, MD  sulfamethoxazole-trimethoprim (BACTRIM DS) 800-160 MG tablet Take 1 tablet by mouth 2 (two) times daily. 02/29/20  Yes [provider]  topiramate (TOPAMAX) 25 MG tablet Take 25 mg by mouth at bedtime.  02/05/20  Yes [provider]  TRUEplus Lancets 28G MISC 1 each by Does not apply route 2 (two) times a day. 03/22/19  Yes McClung, Dionne Bucy, PA-C  Vitamin D, Ergocalciferol, (DRISDOL) 1.25 MG (50000 UNIT) CAPS capsule Take 50,000 Units by mouth once a week. 01/30/20  Yes [provider]  ALLERGY RELIEF 180 MG tablet Take 180 mg by  mouth daily. Patient not taking: Reported on 03/05/2020 02/26/20   [provider]  famotidine (PEPCID) 20 MG tablet Take 1 tablet (20 mg total) by mouth 2 (two) times daily. Patient not taking: Reported on 10/05/2019 07/27/19   Fulp, Ander Gaster, MD  gabapentin (NEURONTIN) 600 MG tablet Take 600 mg by mouth 3 (three) times daily. Patient not taking: Reported on 03/05/2020 01/22/20   [provider]  oxyCODONE (OXY IR/ROXICODONE) 5 MG immediate release tablet Take 1 tablet (5 mg total) by mouth every 6 (six) hours as needed for severe pain. 10/16/19   Kinsinger, Arta Bruce, MD  pregabalin (LYRICA) 100 MG capsule Take 1 capsule (100 mg total) by mouth 2 (two) times daily. Patient not taking: Reported on 10/05/2019 09/07/19   Charlott Rakes, MD  sertraline (ZOLOFT) 25 MG tablet TAKE 1 TABLET (  25 MG TOTAL) BY MOUTH DAILY. Patient not taking: Reported on 03/05/2020 10/31/19   Fulp, Ander Gaster, MD  sodium chloride (OCEAN) 0.65 % SOLN nasal spray Place 1 spray into both nostrils as needed for congestion. Patient not taking: Reported on 03/13/2019 07/24/18 03/13/19  Arville Lime, [place]    Physical Exam: Vitals:   03/05/20 1150 03/05/20 1245 03/05/20 1300 03/05/20 1430  BP: (!) 99/57 102/60 (!) 103/57 115/60  Pulse: 94 91 96 86  Resp: _0 Temp: 98.2 F (36.8 C)     TempSrc: Oral     SpO2: 96% 97% 100% 98%  Weight:      Height:        Constitutional: NAD, calm, comfortable Vitals:   03/05/20 1150 03/05/20 1245 03/05/20 1300 03/05/20 1430  BP: (!) 99/57 102/60 (!) 103/57 115/60  Pulse: 94 91 96 86  Resp: _1 Temp: 98.2 F (36.8 C)     TempSrc: Oral     SpO2: 96% 97% 100% 98%  Weight:      Height:       Eyes: PERRL, lids and conjunctivae normal ENMT: Mucous membranes are moist. Posterior pharynx clear of any exudate or lesions.Normal dentition.  Neck: normal, supple, no masses, no thyromegaly Respiratory: clear to auscultation bilaterally, no wheezing, no crackles.  Normal respiratory effort. No accessory muscle use.  Cardiovascular: Regular rate and rhythm, no murmurs / rubs / gallops. No extremity edema. 2+ pedal pulses. No carotid bruits.  Abdomen: no tenderness, no masses palpated. No hepatosplenomegaly. Bowel sounds positive.  Musculoskeletal: no clubbing / cyanosis. No joint deformity upper and lower extremities. Good ROM, no contractures. Normal muscle tone.  Skin: Left fifth toe ulcers with purulent discharge Neurologic: CN 2-12 grossly intact. Sensation intact, DTR normal. Strength 5/5 in all 4.  Psychiatric: Normal judgment and insight. Alert and oriented x 3. Normal mood.       (Anything < 9 systems with 2 bullets each down codes to level 1) (If patient refuses exam cant bill higher level) (Make sure to document decubitus ulcers present on admission -- if possible -- and whether patient has chronic indwelling catheter at time of admission)  Labs on Admission: I have personally reviewed following labs and imaging studies  CBC: Recent Labs  Lab 03/04/20 1253 03/05/20 1328  WBC 10.8* 10.3  NEUTROABS 6.7 6.0  HGB 9.7* 9.8*  HCT 30.4* 32.7*  MCV 86.1 88.9  PLT 337 115   Basic Metabolic Panel: Recent Labs  Lab 03/04/20 1253 03/05/20 1328  NA 136 139  K 5.2* 5.1  CL 103 105  CO2 22 23  GLUCOSE 160* 119*  BUN 28* 23*  CREATININE 1.75* 1.40*  CALCIUM 8.7* 9.0   GFR: Estimated Creatinine Clearance: 67.9 mL/min (A) (by C-G formula based on SCr of 1.4 mg/dL (H)). Liver Function Tests: Recent Labs  Lab 03/04/20 1253 03/05/20 1328  AST 16 17  ALT 13 15  ALKPHOS 72 80  BILITOT 0.4 0.4  PROT 6.6 6.8  ALBUMIN 3.0* 3.0*   No results for input(s): LIPASE, AMYLASE in the last 168 hours. No results for input(s): AMMONIA in the last 168 hours. Coagulation Profile: No results for input(s): INR, PROTIME in the last 168 hours. Cardiac Enzymes: No results for input(s): CKTOTAL, CKMB, CKMBINDEX, TROPONINI in the last 168  hours. BNP (last 3 results) No results for input(s): PROBNP in the last 8760 hours. HbA1C: Recent Labs    03/05/20 1518  HGBA1C 7.3*  CBG: Recent Labs  Lab 03/05/20 0909  GLUCAP 170*   Lipid Profile: No results for input(s): CHOL, HDL, LDLCALC, TRIG, CHOLHDL, LDLDIRECT in the last 72 hours. Thyroid Function Tests: No results for input(s): TSH, T4TOTAL, FREET4, T3FREE, THYROIDAB in the last 72 hours. Anemia Panel: No results for input(s): VITAMINB12, FOLATE, FERRITIN, TIBC, IRON, RETICCTPCT in the last 72 hours. Urine analysis:    Component Value Date/Time   COLORURINE YELLOW 09/05/2016 1300   APPEARANCEUR CLEAR 09/05/2016 1300   LABSPEC 1.018 09/05/2016 1300   PHURINE 6.0 09/05/2016 1300   GLUCOSEU 50 (A) 09/05/2016 1300   HGBUR NEGATIVE 09/05/2016 1300   BILIRUBINUR NEGATIVE 09/05/2016 1300   KETONESUR NEGATIVE 09/05/2016 1300   PROTEINUR NEGATIVE 09/05/2016 1300   NITRITE NEGATIVE 09/05/2016 1300   LEUKOCYTESUR NEGATIVE 09/05/2016 1300    Radiological Exams on Admission: DG Foot Complete Left  Result Date: 03/05/2020 CLINICAL DATA:  Fifth toe infection. EXAM: LEFT FOOT - COMPLETE 3+ VIEW COMPARISON:  Left fifth toe x-rays from yesterday. FINDINGS: Unchanged cortical irregularity along the lateral head of the fifth proximal phalanx. No acute fracture or dislocation. Joint spaces are preserved. Bone mineralization is normal. Soft tissue swelling of the forefoot and fifth toe. IMPRESSION: 1. Unchanged cortical irregularity along the lateral head of the fifth proximal phalanx, suspicious for osteomyelitis. Electronically Signed   By: Titus Dubin M.D.   On: 03/05/2020 13:32   DG Toe 5th Left  Result Date: 03/04/2020 CLINICAL DATA:  Soft tissue ulcer rule out osteomyelitis. EXAM: DG TOE 5TH LEFT COMPARISON:  None. FINDINGS: Negative for fracture. No cortical erosion or osteomyelitis. No significant degenerative change Soft tissue swelling and wound noted. IMPRESSION:  Negative for fracture or osteomyelitis. Electronically Signed   By: Franchot Gallo M.D.   On: 03/04/2020 13:07    EKG: Independently reviewed.  Sinus rhythm, no acute ST-T changes  Assessment/Plan Active Problems:   Osteomyelitis ([place])  (please populate well all problems here in Problem List. (For example, if patient is on BP meds at home and you resume or decide to hold them, it is a problem that needs to be her. Same for CAD, COPD, HLD and so on)  Acute versus subacute osteomyelitis of left fifth toe and left metatarsal bone -Broadened coverage with vancomycin and Zosyn -X-ray shows signs of osteomyelitis, contact podiatrist office for consultation, may need I&D.  Given the significant finding on x-ray, I do not think MRI is necessary.  Sepsis secondary to osteomyelitis -She has elevated lactic acid and she has endorgan damage of AKI. -Antibiotics as above, hold BP meds, start maintenance IV fluids  IDDM -Blood glucose borderline, will cut down her Lantus -Add sliding scale coverage  AKI -IVF and repeat BMP -Hold BP meds, hold Flexeril, cutdown gabapentin dosage  HTN -Hold BP meds  DVT prophylaxis: Heparin subcu Code Status: Full code Family Communication: None at bedside Disposition Plan: Patient with complicated wound infection on left foot, likely will need podiatry input and may need long-term IV antibiotics Consults called: Podiatry Admission status: Tele admission   Lequita Halt MD [facility] Pager [phone]    03/05/2020, 4:39 PM

## 2020-03-05 NOTE — ED Notes (Signed)
Wound on pt left foot cleaned and flushed. Dressing applied per order.

## 2020-03-05 NOTE — ED Notes (Signed)
Blood cultures obtained in triage 

## 2020-03-05 NOTE — ED Triage Notes (Addendum)
Pt states she was here yesterday for an infection to her left foot, had blood taken but LWBS, reports she was called and told she has an infection in her blood and needs to return for further treatment (per chart, lactic 2.0), denies fevers or chills. A/ox4, resp e/u, nad.

## 2020-03-05 NOTE — Progress Notes (Signed)
Pharmacy Antibiotic Note  Maria Morris is a 39 y.o. female admitted on 03/05/2020 with cellulitis.  Pharmacy has been consulted for Vancomycin dosing.   Height: 5\' 7"  (170.2 cm) Weight: 103 kg (227 lb) IBW/kg (Calculated) : 61.6  Temp (24hrs), Avg:98.3 F (36.8 C), Min:98.2 F (36.8 C), Max:98.4 F (36.9 C)  Recent Labs  Lab 03/04/20 1253 03/05/20 0916  WBC 10.8*  --   CREATININE 1.75*  --   LATICACIDVEN 2.0* 2.2*    Estimated Creatinine Clearance: 54.3 mL/min (A) (by C-G formula based on SCr of 1.75 mg/dL (H)).    No Known Allergies  Antimicrobials this admission: 7/14 Ceftriaxone >>  7/14 Vancomycin >>   Dose adjustments this admission: N/a  Microbiology results: Pending   Plan:  - Vancomycin 2000mg  IV x 1 dose  - Followed by Vancomycin 1250mg  IV q24h (nomogram dosing)  - Monitor patients renal function and urine output  - De-escalate ABX when appropriate   Thank you for allowing pharmacy to be a part of this patient's care.  8/14 PharmD. BCPS 03/05/2020 1:04 PM

## 2020-03-05 NOTE — Consult Note (Signed)
WOC Nurse Consult Note: Reason for Consult: Left foot, 5th digit full thickness wound. +osteomyelitis.  Podiatry has been consulted. Wound type: Infectious, neuropathic Pressure Injury POA: N/A Measurement: entire 5th digit of left foot is affected.  Additionally, there is a 3cm x 5cm area of erythema at the dorsal foot, accompanied by edema and warmth Wound bed: red, moist  Drainage (amount, consistency, odor) small amount serosanguinous on old dressing Periwound: As noted above, edematous, erythematous, warm Dressing procedure/placement/frequency: I will provide Nursing with guidance for the topical care of this wound. Podiatry has been consulted and when they assess, a surgical procedure may be indicated. Any orders from podiatric medicine will supercede mine. Hospitalist has initiated antibiotic therapy.  WOC nursing team will not follow, but will remain available to this patient, the nursing and medical teams.  Please re-consult if needed. Thanks, Ladona Mow, MSN, RN, GNP, [place]  Pager# [phone]

## 2020-03-05 NOTE — Telephone Encounter (Signed)
Ok thanks will go see her in the Morning

## 2020-03-05 NOTE — Telephone Encounter (Signed)
Dr. Chipper Herb from [facility] called requesting a consult for patient. Patient will be seen for multiple wounds and osteomyelitis to left 5th toe.

## 2020-03-06 ENCOUNTER — Inpatient Hospital Stay (HOSPITAL_COMMUNITY): Payer: 59

## 2020-03-06 ENCOUNTER — Encounter (HOSPITAL_COMMUNITY): Payer: Self-pay | Admitting: Internal Medicine

## 2020-03-06 DIAGNOSIS — M869 Osteomyelitis, unspecified: Secondary | ICD-10-CM

## 2020-03-06 DIAGNOSIS — Z794 Long term (current) use of insulin: Secondary | ICD-10-CM

## 2020-03-06 DIAGNOSIS — E119 Type 2 diabetes mellitus without complications: Secondary | ICD-10-CM

## 2020-03-06 DIAGNOSIS — M86679 Other chronic osteomyelitis, unspecified ankle and foot: Secondary | ICD-10-CM

## 2020-03-06 DIAGNOSIS — E1165 Type 2 diabetes mellitus with hyperglycemia: Secondary | ICD-10-CM

## 2020-03-06 LAB — CBC
HCT: 28.6 % — ABNORMAL LOW (ref 36.0–46.0)
Hemoglobin: 8.9 g/dL — ABNORMAL LOW (ref 12.0–15.0)
MCH: 27.4 pg (ref 26.0–34.0)
MCHC: 31.1 g/dL (ref 30.0–36.0)
MCV: 88 fL (ref 80.0–100.0)
Platelets: 313 10*3/uL (ref 150–400)
RBC: 3.25 MIL/uL — ABNORMAL LOW (ref 3.87–5.11)
RDW: 15.9 % — ABNORMAL HIGH (ref 11.5–15.5)
WBC: 8.6 10*3/uL (ref 4.0–10.5)
nRBC: 0 % (ref 0.0–0.2)

## 2020-03-06 LAB — BASIC METABOLIC PANEL
Anion gap: 10 (ref 5–15)
BUN: 18 mg/dL (ref 6–20)
CO2: 22 mmol/L (ref 22–32)
Calcium: 8 mg/dL — ABNORMAL LOW (ref 8.9–10.3)
Chloride: 105 mmol/L (ref 98–111)
Creatinine, Ser: 1.59 mg/dL — ABNORMAL HIGH (ref 0.44–1.00)
GFR calc Af Amer: 48 mL/min — ABNORMAL LOW (ref >60)
GFR calc non Af Amer: 41 mL/min — ABNORMAL LOW (ref >60)
Glucose, Bld: 185 mg/dL — ABNORMAL HIGH (ref 70–99)
Potassium: 5.4 mmol/L — ABNORMAL HIGH (ref 3.5–5.1)
Sodium: 137 mmol/L (ref 135–145)

## 2020-03-06 LAB — GLUCOSE, CAPILLARY
Glucose-Capillary: 110 mg/dL — ABNORMAL HIGH (ref 70–99)
Glucose-Capillary: 175 mg/dL — ABNORMAL HIGH (ref 70–99)
Glucose-Capillary: 157 mg/dL — ABNORMAL HIGH (ref 70–99)
Glucose-Capillary: 115 mg/dL — ABNORMAL HIGH (ref 70–99)

## 2020-03-06 LAB — HIV ANTIBODY (ROUTINE TESTING W REFLEX): HIV Screen 4th Generation wRfx: NONREACTIVE

## 2020-03-06 IMAGING — MR MR FOOT*L* W/O CM
4 of 6 series · 19 of 40 positions shown · non-contrast
Comparison: Left foot x-rays from yesterday.

CLINICAL DATA: Fifth toe infection.

EXAM:
MRI OF THE LEFT FOOT WITHOUT CONTRAST
TECHNIQUE: Multiplanar, multisequence MR imaging of the left forefoot was
performed. No intravenous contrast was administered.

[Series 3: T1 · coronal · 3.0mm · 0.27mm/px · 5 of 35 slices shown (1 of 2)]
[im 1/35]
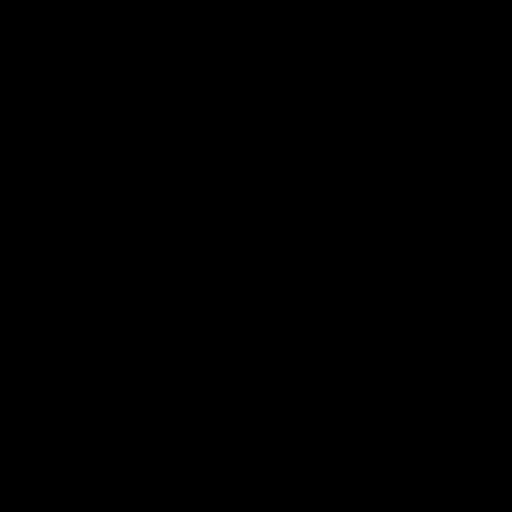
[im 5/35]
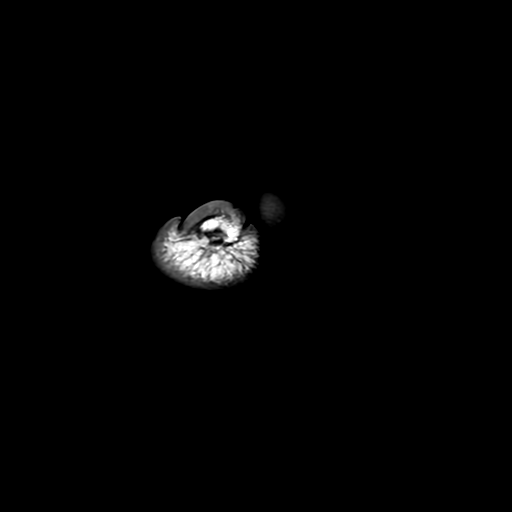
[im 10/35]
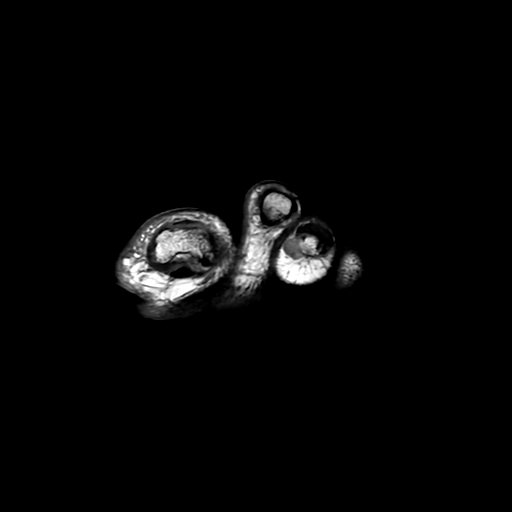
[im 20/35]
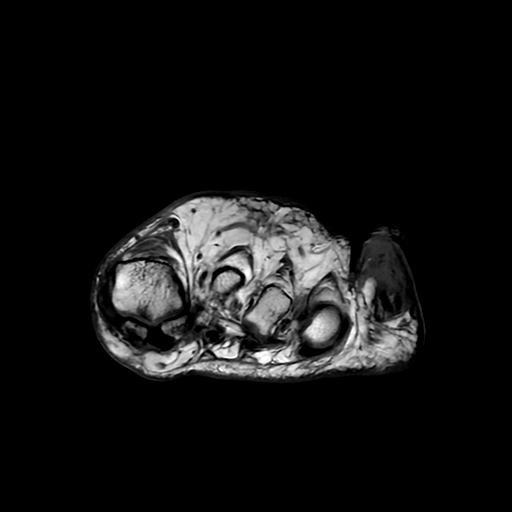
[im 30/35]
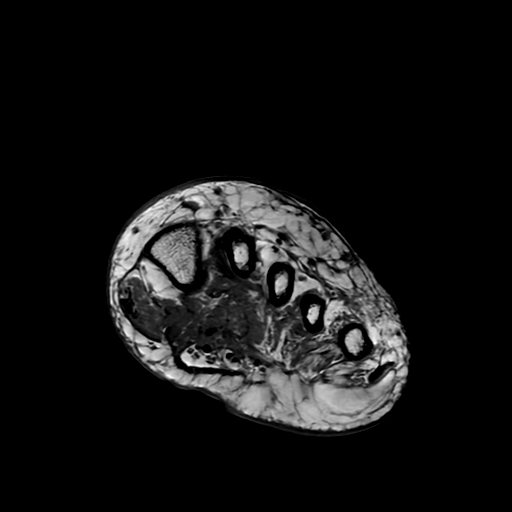

[Series 4: T1 fat-sat · coronal · non-contrast · 3.0mm · 0.27mm/px · 3 of 35 slices shown]
[im 5/35]
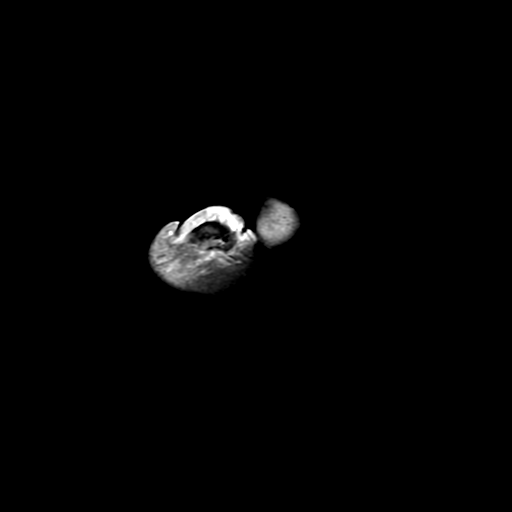
[im 20/35]
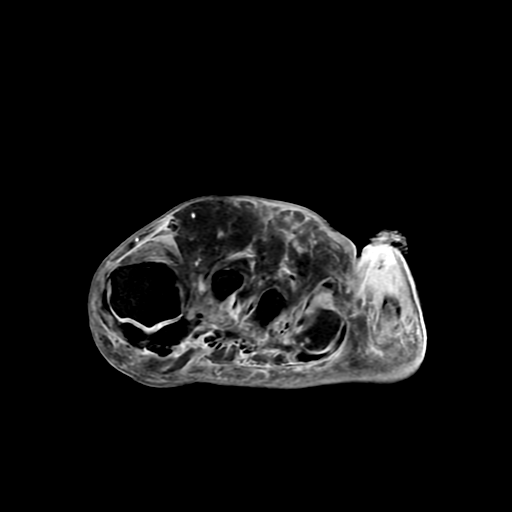
[im 30/35]
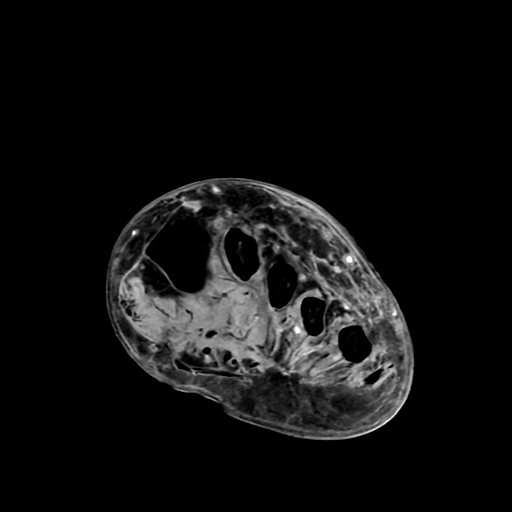

[Series 5: T2 fat-sat · coronal · 3.0mm · 0.27mm/px · 8 of 35 slices shown]
[im 1/35]
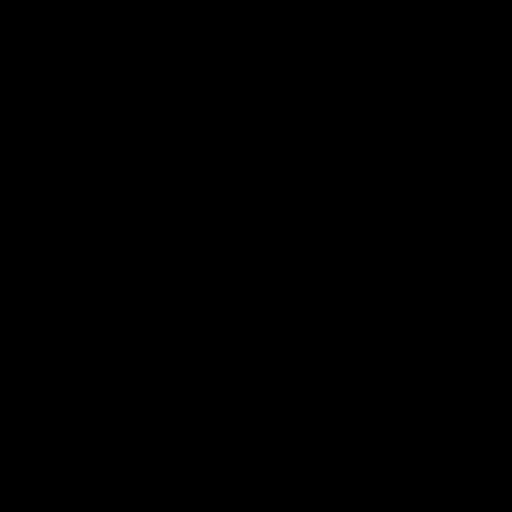
[im 5/35]
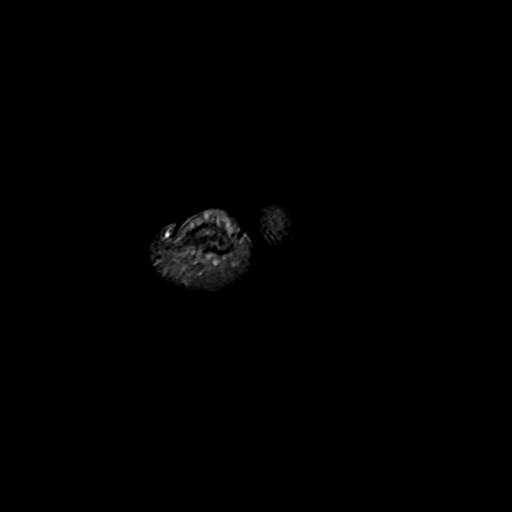
[im 10/35]
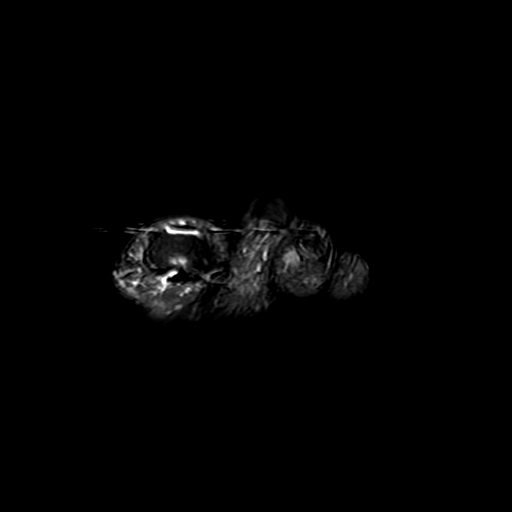
[im 15/35]
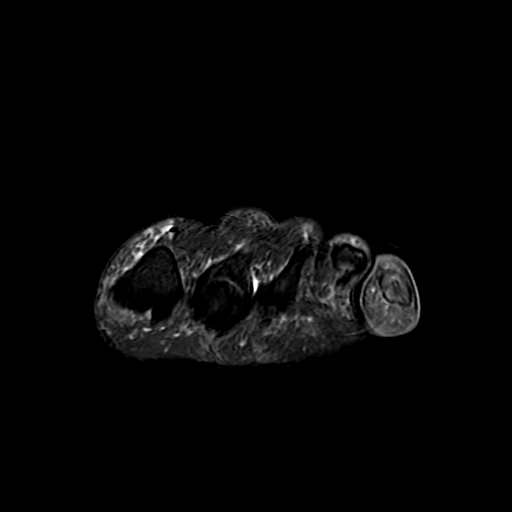
[im 20/35]
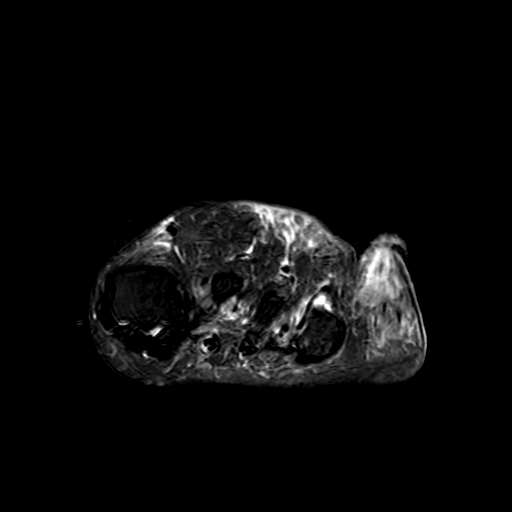
[im 25/35]
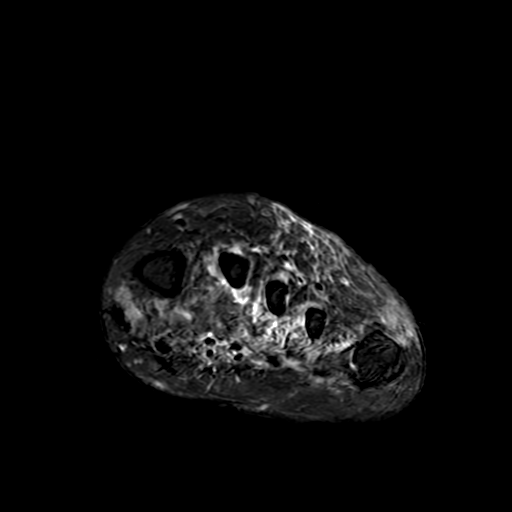
[im 30/35]
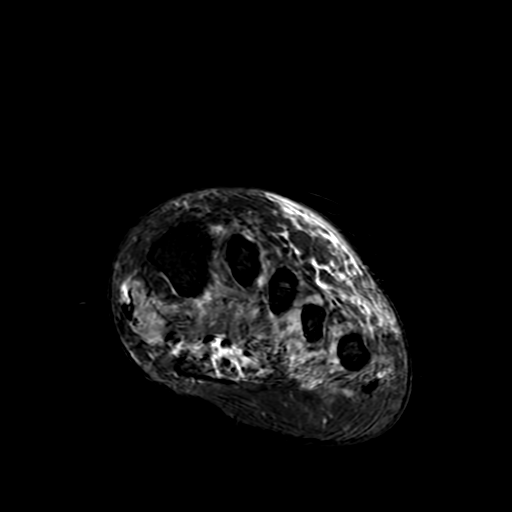
[im 35/35]
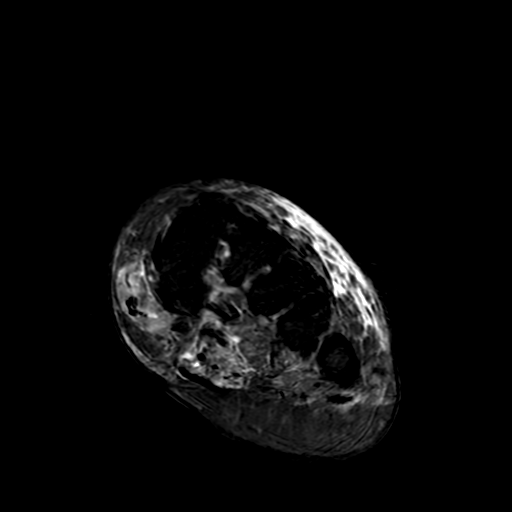

[Series 7: T1 · axial · 3.0mm · 0.27mm/px · z∈[-41,+23]mm · 3 of 22 slices shown (2 of 2)]
[im 1/22]
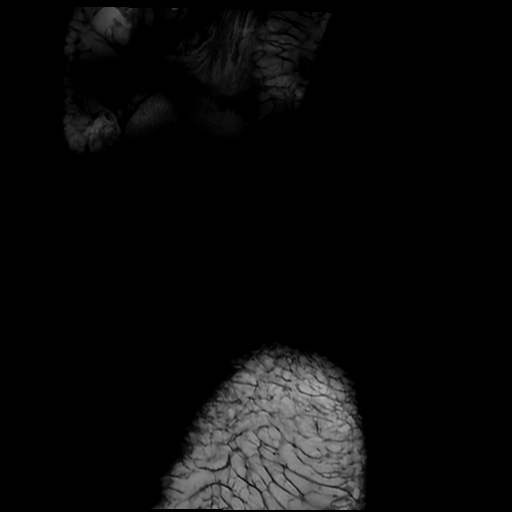
[im 11/22]
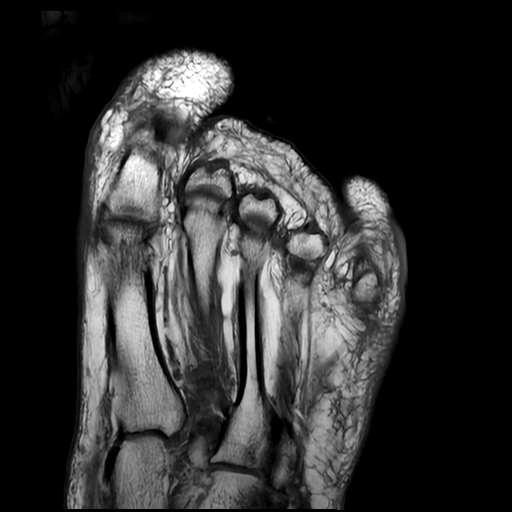
[im 22/22]
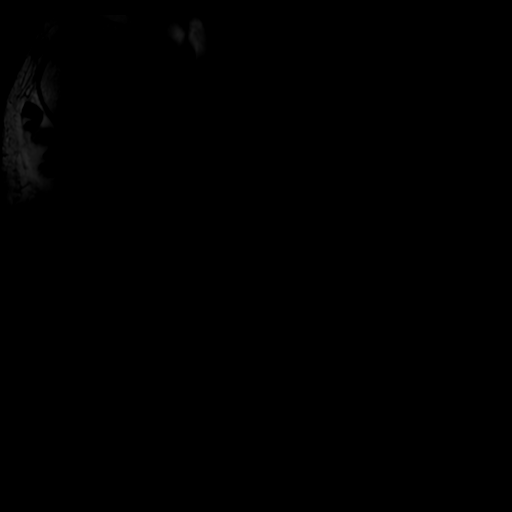

[19 of 40 positions shown; findings below may reference images not displayed]

FINDINGS: Bones/Joint/Cartilage

Abnormal marrow edema with corresponding decreased T1 marrow signal
involving the fifth proximal phalanx head and shaft as well as the
fifth middle phalanx. No fracture or dislocation. Small first MTP
joint effusion.

Ligaments

Collateral ligaments are intact.

Muscles and Tendons
Intact. No tenosynovitis. Increased T2 signal within the intrinsic
muscles of the forefoot, nonspecific, but likely related to diabetic
muscle changes.

Soft tissue
Skin ulcerations along the dorsal and lateral aspects of the fifth
toe. Prominent skin thickening and soft tissue swelling of the fifth
toe with phlegmon surrounding fifth PIP joint tiny 7 x 4 mm fluid
collection along the dorsal base of the fifth proximal phalanx
(series 8, image 24).

There is a 7 x 5 x 6 mm oval T2 hyperintense, T1 hypointense lesion
along the medial aspect of the third distal phalanx (series 6, image
15).
IMPRESSION: 1. Skin ulcerations along the dorsal and lateral aspects of the
fifth toe with associated soft tissue infection, osteomyelitis of
the fifth proximal and middle phalanges, and early abscess
formation.
2. Non-aggressive appearing but nonspecific 7 mm soft tissue mass
along the medial aspect of the third distal phalanx, incompletely
characterized without intravenous contrast. Short-term follow-up
study with and without contrast is recommended to determine if the
lesion is cystic or solid. Excisional biopsy suggested if definitive
diagnosis is desired.

## 2020-03-06 MED ORDER — VANCOMYCIN HCL 500 MG/100ML IV SOLN
500.0000 mg | Freq: Once | INTRAVENOUS | Status: DC
  Filled 2020-03-06: qty 100

## 2020-03-06 MED ORDER — VANCOMYCIN HCL 1250 MG/250ML IV SOLN
1250.0000 mg | INTRAVENOUS | Status: DC
  Filled 2020-03-06: qty 250

## 2020-03-06 MED ORDER — VANCOMYCIN HCL 1750 MG/350ML IV SOLN: 1750.0000 mg | INTRAVENOUS | Status: DC

## 2020-03-06 NOTE — Progress Notes (Signed)
Pharmacy Antibiotic Note  Maria Morris is a 39 y.o. female admitted on 03/05/2020 with cellulitis > osteomyelitis of L 5th toe per MRI.  Also covering for sepsis. Pharmacy has been consulted for Vancomycin dosing.       Dosed on 227 lbs (103 kg) on 7/14 but actual weight is 327 lbs (148 kg).  Loading dose given 7/14 and Vanc 1250 mg IV infusing now.     Ceftriaxone x 1 given 7/14 then broadened to Zosyn.    Creatinine fluctuating, 1.75>1.40>1.59. BUN 28>23>18.   Discussed with Dr. Jola Schmidt.  Plan:  Continue Vancomycin 1250 mg IV q24h for now  Empiric regimen per our nomogram would be 1750 mg IV q24h for her body size and renal function, but keeping lower dose for now.  Target troughs 15-20 mcg/ml  Also on Zosyn 3.375 gm IV q8hrs (each over 4 hours)  Follow renal function, culture data, clinical progress and antibiotic plans.  Follow up Podiatry plans.  Height: 5\' 7"  (170.2 cm) Weight: (!) 148.3 kg (327 lb) IBW/kg (Calculated) : 61.6  Temp (24hrs), Avg:98 F (36.7 C), Min:97.8 F (36.6 C), Max:98.4 F (36.9 C)  Recent Labs  Lab 03/04/20 1253 03/05/20 0916 03/05/20 1326 03/05/20 1328 03/06/20 0614  WBC 10.8*  --   --  10.3 8.6  CREATININE 1.75*  --   --  1.40* 1.59*  LATICACIDVEN 2.0* 2.2* 1.9  --   --     Estimated Creatinine Clearance: 73.6 mL/min (A) (by C-G formula based on SCr of 1.59 mg/dL (H)).    No Known Allergies  Antimicrobials this admission: Ceftriaxone x 1 on 7/14 Zosyn 7/14 >> Vancomycin 7/14 >>  Dose adjustments this admission:  n/a  Microbiology results:  7/14 blood x 2: no growth < 24 hrs to date  7/14 COVID: negative  Thank you for allowing pharmacy to be a part of this patient's care.  8/14, Dennie Fetters Phone: [phone] 03/06/2020 4:50 PM

## 2020-03-06 NOTE — Consult Note (Signed)
Podiatry consult note  To Dr. Chipper HerbZhang From Dr. Marylene LandStover ([facility])  Re: Osteomyelitis  History of present illness: 39 year old diabetic female patient seen at bedside for evaluation of left foot for concern of osteomyelitis.  Patient reports that she has had a history of an ulceration to her baby toe for several months which likely started from a shoe rubbing her toe and was being treated by her primary care doctor; She was waiting to be referred to a wound center but then very quickly the toe worsened.  Patient reports that it started out as a small ulcer on the side of the toe but then also she got an secondary ulcer at the top that did not get better hence her doctor sent her to the emergency room.  Patient reports that she was diagnosed with diabetes in 2017 and has a family history of diabetes.  Patient reports that this is the first time she has had issues with a wound or sore on the foot.  Patient denies nausea vomiting fever chills or any other constitutional symptoms this morning.  Patient Active Problem List   Diagnosis Date Noted  . Osteomyelitis (HCC) 03/05/2020  . Well woman exam with routine gynecological exam 07/26/2019  . Breast pain, left 07/26/2019    Current Facility-Administered Medications:  .  0.9 %  sodium chloride infusion, , Intravenous, Continuous, Mikey CollegeZhang, Ping T, MD, Last Rate: 125 mL/hr at 03/06/20 0614, New Bag at 03/06/20 0614 .  acetaminophen (TYLENOL) tablet 650 mg, 650 mg, Oral, TID PRN, Mikey CollegeZhang, Ping T, MD .  buPROPion (WELLBUTRIN XL) 24 hr tablet 150 mg, 150 mg, Oral, Daily, Zhang, Ping T, MD .  DULoxetine (CYMBALTA) DR capsule 60 mg, 60 mg, Oral, Daily, Mikey CollegeZhang, Ping T, MD, 60 mg at 03/05/20 1717 .  ferrous gluconate (FERGON) tablet 324 mg, 324 mg, Oral, Q breakfast, Zhang, Ping T, MD .  fluticasone [facility](FLONASE) 50 MCG/ACT nasal spray 1 spray, 1 spray, Each Nare, Daily PRN, Mikey CollegeZhang, Ping T, MD .  gabapentin (NEURONTIN) capsule 300 mg, 300 mg, Oral, TID,  Mikey CollegeZhang, Ping T, MD, 300 mg at 03/05/20 2303 .  heparin injection 5,000 Units, 5,000 Units, Subcutaneous, Q8H, Emeline GeneralZhang, Ping T, MD, 5,000 Units at 03/06/20 0618 .  ibuprofen (ADVIL) tablet 800 mg, 800 mg, Oral, Q8H PRN, Mikey CollegeZhang, Ping T, MD .  insulin aspart (novoLOG) injection 0-9 Units, 0-9 Units, Subcutaneous, TID WC, Zhang, Ping T, MD .  insulin glargine (LANTUS) injection 25 Units, 25 Units, Subcutaneous, Daily, Emeline GeneralZhang, Ping T, MD, 25 Units at 03/05/20 2302 .  oxyCODONE (Oxy IR/ROXICODONE) immediate release tablet 5 mg, 5 mg, Oral, Q6H PRN, Mikey CollegeZhang, Ping T, MD .  pantoprazole (PROTONIX) EC tablet 40 mg, 40 mg, Oral, Daily, Mikey CollegeZhang, Ping T, MD, 40 mg at 03/05/20 1717 .  [COMPLETED] piperacillin-tazobactam (ZOSYN) IVPB 3.375 g, 3.375 g, Intravenous, Once, Stopped at 03/05/20 1748 **FOLLOWED BY** piperacillin-tazobactam (ZOSYN) IVPB 3.375 g, 3.375 g, Intravenous, Q8H, Joaquim Laiakins, Jason, RPH, Last Rate: 12.5 mL/hr at 03/06/20 0618, 3.375 g at 03/06/20 0618 .  topiramate (TOPAMAX) tablet 25 mg, 25 mg, Oral, QHS, Mikey CollegeZhang, Ping T, MD, 25 mg at 03/05/20 2303 .  [COMPLETED] vancomycin (VANCOCIN) 2,000 mg in sodium chloride 0.9 % 500 mL IVPB, 2,000 mg, Intravenous, Once, Stopped at 03/05/20 1712 **FOLLOWED BY** Vancomycin (VANCOCIN) 1,250 mg in sodium chloride 0.9 % 250 mL IVPB, 1,250 mg, Intravenous, Q24H, Joaquim Laiakins, Jason, RPH .  Vitamin D (Ergocalciferol) (DRISDOL) capsule 50,000 Units, 50,000 Units, Oral, Weekly, Mikey CollegeZhang, Ping  T, MD  No Known Allergies  Recent Results (from the past 2160 hour(s))  Lactic acid, plasma     Status: Abnormal   Collection Time: 03/04/20 12:53 PM  Result Value Ref Range   Lactic Acid, Venous 2.0 (HH) 0.5 - 1.9 mmol/L    Comment: CRITICAL RESULT CALLED TO, READ BACK BY AND VERIFIED WITH: SOMMERS,C RN @1429  ON BY FLEMINGS Performed at [facility], [address] [place], [place]   Comprehensive metabolic panel     Status: Abnormal   Collection Time: 03/04/20 12:53  PM  Result Value Ref Range   Sodium 136 135 - 145 mmol/L   Potassium 5.2 (H) 3.5 - 5.1 mmol/L   Chloride 103 98 - 111 mmol/L   CO2 22 22 - 32 mmol/L   Glucose, Bld 160 (H) 70 - 99 mg/dL    Comment: Glucose reference range applies only to samples taken after fasting for at least 8 hours.   BUN 28 (H) 6 - 20 mg/dL   Creatinine, Ser 03/06/20 (H) 0.44 - 1.00 mg/dL   Calcium 8.7 (L) 8.9 - 10.3 mg/dL   Total Protein 6.6 6.5 - 8.1 g/dL   Albumin 3.0 (L) 3.5 - 5.0 g/dL   AST 16 15 - 41 U/L   ALT 13 0 - 44 U/L   Alkaline Phosphatase 72 38 - 126 U/L   Total Bilirubin 0.4 0.3 - 1.2 mg/dL   GFR calc non Af Amer 37 (L) >60 mL/min   GFR calc Af Amer 42 (L) >60 mL/min   Anion gap 11 5 - 15    Comment: Performed at [facility], [address] [place], [place]  CBC with Differential     Status: Abnormal   Collection Time: 03/04/20 12:53 PM  Result Value Ref Range   WBC 10.8 (H) 4.0 - 10.5 K/uL   RBC 3.53 (L) 3.87 - 5.11 MIL/uL   Hemoglobin 9.7 (L) 12.0 - 15.0 g/dL   HCT 03/06/20 (L) 36 - 46 %   MCV 86.1 80.0 - 100.0 fL   MCH 27.5 26.0 - 34.0 pg   MCHC 31.9 30.0 - 36.0 g/dL   RDW 69.6 (H) 29.5 - 28.4 %   Platelets 337 150 - 400 K/uL   nRBC 0.0 0.0 - 0.2 %   Neutrophils Relative % 61 %   Neutro Abs 6.7 1.7 - 7.7 K/uL   Lymphocytes Relative 23 %   Lymphs Abs 2.5 0.7 - 4.0 K/uL   Monocytes Relative 8 %   Monocytes Absolute 0.8 0 - 1 K/uL   Eosinophils Relative 6 %   Eosinophils Absolute 0.6 (H) 0 - 0 K/uL   Basophils Relative 1 %   Basophils Absolute 0.1 0 - 0 K/uL   Immature Granulocytes 1 %   Abs Immature Granulocytes 0.05 0.00 - 0.07 K/uL    Comment: Performed at [facility], [address], [place], [place]  I-Stat beta hCG blood, ED     Status: Abnormal   Collection Time: 03/04/20  1:25 PM  Result Value Ref Range   I-stat hCG, quantitative 6.8 (H) <5 mIU/mL   Comment 3            Comment:   GEST. AGE      CONC.  (mIU/mL)   <=1 WEEK        5 - 50     2  WEEKS       50 -  500     3 WEEKS       100 - 10,000     4 WEEKS     1,000 - 30,000        FEMALE AND NON-PREGNANT FEMALE:     LESS THAN 5 mIU/mL   CBG monitoring, ED     Status: Abnormal   Collection Time: 03/05/20  9:09 AM  Result Value Ref Range   Glucose-Capillary 170 (H) 70 - 99 mg/dL    Comment: Glucose reference range applies only to samples taken after fasting for at least 8 hours.   Comment 1 Notify RN    Comment 2 Document in Chart   Lactic acid, plasma     Status: Abnormal   Collection Time: 03/05/20  9:16 AM  Result Value Ref Range   Lactic Acid, Venous 2.2 (HH) 0.5 - 1.9 mmol/L    Comment: CRITICAL VALUE NOTED.  VALUE IS CONSISTENT WITH PREVIOUSLY REPORTED AND CALLED VALUE. Performed at [facility], [address] [place], [place] [address]   I-Stat Beta hCG blood, ED ([facility], [facility], [facility] only)     Status: None   Collection Time: 03/05/20  1:15 PM  Result Value Ref Range   I-stat hCG, quantitative <5.0 <5 mIU/mL   Comment 3            Comment:   GEST. AGE      CONC.  (mIU/mL)   <=1 WEEK        5 - 50     2 WEEKS       50 - 500     3 WEEKS       100 - 10,000     4 WEEKS     1,000 - 30,000        FEMALE AND NON-PREGNANT FEMALE:     LESS THAN 5 mIU/mL   Lactic acid, plasma     Status: None   Collection Time: 03/05/20  1:26 PM  Result Value Ref Range   Lactic Acid, Venous 1.9 0.5 - 1.9 mmol/L    Comment: Performed at [facility], [address] [place], [place] [address]  Comprehensive metabolic panel     Status: Abnormal   Collection Time: 03/05/20  1:28 PM  Result Value Ref Range   Sodium 139 135 - 145 mmol/L   Potassium 5.1 3.5 - 5.1 mmol/L   Chloride 105 98 - 111 mmol/L   CO2 23 22 - 32 mmol/L   Glucose, Bld 119 (H) 70 - 99 mg/dL    Comment: Glucose reference range applies only to samples taken after fasting for at least 8 hours.   BUN 23 (H) 6 - 20 mg/dL   Creatinine, Ser 0.98 (H) 0.44 - 1.00 mg/dL   Calcium 9.0 8.9 - 11.9 mg/dL   Total Protein 6.8  6.5 - 8.1 g/dL   Albumin 3.0 (L) 3.5 - 5.0 g/dL   AST 17 15 - 41 U/L   ALT 15 0 - 44 U/L   Alkaline Phosphatase 80 38 - 126 U/L   Total Bilirubin 0.4 0.3 - 1.2 mg/dL   GFR calc non Af Amer 48 (L) >60 mL/min   GFR calc Af Amer 55 (L) >60 mL/min   Anion gap 11 5 - 15    Comment: Performed at [facility], [address], [place], [place] [address]  CBC with Differential     Status: Abnormal   Collection Time: 03/05/20  1:28 PM  Result Value Ref Range   WBC 10.3 4.0 - 10.5 K/uL   RBC 3.68 (L) 3.87 - 5.11 MIL/uL   Hemoglobin 9.8 (L) 12.0 - 15.0 g/dL   HCT 21.1 (L) 36 - 46 %   MCV 88.9 80.0 - 100.0 fL   MCH 26.6 26.0 - 34.0 pg   MCHC 30.0 30.0 - 36.0 g/dL   RDW 94.1 (H) 74.0 - 81.4 %   Platelets 342 150 - 400 K/uL   nRBC 0.0 0.0 - 0.2 %   Neutrophils Relative % 57 %   Neutro Abs 6.0 1.7 - 7.7 K/uL   Lymphocytes Relative 26 %   Lymphs Abs 2.7 0.7 - 4.0 K/uL   Monocytes Relative 8 %   Monocytes Absolute 0.8 0 - 1 K/uL   Eosinophils Relative 8 %   Eosinophils Absolute 0.8 (H) 0 - 0 K/uL   Basophils Relative 1 %   Basophils Absolute 0.1 0 - 0 K/uL   Immature Granulocytes 0 %   Abs Immature Granulocytes 0.04 0.00 - 0.07 K/uL    Comment: Performed at [facility], [address] [place], [place] [address]  SARS Coronavirus 2 by RT PCR (hospital order, performed in [facility] hospital lab) Nasopharyngeal Nasopharyngeal Swab     Status: None   Collection Time: 03/05/20  2:11 PM   Specimen: Nasopharyngeal Swab  Result Value Ref Range   SARS Coronavirus 2 NEGATIVE NEGATIVE    Comment: (NOTE) SARS-CoV-2 target nucleic acids are NOT DETECTED.  The SARS-CoV-2 RNA is generally detectable in upper and lower respiratory specimens during the acute phase of infection. The lowest concentration of SARS-CoV-2 viral copies this assay can detect is 250 copies / mL. A negative result does not preclude SARS-CoV-2 infection and should not be used as the sole basis for treatment or  other patient management decisions.  A negative result may occur with improper specimen collection / handling, submission of specimen other than nasopharyngeal swab, presence of viral mutation(s) within the areas targeted by this assay, and inadequate number of viral copies (<250 copies / mL). A negative result must be combined with clinical observations, patient history, and epidemiological information.  Fact Sheet for Patients:   BoilerBrush.com.cy  Fact Sheet for Healthcare Providers: https://pope.com/  This test is not yet approved or  cleared by the [place] FDA and has been authorized for detection and/or diagnosis of SARS-CoV-2 by FDA under an Emergency Use Authorization (EUA).  This EUA will remain in effect (meaning this test can be used) for the duration of the COVID-19 declaration under Section 564(b)(1) of the Act, 21 U.S.C. section 360bbb-3(b)(1), unless the authorization is terminated or revoked sooner.  Performed at [facility], [address] [place], [place] [address]   Hemoglobin A1c     Status: Abnormal   Collection Time: 03/05/20  3:18 PM  Result Value Ref Range   Hgb A1c MFr Bld 7.3 (H) 4.8 - 5.6 %    Comment: (NOTE) Pre diabetes:          5.7%-6.4%  Diabetes:              >6.4%  Glycemic control for   <7.0% adults with diabetes    Mean Plasma Glucose 162.81 mg/dL    Comment: Performed at [facility], [address] [place], [place] [address]  CBG monitoring, ED     Status: Abnormal   Collection Time: 03/05/20  5:16 PM  Result Value Ref Range  Glucose-Capillary 121 (H) 70 - 99 mg/dL    Comment: Glucose reference range applies only to samples taken after fasting for at least 8 hours.  CBC     Status: Abnormal   Collection Time: 03/06/20  6:14 AM  Result Value Ref Range   WBC 8.6 4.0 - 10.5 K/uL   RBC 3.25 (L) 3.87 - 5.11 MIL/uL   Hemoglobin 8.9 (L) 12.0 - 15.0 g/dL   HCT 25.4 (L)  36 - 46 %   MCV 88.0 80.0 - 100.0 fL   MCH 27.4 26.0 - 34.0 pg   MCHC 31.1 30.0 - 36.0 g/dL   RDW 27.0 (H) 62.3 - 76.2 %   Platelets 313 150 - 400 K/uL   nRBC 0.0 0.0 - 0.2 %    Comment: Performed at [facility], [address], [place], [place] [address]  Glucose, capillary     Status: Abnormal   Collection Time: 03/06/20  6:33 AM  Result Value Ref Range   Glucose-Capillary 175 (H) 70 - 99 mg/dL    Comment: Glucose reference range applies only to samples taken after fasting for at least 8 hours.   Objective: Blood pressure (!) 121/97, pulse 89, temperature 97.9 F (36.6 C), temperature source Oral, resp. rate 15, height 5\' 7"  (1.702 m), weight 103 kg, SpO2 95 %.  General no acute distress, resting in bed with nurse present  Focused lower extremity exam Vascular: DP and PT pedal pulses palpable, capillary return time intact all digits slight delay noted of the left fifth toe Neurological: Gross sensation present via light touch Musculoskeletal: Mild pain to palpation to left fifth toe Dermatological: There is dry skin noted plantar surfaces of both feet bilateral.  There is pinpoint ulcerations to the dorsal and lateral interphalangeal joint of the fifth toe the dorsal ulceration measures 0.5 x 0.5 and probes to fatty tissue the lateral ulceration measures 0.3 x 0.3 x 0.5 cm probes to bone there is clear to bloody drainage no purulence expressed there is significant edema and erythema to the left fifth toe that extends to the level of the metatarsophalangeal joint, there is minimal malodor, mild warmth.  No other acute findings noted.  Assessment and plan: Problem List Items Addressed This Visit    None    Visit Diagnoses    Osteomyelitis of left foot, unspecified type (HCC)    -  Primary   Relevant Medications   sulfamethoxazole-trimethoprim (BACTRIM DS) 800-160 MG tablet   cefTRIAXone (ROCEPHIN) 2 g in sodium chloride 0.9 % 100 mL IVPB (Completed)   vancomycin (VANCOCIN)  2,000 mg in sodium chloride 0.9 % 500 mL IVPB (Completed)   Vancomycin (VANCOCIN) 1,250 mg in sodium chloride 0.9 % 250 mL IVPB (Start on 03/06/2020  2:00 PM)     Patient seen and evaluated X-rays reviewed Will order MRI for further evaluation of extent of osteomyelitis concerns to the left fifth toe and for surgery planning Continue with broad-spectrum antibiotics Continue with local wound care with Xeroform and dry dressing to the area Patient may weight-bear for bathroom and out of bed to chair with assistance and with use of open toe surgical shoe which she has bedside Continue PRN meds for pain  Consult appreciated Podiatry to follow  Dr. 03/08/2020 [facility] Marylene Land office [phone] cell

## 2020-03-06 NOTE — Plan of Care (Signed)
  Problem: Education: Goal: Knowledge of General Education information will improve Description: Including pain rating scale, medication(s)/side effects and non-pharmacologic comfort measures Outcome: Progressing   Problem: Clinical Measurements: Goal: Ability to maintain clinical measurements within normal limits will improve Outcome: Progressing Goal: Diagnostic test results will improve Outcome: Progressing   Problem: Pain Managment: Goal: General experience of comfort will improve Outcome: Progressing   Problem: Elimination: Goal: Will not experience complications related to bowel motility Outcome: Progressing Goal: Will not experience complications related to urinary retention Outcome: Progressing   

## 2020-03-06 NOTE — Progress Notes (Signed)
PROGRESS NOTE    Patient: Maria Morris                            PCP: Antony Blackbird, MD                    DOB: 05/09/1981            DOA: 03/05/2020 IRJ:[id number]             DOS: 03/06/2020, 2:24 PM   LOS: 1 day   Date of Service: The patient was seen and examined on 03/06/2020  Subjective:   The patient was seen and examined this Am. Stable  Still complaining of left foot edema erythema, minimal pain Otherwise no issues overnight, remains afebrile normotensive States she has completed MRI of her foot.   Brief Narrative:  Maria Morris is a 39 y.o. female with medical history significant of IDDM, diabetic neuropathy, HTN, anxiety depression, presented with worsening of left foot infection.  Patient first developed a left toe cellulitis in April this year.  She went to see her PCP, and x-ray was done showed soft tissue infection, and patient was placed on 7 days course of doxycycline.  Swelling decreased but the wound was healed.  Patient went back to PCP but 1 week ago with worsening of swelling and recurrent discharge from the left fifth toe wound  Patient left the ED AMA but was called back due to sepsis finding, elevated lactic acidosis, x-ray was consistent with left fifth metatarsal head osteomyelitis. Patient was subsequently admitted, started on broad-spectrum antibiotics Podiatrist Dr. Cannon Kettle was consulted  Assessment & Plan:   Principal Problem:   Osteomyelitis ([place]) Active Problems:   DM II (diabetes mellitus, type II), controlled ([place])   Osteomyelitis-left fifth head toe metatarsal osteomyelitis -X-ray of foot consistent with the finding of osteomyelitis -Patient has been admitted -Podiatrist Dr. Cannon Kettle consulted  -MRI of the foot ordered -We will continue current IV antibiotic of Zosyn and vancomycin -Patient likely needs further I&D versus amputation   Sepsis due to osteomyelitis Patient met the criteria for SIRS/sepsis due to lactic acidosis,  AKI, hypotension Mild tachycardia -Lactic acid 2.0, 2.2  >> 1.9 -Responded well to IV fluid resuscitation, IV antibiotics -We will follow the blood cultures accordingly -We will continue monitor closely Currently afebrile normotensive  Acute renal insufficiency ?  CKD -Creatinine 1.75, 1.40, 1.59 -Contacted pharmacy regarding nephrotoxins including vancomycin dose adjustment  Diabetes mellitus typeII -uncontrolled -Mild hyperglycemia likely due to infection -Resuming Lantus, -A1c 7.3 -Checking CBG QA CHS, with SSI coverage  History of hypertension -Mildly hypotensive due to sepsis -We will holding home medication  Nutritional status:         Cultures; Blood Cultures x 2 >> NGT  Antimicrobials: 03/05/2020 IV vancomycin >> 03/05/2020 IV Zosyn >>    Consultants:  Dr. Cannon Kettle  ---------------------------------------------------------------------------------------------------------------------------------  DVT prophylaxis:  Heparin subcu  Code Status:   Code Status: Full Code Family Communication: No family member present at bedside- The above findings and plan of care has been discussed with patient in detail,  they expressed understanding and agreement of above. -Advance care planning has been discussed.   Admission status:    Status is: Inpatient  Remains inpatient appropriate because:Inpatient level of care appropriate due to severity of illness   Dispo: The patient is from: Home              Anticipated d/c is to:  Home              Anticipated d/c date is: 3 days              Patient currently is not medically stable to d/c.        Procedures:   No admission procedures for hospital encounter.     Antimicrobials:  Anti-infectives (From admission, onward)   Start     Dose/Rate Route Frequency Ordered Stop   03/06/20 1400  Vancomycin (VANCOCIN) 1,250 mg in sodium chloride 0.9 % 250 mL IVPB     Discontinue    "Followed by" Linked Group Details     1,250 mg 166.7 mL/hr over 90 Minutes Intravenous Every 24 hours 03/05/20 1303     03/05/20 2200  piperacillin-tazobactam (ZOSYN) IVPB 3.375 g     Discontinue    "Followed by" Linked Group Details   3.375 g 12.5 mL/hr over 240 Minutes Intravenous Every 8 hours 03/05/20 1530     03/05/20 1545  piperacillin-tazobactam (ZOSYN) IVPB 3.375 g       "Followed by" Linked Group Details   3.375 g 100 mL/hr over 30 Minutes Intravenous  Once 03/05/20 1530 03/05/20 1748   03/05/20 1530  piperacillin-tazobactam (ZOSYN) IVPB 3.375 g  Status:  Discontinued        3.375 g 100 mL/hr over 30 Minutes Intravenous  Once 03/05/20 1520 03/05/20 1530   03/05/20 1315  vancomycin (VANCOCIN) 2,000 mg in sodium chloride 0.9 % 500 mL IVPB       "Followed by" Linked Group Details   2,000 mg 250 mL/hr over 120 Minutes Intravenous  Once 03/05/20 1303 03/05/20 1712   03/05/20 1245  vancomycin (VANCOCIN) IVPB 1000 mg/200 mL premix  Status:  Discontinued        1,000 mg 200 mL/hr over 60 Minutes Intravenous  Once 03/05/20 1244 03/05/20 1303   03/05/20 1245  cefTRIAXone (ROCEPHIN) 2 g in sodium chloride 0.9 % 100 mL IVPB        2 g 200 mL/hr over 30 Minutes Intravenous  Once 03/05/20 1244 03/05/20 1441       Medication:  . buPROPion  150 mg Oral Daily  . DULoxetine  60 mg Oral Daily  . ferrous gluconate  324 mg Oral Q breakfast  . gabapentin  300 mg Oral TID  . heparin  5,000 Units Subcutaneous Q8H  . insulin aspart  0-9 Units Subcutaneous TID WC  . insulin glargine  25 Units Subcutaneous Daily  . pantoprazole  40 mg Oral Daily  . topiramate  25 mg Oral QHS  . Vitamin D (Ergocalciferol)  50,000 Units Oral Weekly    acetaminophen, fluticasone, ibuprofen, oxyCODONE   Objective:   Vitals:   03/05/20 2015 03/05/20 2150 03/06/20 0321 03/06/20 0721  BP: 102/73 110/78 (!) 109/55 (!) 121/97  Pulse: 85 89 91 89  Resp: (!) '23 19 17 15  ' Temp:  97.8 F (36.6 C) 98.4 F (36.9 C) 97.9 F (36.6 C)  TempSrc:   Oral Oral Oral  SpO2: 98% 97% 98% 95%  Weight:      Height:        Intake/Output Summary (Last 24 hours) at 03/06/2020 1424 Last data filed at 03/06/2020 0900 Gross per 24 hour  Intake 2010 ml  Output --  Net 2010 ml   Filed Weights   03/05/20 0912  Weight: 103 kg     Examination:   Physical Exam  Constitution:  Alert, cooperative, no distress,  Appears calm  and comfortable  Psychiatric: Normal and stable mood and affect, cognition intact,   HEENT: Normocephalic, PERRL, otherwise with in Normal limits  Chest:Chest symmetric Cardio vascular:  S1/S2, RRR, No murmure, No Rubs or Gallops  pulmonary: Clear to auscultation bilaterally, respirations unlabored, negative wheezes / crackles Abdomen: Soft, non-tender, non-distended, bowel sounds,no masses, no organomegaly Muscular skeletal: Limited exam - in bed, able to move all 4 extremities, Normal strength,  Neuro: CNII-XII intact. , normal motor and sensation, reflexes intact  Extremities: No pitting edema lower extremities, +2 pulses  Skin: Dry, warm to touch, negative for any Rashes, left foot, fifth toe erythema edema Dressing in place Wounds: Left fifth metatarsal edema erythema drainage      ------------------------------------------------------------------------------------------------------------------------------------------    LABs:  CBC Latest Ref Rng & Units 03/06/2020 03/05/2020 03/04/2020  WBC 4.0 - 10.5 K/uL 8.6 10.3 10.8(H)  Hemoglobin 12.0 - 15.0 g/dL 8.9(L) 9.8(L) 9.7(L)  Hematocrit 36 - 46 % 28.6(L) 32.7(L) 30.4(L)  Platelets 150 - 400 K/uL 313 342 337   CMP Latest Ref Rng & Units 03/06/2020 03/05/2020 03/04/2020  Glucose 70 - 99 mg/dL 185(H) 119(H) 160(H)  BUN 6 - 20 mg/dL 18 23(H) 28(H)  Creatinine 0.44 - 1.00 mg/dL 1.59(H) 1.40(H) 1.75(H)  Sodium 135 - 145 mmol/L 137 139 136  Potassium 3.5 - 5.1 mmol/L 5.4(H) 5.1 5.2(H)  Chloride 98 - 111 mmol/L 105 105 103  CO2 22 - 32 mmol/L '22 23 22  ' Calcium 8.9 -  10.3 mg/dL 8.0(L) 9.0 8.7(L)  Total Protein 6.5 - 8.1 g/dL - 6.8 6.6  Total Bilirubin 0.3 - 1.2 mg/dL - 0.4 0.4  Alkaline Phos 38 - 126 U/L - 80 72  AST 15 - 41 U/L - 17 16  ALT 0 - 44 U/L - 15 13       Micro Results Recent Results (from the past 240 hour(s))  Culture, blood (routine x 2)     Status: None (Preliminary result)   Collection Time: 03/05/20  1:10 PM   Specimen: BLOOD  Result Value Ref Range Status   Specimen Description BLOOD LEFT ANTECUBITAL  Final   Special Requests   Final    BOTTLES DRAWN AEROBIC AND ANAEROBIC Blood Culture results may not be optimal due to an inadequate volume of blood received in culture bottles   Culture   Final    NO GROWTH < 24 HOURS Performed at [facility], [place] [address] [place], [place] [address]    Report Status PENDING  Incomplete  Culture, blood (routine x 2)     Status: None (Preliminary result)   Collection Time: 03/05/20  1:36 PM   Specimen: BLOOD  Result Value Ref Range Status   Specimen Description BLOOD RIGHT ANTECUBITAL  Final   Special Requests   Final    BOTTLES DRAWN AEROBIC AND ANAEROBIC Blood Culture results may not be optimal due to an inadequate volume of blood received in culture bottles   Culture   Final    NO GROWTH < 24 HOURS Performed at [facility], [place] [address] [place],  [address]    Report Status PENDING  Incomplete  SARS Coronavirus 2 by RT PCR (hospital order, performed in [facility] hospital lab) Nasopharyngeal Nasopharyngeal Swab     Status: None   Collection Time: 03/05/20  2:11 PM   Specimen: Nasopharyngeal Swab  Result Value Ref Range Status   SARS Coronavirus 2 NEGATIVE NEGATIVE Final    Comment: (NOTE) SARS-CoV-2 target nucleic acids are  NOT DETECTED.  The SARS-CoV-2 RNA is generally detectable in upper and lower respiratory specimens during the acute phase of infection. The lowest concentration of SARS-CoV-2 viral copies this assay can detect is 250 copies / mL. A  negative result does not preclude SARS-CoV-2 infection and should not be used as the sole basis for treatment or other patient management decisions.  A negative result may occur with improper specimen collection / handling, submission of specimen other than nasopharyngeal swab, presence of viral mutation(s) within the areas targeted by this assay, and inadequate number of viral copies (<250 copies / mL). A negative result must be combined with clinical observations, patient history, and epidemiological information.  Fact Sheet for Patients:   StrictlyIdeas.no  Fact Sheet for Healthcare Providers: BankingDealers.co.za  This test is not yet approved or  cleared by the [place] FDA and has been authorized for detection and/or diagnosis of SARS-CoV-2 by FDA under an Emergency Use Authorization (EUA).  This EUA will remain in effect (meaning this test can be used) for the duration of the COVID-19 declaration under Section 564(b)(1) of the Act, 21 U.S.C. section 360bbb-3(b)(1), unless the authorization is terminated or revoked sooner.  Performed at [facility], [place] [address], [place], [place] [address]     Radiology Reports MR FOOT LEFT WO CONTRAST  Result Date: 03/06/2020 CLINICAL DATA:  Fifth toe infection. EXAM: MRI OF THE LEFT FOOT WITHOUT CONTRAST TECHNIQUE: Multiplanar, multisequence MR imaging of the left forefoot was performed. No intravenous contrast was administered. COMPARISON:  Left foot x-rays from yesterday. FINDINGS: Bones/Joint/Cartilage Abnormal marrow edema with corresponding decreased T1 marrow signal involving the fifth proximal phalanx head and shaft as well as the fifth middle phalanx. No fracture or dislocation. Small first MTP joint effusion. Ligaments Collateral ligaments are intact. Muscles and Tendons Intact. No tenosynovitis. Increased T2 signal within the intrinsic muscles of the forefoot, nonspecific,  but likely related to diabetic muscle changes. Soft tissue Skin ulcerations along the dorsal and lateral aspects of the fifth toe. Prominent skin thickening and soft tissue swelling of the fifth toe with phlegmon surrounding fifth PIP joint tiny 7 x 4 mm fluid collection along the dorsal base of the fifth proximal phalanx (series 8, image 24). There is a 7 x 5 x 6 mm oval T2 hyperintense, T1 hypointense lesion along the medial aspect of the third distal phalanx (series 6, image 15). IMPRESSION: 1. Skin ulcerations along the dorsal and lateral aspects of the fifth toe with associated soft tissue infection, osteomyelitis of the fifth proximal and middle phalanges, and early abscess formation. 2. Non-aggressive appearing but nonspecific 7 mm soft tissue mass along the medial aspect of the third distal phalanx, incompletely characterized without intravenous contrast. Short-term follow-up study with and without contrast is recommended to determine if the lesion is cystic or solid. Excisional biopsy suggested if definitive diagnosis is desired. Electronically Signed   By: Titus Dubin M.D.   On: 03/06/2020 11:09   DG Foot Complete Left  Result Date: 03/05/2020 CLINICAL DATA:  Fifth toe infection. EXAM: LEFT FOOT - COMPLETE 3+ VIEW COMPARISON:  Left fifth toe x-rays from yesterday. FINDINGS: Unchanged cortical irregularity along the lateral head of the fifth proximal phalanx. No acute fracture or dislocation. Joint spaces are preserved. Bone mineralization is normal. Soft tissue swelling of the forefoot and fifth toe. IMPRESSION: 1. Unchanged cortical irregularity along the lateral head of the fifth proximal phalanx, suspicious for osteomyelitis. Electronically Signed   By: Orville Govern.D.  On: 03/05/2020 13:32   DG Toe 5th Left  Result Date: 03/04/2020 CLINICAL DATA:  Soft tissue ulcer rule out osteomyelitis. EXAM: DG TOE 5TH LEFT COMPARISON:  None. FINDINGS: Negative for fracture. No cortical erosion or  osteomyelitis. No significant degenerative change Soft tissue swelling and wound noted. IMPRESSION: Negative for fracture or osteomyelitis. Electronically Signed   By: Franchot Gallo M.D.   On: 03/04/2020 13:07    SIGNED: Deatra James, MD, FACP, FHM. [facility],  Pager (please use [email or web address] to [email or web address])  If 7PM-7AM, please contact night-coverage [email or web address] 03/06/2020, 2:24 PM

## 2020-03-07 DIAGNOSIS — N179 Acute kidney failure, unspecified: Secondary | ICD-10-CM | POA: Diagnosis present

## 2020-03-07 DIAGNOSIS — A419 Sepsis, unspecified organism: Secondary | ICD-10-CM | POA: Diagnosis present

## 2020-03-07 LAB — BASIC METABOLIC PANEL
Anion gap: 11 (ref 5–15)
BUN: 12 mg/dL (ref 6–20)
CO2: 21 mmol/L — ABNORMAL LOW (ref 22–32)
Calcium: 8.3 mg/dL — ABNORMAL LOW (ref 8.9–10.3)
Chloride: 105 mmol/L (ref 98–111)
Creatinine, Ser: 1.27 mg/dL — ABNORMAL HIGH (ref 0.44–1.00)
GFR calc Af Amer: >60 mL/min (ref >60)
GFR calc non Af Amer: 54 mL/min — ABNORMAL LOW (ref >60)
Glucose, Bld: 128 mg/dL — ABNORMAL HIGH (ref 70–99)
Potassium: 4.6 mmol/L (ref 3.5–5.1)
Sodium: 137 mmol/L (ref 135–145)

## 2020-03-07 LAB — GLUCOSE, CAPILLARY
Glucose-Capillary: 113 mg/dL — ABNORMAL HIGH (ref 70–99)
Glucose-Capillary: 145 mg/dL — ABNORMAL HIGH (ref 70–99)
Glucose-Capillary: 151 mg/dL — ABNORMAL HIGH (ref 70–99)

## 2020-03-07 LAB — MRSA PCR SCREENING: MRSA by PCR: NEGATIVE

## 2020-03-07 MED ORDER — VANCOMYCIN HCL 1750 MG/350ML IV SOLN
1750.0000 mg | INTRAVENOUS | Status: DC
  Administered 2020-03-07: 1750 mg via INTRAVENOUS
  Filled 2020-03-07 (×2): qty 350

## 2020-03-07 NOTE — Plan of Care (Signed)
  Problem: Education: Goal: Knowledge of General Education information will improve Description: Including pain rating scale, medication(s)/side effects and non-pharmacologic comfort measures Outcome: Progressing   Problem: Clinical Measurements: Goal: Ability to maintain clinical measurements within normal limits will improve Outcome: Progressing Goal: Diagnostic test results will improve Outcome: Progressing   Problem: Pain Managment: Goal: General experience of comfort will improve Outcome: Progressing   Problem: Elimination: Goal: Will not experience complications related to bowel motility Outcome: Progressing Goal: Will not experience complications related to urinary retention Outcome: Progressing

## 2020-03-07 NOTE — Progress Notes (Signed)
Pharmacy Antibiotic Note  Maria Morris is a 39 y.o. female admitted on 03/05/2020 with cellulitis > osteomyelitis of L 5th toe per MRI.  Also covering for sepsis. Pharmacy has been consulted for Vancomycin dosing.      Vancomycin dosed on 227 lbs (103 kg) on 7/14 but actual weight is 327 lbs (148 kg).  Loading dose given 7/14 and Vanc 1250 mg IV on 7/15. Low dose for updated weight, but dose not increased on 7/15 due to creatinine trended up.  Creatinine improved today. Discussed with Dr. Flossie Dibble.  Plan:  Increase Vancomycin to 1750 mg IV q24h.  Target troughs 15-20 mcg/ml  Continue Zosyn 3.375 gm IV q8hrs (each over 4 hours)  Noted plan for toe amputation on 7/16  Follow renal function, culture data, clinical progress and antibiotic plans.    Height: 5\' 7"  (170.2 cm) Weight: (!) 148.3 kg (327 lb) IBW/kg (Calculated) : 61.6  Temp (24hrs), Avg:98 F (36.7 C), Min:97.6 F (36.4 C), Max:98.4 F (36.9 C)  Recent Labs  Lab 03/04/20 1253 03/05/20 0916 03/05/20 1326 03/05/20 1328 03/06/20 0614 03/07/20 0347  WBC 10.8*  --   --  10.3 8.6  --   CREATININE 1.75*  --   --  1.40* 1.59* 1.27*  LATICACIDVEN 2.0* 2.2* 1.9  --   --   --     Estimated Creatinine Clearance: 92.2 mL/min (A) (by C-G formula based on SCr of 1.27 mg/dL (H)).    No Known Allergies  Antimicrobials this admission: Ceftriaxone x 1 on 7/14 Zosyn 7/14 >> Vancomycin 7/14 >>  Dose adjustments this admission:   7/16: increase empiric Vanc dose 1250 > 1750 mg IV q24h  Microbiology results:  7/14 blood x 2: no growth < 24 hrs to date  7/14 COVID: negative  Thank you for allowing pharmacy to be a part of this patient's care.  8/14, Dennie Fetters Phone: [phone] 03/07/2020 1:04 PM

## 2020-03-07 NOTE — Telephone Encounter (Signed)
Pt called requesting more information from Dr. Marylene Land regarding her plan of treatment and possible surgery. Please give patient a call.

## 2020-03-07 NOTE — Consult Note (Signed)
Podiatry progress note  Subjective: 39 year old diabetic female patient seen at bedside this morning for follow-up evaluation and discussion of MRI results. Patient is sitting bedside and denies any overnight events.  Patient reports that she cannot sleep that well due to worry.  No other issues noted.  Patient Active Problem List   Diagnosis Date Noted  . DM II (diabetes mellitus, type II), controlled ([facility]) 03/06/2020  . Osteomyelitis ([facility]) 03/05/2020  . Well woman exam with routine gynecological exam 07/26/2019  . Breast pain, left 07/26/2019    Current Facility-Administered Medications:  .  acetaminophen (TYLENOL) tablet 650 mg, 650 mg, Oral, TID PRN, Emeline General, MD, 650 mg at 03/06/20 1144 .  buPROPion (WELLBUTRIN XL) 24 hr tablet 150 mg, 150 mg, Oral, Daily, Mikey College T, MD, 150 mg at 03/06/20 0902 .  DULoxetine (CYMBALTA) DR capsule 60 mg, 60 mg, Oral, Daily, Mikey College T, MD, 60 mg at 03/06/20 0902 .  ferrous gluconate (FERGON) tablet 324 mg, 324 mg, Oral, Q breakfast, Mikey College T, MD, 324 mg at 03/06/20 0902 .  fluticasone (FLONASE) 50 MCG/ACT nasal spray 1 spray, 1 spray, Each Nare, Daily PRN, Mikey College T, MD .  gabapentin (NEURONTIN) capsule 300 mg, 300 mg, Oral, TID, Mikey College T, MD, 300 mg at 03/06/20 2152 .  heparin injection 5,000 Units, 5,000 Units, Subcutaneous, Q8H, Emeline General, MD, 5,000 Units at 03/07/20 0606 .  ibuprofen (ADVIL) tablet 800 mg, 800 mg, Oral, Q8H PRN, Mikey College T, MD, 800 mg at 03/07/20 0553 .  insulin aspart (novoLOG) injection 0-9 Units, 0-9 Units, Subcutaneous, TID WC, Emeline General, MD, 2 Units at 03/06/20 1221 .  insulin glargine (LANTUS) injection 25 Units, 25 Units, Subcutaneous, Daily, Emeline General, MD, 25 Units at 03/06/20 2202 .  oxyCODONE (Oxy IR/ROXICODONE) immediate release tablet 5 mg, 5 mg, Oral, Q6H PRN, Mikey College T, MD, 5 mg at 03/07/20 0553 .  pantoprazole (PROTONIX) EC tablet 40 mg, 40 mg, Oral, Daily, Mikey College T, MD,  40 mg at 03/06/20 0902 .  [COMPLETED] piperacillin-tazobactam (ZOSYN) IVPB 3.375 g, 3.375 g, Intravenous, Once, Stopped at 03/05/20 1748 **FOLLOWED BY** piperacillin-tazobactam (ZOSYN) IVPB 3.375 g, 3.375 g, Intravenous, Q8H, Joaquim Lai, RPH, Last Rate: 12.5 mL/hr at 03/07/20 0556, 3.375 g at 03/07/20 0556 .  topiramate (TOPAMAX) tablet 25 mg, 25 mg, Oral, QHS, Mikey College T, MD, 25 mg at 03/06/20 2152 .  vancomycin (VANCOREADY) IVPB 1250 mg/250 mL, 1,250 mg, Intravenous, Q24H, Scarlett Presto, [facility] .  Vitamin D (Ergocalciferol) (DRISDOL) capsule 50,000 Units, 50,000 Units, Oral, Weekly, Zhang, Ilda Foil T, MD  No Known Allergies   Objective: Blood pressure 130/81, pulse 99, temperature 97.8 F (36.6 C), temperature source Oral, resp. rate 18, height 5\' 7"  (1.702 m), weight (!) 148.3 kg, SpO2 98 %.  General no acute distress, sitting bedside  Focused lower extremity exam Dressing to the left foot clean dry and intact.  MRI IMPRESSION: 1. Skin ulcerations along the dorsal and lateral aspects of the fifth toe with associated soft tissue infection, osteomyelitis of the fifth proximal and middle phalanges, and early abscess formation. 2. Non-aggressive appearing but nonspecific 7 mm soft tissue mass along the medial aspect of the third distal phalanx, incompletely characterized without intravenous contrast. Short-term follow-up study with and without contrast is recommended to determine if the lesion is cystic or solid. Excisional biopsy suggested if definitive diagnosis is desired.  Assessment and plan: Problem List Items Addressed This Visit  None    Visit Diagnoses    Osteomyelitis of left foot, unspecified type (HCC)    -  Primary   Relevant Medications   sulfamethoxazole-trimethoprim (BACTRIM DS) 800-160 MG tablet   cefTRIAXone (ROCEPHIN) 2 g in sodium chloride 0.9 % 100 mL IVPB (Completed)   vancomycin (VANCOCIN) 2,000 mg in sodium chloride 0.9 % 500 mL IVPB (Completed)    vancomycin (VANCOREADY) IVPB 1250 mg/250 mL (Start on 03/07/2020  4:00 PM)     Patient seen and evaluated MRI results reviewed Discussed with patient treatment options for osteomyelitis at the fifth toe that is of current concern;Patient opt for surgical management. Consent obtained for left fifth toe amputation. Pre and Post op course explained. Risks, benefits, alternatives explained. No guarantees given or implied.  Case request submitted plan for OR tomorrow morning Continue with broad-spectrum antibiotics Continue with local wound care with Xeroform and dry dressing to the area until time for surgery  Patient may weight-bear for bathroom and out of bed to chair with assistance and with use of open toe surgical shoe which she has bedside like previous Continue PRN meds for pain  Podiatry to follow  Dr. Marylene Land [facility] [phone] office [phone] cell

## 2020-03-07 NOTE — Telephone Encounter (Signed)
Patient is in the hospital when I make my rounds I can talk to her more about this like I did this morning when I saw her.

## 2020-03-07 NOTE — Progress Notes (Signed)
PROGRESS NOTE    Patient: Maria Morris                            PCP: Antony Blackbird, MD                    DOB: 1980-12-27            DOA: 03/05/2020 JSH:[id number]             DOS: 03/07/2020, 10:48 AM   LOS: 2 days   Date of Service: The patient was seen and examined on 03/07/2020  Subjective:   The patient was seen and examined this morning, stable awake alert oriented no acute distress.  Hemodynamically stable No issues overnight. MRI confirmed osteomyelitis of the left fifth metatarsal Patient has been seen and evaluated Dr. Cannon Kettle planning amputation I&D of left fifth metatarsal in a.m. 03/08/2020    Brief Narrative:  Maria Morris is a 39 y.o. female with medical history significant of IDDM, diabetic neuropathy, HTN, anxiety depression, presented with worsening of left foot infection.  Patient first developed a left toe cellulitis in April this year.  She went to see her PCP, and x-ray was done showed soft tissue infection, and patient was placed on 7 days course of doxycycline.  Swelling decreased but the wound was healed.  Patient went back to PCP but 1 week ago with worsening of swelling and recurrent discharge from the left fifth toe wound  Patient left the ED AMA but was called back due to sepsis finding, elevated lactic acidosis, x-ray was consistent with left fifth metatarsal head osteomyelitis. Patient was subsequently admitted, started on broad-spectrum antibiotics Podiatrist Dr. Cannon Kettle was consulted  Assessment & Plan:   Principal Problem:   Osteomyelitis ([place]) Active Problems:   DM II (diabetes mellitus, type II), controlled ([place])   Osteomyelitis-left fifth head toe metatarsal osteomyelitis -X-ray of the foot, MRI of the left foot confirmed changes consistent with osteomyelitis of the left fifth toe proximal middle phalanges and early abscess formation. -Podiatrist Dr. Terrance Mass for amputation, I&D on 03/08/2020  -We will continue current IV  antibiotic of Zosyn and vancomycin -Patient likely needs further I&D versus amputation   Sepsis due to osteomyelitis Much improved, afebrile normotensive Patient met the criteria for SIRS/sepsis due to lactic acidosis, AKI, hypotension Mild tachycardia -Lactic acid 2.0, 2.2  >> 1.9 >>  -Responded well to IV fluid resuscitation, IV antibiotics -We will follow the blood cultures accordingly -We will continue monitor closely Currently afebrile normotensive  Acute renal insufficiency ?  CKD -Creatinine 1.75, 1.40, 1.59 >>> 1.27 today -Contacted pharmacy regarding nephrotoxins including vancomycin dose adjustment -Titrating vancomycin to renal dose, consulting pharmacy  Diabetes mellitus typeII -uncontrolled -Mild hyperglycemia likely due to infection -Resuming Lantus, -A1c 7.3 -Checking CBG QA CHS, with SSI coverage  History of hypertension -Mildly hypotensive due to sepsis -We will holding home medication  Nutritional status:         Cultures; Blood Cultures x 2 >> NGT -Pending wound culture left fifth metatarsal  Antimicrobials: 03/05/2020 IV vancomycin >> 03/05/2020 IV Zosyn >>    Consultants:  Dr. Cannon Kettle  ---------------------------------------------------------------------------------------------------------------------------------  DVT prophylaxis:  Heparin subcu  Code Status:   Code Status: Full Code Family Communication: No family member present at bedside- The above findings and plan of care has been discussed with patient in detail,  they expressed understanding and agreement of above. -Advance care planning has been discussed.  Admission status:    Status is: Inpatient  Remains inpatient appropriate because:Inpatient level of care appropriate due to severity of illness   Dispo: The patient is from: Home              Anticipated d/c is to: Home              Anticipated d/c date is: 3 days              Patient currently is not medically stable  to d/c.        Procedures:   No admission procedures for hospital encounter.     Antimicrobials:  Anti-infectives (From admission, onward)   Start     Dose/Rate Route Frequency Ordered Stop   03/07/20 1600  vancomycin (VANCOREADY) IVPB 1750 mg/350 mL  Status:  Discontinued       "Followed by" Linked Group Details   1,750 mg 175 mL/hr over 120 Minutes Intravenous Every 24 hours 03/06/20 1649 03/06/20 1711   03/07/20 1600  vancomycin (VANCOREADY) IVPB 1250 mg/250 mL     Discontinue     1,250 mg 166.7 mL/hr over 90 Minutes Intravenous Every 24 hours 03/06/20 1711     03/06/20 1730  vancomycin (VANCOREADY) IVPB 500 mg/100 mL  Status:  Discontinued        500 mg 100 mL/hr over 60 Minutes Intravenous  Once 03/06/20 1649 03/06/20 1710   03/06/20 1400  Vancomycin (VANCOCIN) 1,250 mg in sodium chloride 0.9 % 250 mL IVPB  Status:  Discontinued       "Followed by" Linked Group Details   1,250 mg 166.7 mL/hr over 90 Minutes Intravenous Every 24 hours 03/05/20 1303 03/06/20 1649   03/05/20 2200  piperacillin-tazobactam (ZOSYN) IVPB 3.375 g     Discontinue    "Followed by" Linked Group Details   3.375 g 12.5 mL/hr over 240 Minutes Intravenous Every 8 hours 03/05/20 1530     03/05/20 1545  piperacillin-tazobactam (ZOSYN) IVPB 3.375 g       "Followed by" Linked Group Details   3.375 g 100 mL/hr over 30 Minutes Intravenous  Once 03/05/20 1530 03/05/20 1748   03/05/20 1530  piperacillin-tazobactam (ZOSYN) IVPB 3.375 g  Status:  Discontinued        3.375 g 100 mL/hr over 30 Minutes Intravenous  Once 03/05/20 1520 03/05/20 1530   03/05/20 1315  vancomycin (VANCOCIN) 2,000 mg in sodium chloride 0.9 % 500 mL IVPB       "Followed by" Linked Group Details   2,000 mg 250 mL/hr over 120 Minutes Intravenous  Once 03/05/20 1303 03/05/20 1712   03/05/20 1245  vancomycin (VANCOCIN) IVPB 1000 mg/200 mL premix  Status:  Discontinued        1,000 mg 200 mL/hr over 60 Minutes Intravenous  Once  03/05/20 1244 03/05/20 1303   03/05/20 1245  cefTRIAXone (ROCEPHIN) 2 g in sodium chloride 0.9 % 100 mL IVPB        2 g 200 mL/hr over 30 Minutes Intravenous  Once 03/05/20 1244 03/05/20 1441       Medication:  . buPROPion  150 mg Oral Daily  . DULoxetine  60 mg Oral Daily  . ferrous gluconate  324 mg Oral Q breakfast  . gabapentin  300 mg Oral TID  . heparin  5,000 Units Subcutaneous Q8H  . insulin aspart  0-9 Units Subcutaneous TID WC  . insulin glargine  25 Units Subcutaneous Daily  . pantoprazole  40 mg Oral Daily  .  topiramate  25 mg Oral QHS  . Vitamin D (Ergocalciferol)  50,000 Units Oral Weekly    acetaminophen, fluticasone, ibuprofen, oxyCODONE   Objective:   Vitals:   03/06/20 1600 03/06/20 1943 03/07/20 0413 03/07/20 0730  BP:  (!) 147/80 130/81 (!) 130/99  Pulse:  94 99 91  Resp:  _0 Temp:  98.4 F (36.9 C) 97.8 F (36.6 C) 97.6 F (36.4 C)  TempSrc:  Oral Oral Oral  SpO2:  100% 98% 92%  Weight: (!) 148.3 kg     Height:        Intake/Output Summary (Last 24 hours) at 03/07/2020 1048 Last data filed at 03/06/2020 1700 Gross per 24 hour  Intake 720 ml  Output --  Net 720 ml   Filed Weights   03/05/20 0912 03/06/20 1600  Weight: 103 kg (!) 148.3 kg     Examination:    Physical Exam  Constitution:  Alert, cooperative, no distress,  Psychiatric: Normal and stable mood and affect, cognition intact,   HEENT: Normocephalic, PERRL, otherwise with in Normal limits  Chest:Chest symmetric Cardio vascular:  S1/S2, RRR, No murmure, No Rubs or Gallops  pulmonary: Clear to auscultation bilaterally, respirations unlabored, negative wheezes / crackles Abdomen: Soft, non-tender, non-distended, bowel sounds,no masses, no organomegaly Muscular skeletal: Limited exam - in bed, able to move all 4 extremities, Normal strength,  Neuro: CNII-XII intact. , normal motor and sensation, reflexes intact  Extremities: No pitting edema lower extremities, +2 pulses   Skin: Dry, warm to touch, negative for any Rashes, left foot erythema edema with a left fifth toe necrotic tissue with ulceration.. Wounds: Left fifth metatarsal edema erythema or drainage, dressing in place,      Wounds: Left fifth metatarsal edema erythema drainage      ------------------------------------------------------------------------------------------------------------------------------------------    LABs:  CBC Latest Ref Rng & Units 03/06/2020 03/05/2020 03/04/2020  WBC 4.0 - 10.5 K/uL 8.6 10.3 10.8(H)  Hemoglobin 12.0 - 15.0 g/dL 8.9(L) 9.8(L) 9.7(L)  Hematocrit 36 - 46 % 28.6(L) 32.7(L) 30.4(L)  Platelets 150 - 400 K/uL 313 342 337   CMP Latest Ref Rng & Units 03/07/2020 03/06/2020 03/05/2020  Glucose 70 - 99 mg/dL 128(H) 185(H) 119(H)  BUN 6 - 20 mg/dL 12 18 23(H)  Creatinine 0.44 - 1.00 mg/dL 1.27(H) 1.59(H) 1.40(H)  Sodium 135 - 145 mmol/L 137 137 139  Potassium 3.5 - 5.1 mmol/L 4.6 5.4(H) 5.1  Chloride 98 - 111 mmol/L 105 105 105  CO2 22 - 32 mmol/L 21(L) 22 23  Calcium 8.9 - 10.3 mg/dL 8.3(L) 8.0(L) 9.0  Total Protein 6.5 - 8.1 g/dL - - 6.8  Total Bilirubin 0.3 - 1.2 mg/dL - - 0.4  Alkaline Phos 38 - 126 U/L - - 80  AST 15 - 41 U/L - - 17  ALT 0 - 44 U/L - - 15       Micro Results Recent Results (from the past 240 hour(s))  Culture, blood (routine x 2)     Status: None (Preliminary result)   Collection Time: 03/05/20  1:10 PM   Specimen: BLOOD  Result Value Ref Range Status   Specimen Description BLOOD LEFT ANTECUBITAL  Final   Special Requests   Final    BOTTLES DRAWN AEROBIC AND ANAEROBIC Blood Culture results may not be optimal due to an inadequate volume of blood received in culture bottles   Culture   Final    NO GROWTH < 24 HOURS Performed at [facility], [address] [place], [place] [address]    Report Status PENDING  Incomplete  Culture, blood (routine x 2)     Status: None (Preliminary result)   Collection Time: 03/05/20   1:36 PM   Specimen: BLOOD  Result Value Ref Range Status   Specimen Description BLOOD RIGHT ANTECUBITAL  Final   Special Requests   Final    BOTTLES DRAWN AEROBIC AND ANAEROBIC Blood Culture results may not be optimal due to an inadequate volume of blood received in culture bottles   Culture   Final    NO GROWTH < 24 HOURS Performed at [facility], [place] [address], [place], [place] [address]    Report Status PENDING  Incomplete  SARS Coronavirus 2 by RT PCR (hospital order, performed in [facility] hospital lab) Nasopharyngeal Nasopharyngeal Swab     Status: None   Collection Time: 03/05/20  2:11 PM   Specimen: Nasopharyngeal Swab  Result Value Ref Range Status   SARS Coronavirus 2 NEGATIVE NEGATIVE Final    Comment: (NOTE) SARS-CoV-2 target nucleic acids are NOT DETECTED.  The SARS-CoV-2 RNA is generally detectable in upper and lower respiratory specimens during the acute phase of infection. The lowest concentration of SARS-CoV-2 viral copies this assay can detect is 250 copies / mL. A negative result does not preclude SARS-CoV-2 infection and should not be used as the sole basis for treatment or other patient management decisions.  A negative result may occur with improper specimen collection / handling, submission of specimen other than nasopharyngeal swab, presence of viral mutation(s) within the areas targeted by this assay, and inadequate number of viral copies (<250 copies / mL). A negative result must be combined with clinical observations, patient history, and epidemiological information.  Fact Sheet for Patients:   StrictlyIdeas.no  Fact Sheet for Healthcare Providers: BankingDealers.co.za  This test is not yet approved or  cleared by the [place] FDA and has been authorized for detection and/or diagnosis of SARS-CoV-2 by FDA under an Emergency Use Authorization (EUA).  This EUA will remain in effect (meaning  this test can be used) for the duration of the COVID-19 declaration under Section 564(b)(1) of the Act, 21 U.S.C. section 360bbb-3(b)(1), unless the authorization is terminated or revoked sooner.  Performed at [facility], [place] [address], [place],  [address]     Radiology Reports MR FOOT LEFT WO CONTRAST  Result Date: 03/06/2020 CLINICAL DATA:  Fifth toe infection. EXAM: MRI OF THE LEFT FOOT WITHOUT CONTRAST TECHNIQUE: Multiplanar, multisequence MR imaging of the left forefoot was performed. No intravenous contrast was administered. COMPARISON:  Left foot x-rays from yesterday. FINDINGS: Bones/Joint/Cartilage Abnormal marrow edema with corresponding decreased T1 marrow signal involving the fifth proximal phalanx head and shaft as well as the fifth middle phalanx. No fracture or dislocation. Small first MTP joint effusion. Ligaments Collateral ligaments are intact. Muscles and Tendons Intact. No tenosynovitis. Increased T2 signal within the intrinsic muscles of the forefoot, nonspecific, but likely related to diabetic muscle changes. Soft tissue Skin ulcerations along the dorsal and lateral aspects of the fifth toe. Prominent skin thickening and soft tissue swelling of the fifth toe with phlegmon surrounding fifth PIP joint tiny 7 x 4 mm fluid collection along the dorsal base of the fifth proximal phalanx (series 8, image 24). There is a 7 x 5 x 6 mm oval T2 hyperintense, T1 hypointense lesion along the medial aspect of the third distal phalanx (series 6, image 15). IMPRESSION: 1. Skin ulcerations  along the dorsal and lateral aspects of the fifth toe with associated soft tissue infection, osteomyelitis of the fifth proximal and middle phalanges, and early abscess formation. 2. Non-aggressive appearing but nonspecific 7 mm soft tissue mass along the medial aspect of the third distal phalanx, incompletely characterized without intravenous contrast. Short-term follow-up study with and without  contrast is recommended to determine if the lesion is cystic or solid. Excisional biopsy suggested if definitive diagnosis is desired. Electronically Signed   By: Titus Dubin M.D.   On: 03/06/2020 11:09   DG Foot Complete Left  Result Date: 03/05/2020 CLINICAL DATA:  Fifth toe infection. EXAM: LEFT FOOT - COMPLETE 3+ VIEW COMPARISON:  Left fifth toe x-rays from yesterday. FINDINGS: Unchanged cortical irregularity along the lateral head of the fifth proximal phalanx. No acute fracture or dislocation. Joint spaces are preserved. Bone mineralization is normal. Soft tissue swelling of the forefoot and fifth toe. IMPRESSION: 1. Unchanged cortical irregularity along the lateral head of the fifth proximal phalanx, suspicious for osteomyelitis. Electronically Signed   By: Titus Dubin M.D.   On: 03/05/2020 13:32   DG Toe 5th Left  Result Date: 03/04/2020 CLINICAL DATA:  Soft tissue ulcer rule out osteomyelitis. EXAM: DG TOE 5TH LEFT COMPARISON:  None. FINDINGS: Negative for fracture. No cortical erosion or osteomyelitis. No significant degenerative change Soft tissue swelling and wound noted. IMPRESSION: Negative for fracture or osteomyelitis. Electronically Signed   By: Franchot Gallo M.D.   On: 03/04/2020 13:07    SIGNED: Deatra James, MD, FACP, FHM. [facility],  Pager (please use [email or web address] to [email or web address])  If 7PM-7AM, please contact night-coverage [email or web address] Ota [facility] 03/07/2020, 10:48 AM

## 2020-03-08 ENCOUNTER — Encounter (HOSPITAL_COMMUNITY)
Admission: EM | Disposition: A | Discharge status: Home / Self Care | Payer: Self-pay | Source: Home / Self Care | Attending: Family Medicine

## 2020-03-08 ENCOUNTER — Inpatient Hospital Stay (HOSPITAL_COMMUNITY): Payer: 59 | Admitting: Certified Registered Nurse Anesthetist

## 2020-03-08 ENCOUNTER — Other Ambulatory Visit: Payer: Self-pay | Admitting: Sports Medicine

## 2020-03-08 DIAGNOSIS — N171 Acute kidney failure with acute cortical necrosis: Secondary | ICD-10-CM

## 2020-03-08 DIAGNOSIS — M86679 Other chronic osteomyelitis, unspecified ankle and foot: Secondary | ICD-10-CM

## 2020-03-08 DIAGNOSIS — R6520 Severe sepsis without septic shock: Secondary | ICD-10-CM

## 2020-03-08 DIAGNOSIS — A419 Sepsis, unspecified organism: Principal | ICD-10-CM

## 2020-03-08 HISTORY — PX: AMPUTATION TOE: SHX6595

## 2020-03-08 LAB — GLUCOSE, CAPILLARY
Glucose-Capillary: 149 mg/dL — ABNORMAL HIGH (ref 70–99)
Glucose-Capillary: 123 mg/dL — ABNORMAL HIGH (ref 70–99)
Glucose-Capillary: 120 mg/dL — ABNORMAL HIGH (ref 70–99)
Glucose-Capillary: 131 mg/dL — ABNORMAL HIGH (ref 70–99)

## 2020-03-08 SURGERY — AMPUTATION, TOE
Anesthesia: Monitor Anesthesia Care | Site: Toe | Laterality: Left

## 2020-03-08 MED ORDER — 0.9 % SODIUM CHLORIDE (POUR BTL) OPTIME
TOPICAL | Status: DC | PRN
  Administered 2020-03-08: 1000 mL

## 2020-03-08 MED ORDER — BUPIVACAINE HCL 0.25 % IJ SOLN
INTRAMUSCULAR | Status: DC | PRN
  Administered 2020-03-08 (×2): 5 mL

## 2020-03-08 MED ORDER — PROMETHAZINE HCL 25 MG/ML IJ SOLN: 6.2500 mg | INTRAMUSCULAR | Status: DC | PRN

## 2020-03-08 MED ORDER — CHLORHEXIDINE GLUCONATE 0.12 % MT SOLN
15.0000 mL | Freq: Once | OROMUCOSAL | Status: AC
  Administered 2020-03-08: 15 mL via OROMUCOSAL

## 2020-03-08 MED ORDER — ONDANSETRON HCL 4 MG/2ML IJ SOLN
INTRAMUSCULAR | Status: AC
  Filled 2020-03-08: qty 2

## 2020-03-08 MED ORDER — HYDROMORPHONE HCL 1 MG/ML IJ SOLN: 0.2500 mg | INTRAMUSCULAR | Status: DC | PRN

## 2020-03-08 MED ORDER — FENTANYL CITRATE (PF) 100 MCG/2ML IJ SOLN
INTRAMUSCULAR | Status: DC | PRN
  Administered 2020-03-08: 50 ug via INTRAVENOUS
  Administered 2020-03-08: 100 ug via INTRAVENOUS

## 2020-03-08 MED ORDER — OXYCODONE HCL 5 MG PO TABS: 5.0000 mg | ORAL_TABLET | Freq: Once | ORAL | Status: DC | PRN

## 2020-03-08 MED ORDER — SULFAMETHOXAZOLE-TRIMETHOPRIM 400-80 MG PO TABS: 1.0000 | ORAL_TABLET | Freq: Two times a day (BID) | ORAL | 0 refills | Status: DC

## 2020-03-08 MED ORDER — PROPOFOL 10 MG/ML IV BOLUS
INTRAVENOUS | Status: DC | PRN
  Administered 2020-03-08: 20 mg via INTRAVENOUS
  Administered 2020-03-08: 50 mg via INTRAVENOUS

## 2020-03-08 MED ORDER — MIDAZOLAM HCL 2 MG/2ML IJ SOLN
INTRAMUSCULAR | Status: AC
  Filled 2020-03-08: qty 2

## 2020-03-08 MED ORDER — LIDOCAINE HCL (PF) 1 % IJ SOLN
INTRAMUSCULAR | Status: DC | PRN
  Administered 2020-03-08 (×2): 5 mL

## 2020-03-08 MED ORDER — FENTANYL CITRATE (PF) 250 MCG/5ML IJ SOLN
INTRAMUSCULAR | Status: AC
  Filled 2020-03-08: qty 5

## 2020-03-08 MED ORDER — ONDANSETRON HCL 4 MG/2ML IJ SOLN
INTRAMUSCULAR | Status: DC | PRN
  Administered 2020-03-08: 4 mg via INTRAVENOUS

## 2020-03-08 MED ORDER — BUPIVACAINE HCL (PF) 0.25 % IJ SOLN
INTRAMUSCULAR | Status: AC
  Filled 2020-03-08: qty 30

## 2020-03-08 MED ORDER — LACTATED RINGERS IV SOLN: INTRAVENOUS | Status: DC

## 2020-03-08 MED ORDER — MEPERIDINE HCL 25 MG/ML IJ SOLN: 6.2500 mg | INTRAMUSCULAR | Status: DC | PRN

## 2020-03-08 MED ORDER — PROPOFOL 500 MG/50ML IV EMUL
INTRAVENOUS | Status: DC | PRN
  Administered 2020-03-08: 100 ug/kg/min via INTRAVENOUS

## 2020-03-08 MED ORDER — HYDROCODONE-ACETAMINOPHEN 10-325 MG PO TABS: 1.0000 | ORAL_TABLET | Freq: Four times a day (QID) | ORAL | 0 refills | Status: AC | PRN

## 2020-03-08 MED ORDER — MIDAZOLAM HCL 5 MG/5ML IJ SOLN
INTRAMUSCULAR | Status: DC | PRN
  Administered 2020-03-08: 2 mg via INTRAVENOUS

## 2020-03-08 MED ORDER — OXYCODONE HCL 5 MG/5ML PO SOLN: 5.0000 mg | Freq: Once | ORAL | Status: DC | PRN

## 2020-03-08 MED ORDER — LIDOCAINE HCL (PF) 1 % IJ SOLN
INTRAMUSCULAR | Status: AC
  Filled 2020-03-08: qty 30

## 2020-03-08 SURGICAL SUPPLY — 34 items
BLADE SAW SGTL NAR THIN XSHT (BLADE) ×3 IMPLANT
BLADE SURG 10 STRL SS (BLADE) ×3 IMPLANT
BNDG COHESIVE 4X5 TAN STRL (GAUZE/BANDAGES/DRESSINGS) ×3 IMPLANT
BNDG ELASTIC 4X5.8 VLCR STR LF (GAUZE/BANDAGES/DRESSINGS) ×3 IMPLANT
BNDG ESMARK 4X9 LF (GAUZE/BANDAGES/DRESSINGS) ×3 IMPLANT
BNDG GAUZE ELAST 4 BULKY (GAUZE/BANDAGES/DRESSINGS) ×3 IMPLANT
COVER SURGICAL LIGHT HANDLE (MISCELLANEOUS) ×3 IMPLANT
CUFF TOURN SGL QUICK 18X4 (TOURNIQUET CUFF) ×3 IMPLANT
DRSG EMULSION OIL 3X3 NADH (GAUZE/BANDAGES/DRESSINGS) ×3 IMPLANT
DRSG PAD ABDOMINAL 8X10 ST (GAUZE/BANDAGES/DRESSINGS) ×3 IMPLANT
DURAPREP 26ML APPLICATOR (WOUND CARE) ×3 IMPLANT
GAUZE SPONGE 4X4 12PLY STRL (GAUZE/BANDAGES/DRESSINGS) ×3 IMPLANT
GAUZE SPONGE 4X4 12PLY STRL LF (GAUZE/BANDAGES/DRESSINGS) ×3 IMPLANT
GLOVE BIO SURGEON STRL SZ 6.5 (GLOVE) ×4 IMPLANT
GLOVE BIO SURGEONS STRL SZ 6.5 (GLOVE) ×2
GLOVE INDICATOR 6.5 STRL GRN (GLOVE) ×9 IMPLANT
GOWN STRL REUS W/ TWL LRG LVL3 (GOWN DISPOSABLE) ×2 IMPLANT
GOWN STRL REUS W/TWL LRG LVL3 (GOWN DISPOSABLE) ×4
KIT BASIN OR (CUSTOM PROCEDURE TRAY) ×3 IMPLANT
KIT TURNOVER KIT B (KITS) ×3 IMPLANT
NEEDLE HYPO 25GX1X1/2 BEV (NEEDLE) ×3 IMPLANT
NS IRRIG 1000ML POUR BTL (IV SOLUTION) ×3 IMPLANT
PACK ORTHO EXTREMITY (CUSTOM PROCEDURE TRAY) ×3 IMPLANT
PAD ABD 8X10 STRL (GAUZE/BANDAGES/DRESSINGS) ×3 IMPLANT
PAD ARMBOARD 7.5X6 YLW CONV (MISCELLANEOUS) ×6 IMPLANT
STAPLER VISISTAT 35W (STAPLE) ×3 IMPLANT
SUCTION FRAZIER HANDLE 10FR (MISCELLANEOUS) ×2
SUCTION TUBE FRAZIER 10FR DISP (MISCELLANEOUS) ×1 IMPLANT
SUT PROLENE 0 CT 1 30 (SUTURE) ×3 IMPLANT
SUT PROLENE 3 0 PS 2 (SUTURE) ×6 IMPLANT
SUT VIC AB 2-0 SH 27 (SUTURE) ×2
SUT VIC AB 2-0 SH 27XBRD (SUTURE) ×1 IMPLANT
TUBE CONNECTING 12'X1/4 (SUCTIONS) ×1
TUBE CONNECTING 12X1/4 (SUCTIONS) ×2 IMPLANT

## 2020-03-08 NOTE — Op Note (Signed)
DATE OF SURGERY: 03-08-20  PREOPERATIVE DIAGNOSIS: Left 5th toe osteomyelitis  POST OP DIAGNOSIS: Same  PROCEDURE PERFORMED: Left 5th toe amputation + distal met head removal  ANESTHESIA: MAC with Local   INDICATIONS FOR PROCEDURE:  This 39 y.o. _0 @  patient seen in hospital for osteomyelitis of toe. Patient was offered treatment options and elects surgery. The risks versus benefits of the procedure were discussed with the patient in detail by Dr. Cannon Kettle. The consent is available on the chart for review.  PROCEDURE IN DETAIL: After patient was taken to the operating room and placed on the operating table in the supine position, a safety strap was placed across the patient's waist.  Adequate IV sedation was administered by the Department of Anesthesia and a total of 10 cc of 1:1 mixture 1% lidocaine and 0.25% Marcaine plain were injected as a digital block to left 5th toe.  A tourniquet was placed but not yet inflated. The foot was prepped and draped in the usual aseptic fashion lowering the operative Field.  Attention was directed to the Left 5th toe where there was fibrotic tissue with exposed bone at dorsal and lateral toe wounds.  The tourniquet was inflated then a #15 blade was used to make an incision down the bone in a elliptical fashion encompassing the toe.  The incision was carried mediolaterally and plantarly encompassing the toe leaving a flap of plantar lateral skin intact.  Next, the toe was disarticulated at the metatarsophalangeal joint and removed. The toe was sent for bone culture and sensitivity to pathology.  There was a small amount of purulent drainage found.  The proximal margin of the surgical site tissue was denuded so the metatarsal head was also resected and sent to pathology. After removal of all nonviable soft tissue and bone , there was no malodor or any additional puss expressed..  Next, copious amounts of saline were instilled into the wound. A #2-0 Vicryl was used  to reapproximate the deep subcutaneous layer to release skin tension.  The flap was folded dorsally and  skin re-approximated using 3-0 and 0 prolene in simple suture technique with staples,  Iris scissors were used to modify and remodel the skin flap as needed.  An excellent cosmetic result was achieved. More local anesthetic was infiltrated around the surgical site. Tourniquet was deflated. The patient tolerated the above anesthesia and surgery without apparent complications.  A standard postoperative dressing was applied consisting of adaptic, 4x4s, ABD, Kerlix,coban and ACE wrap.  The patient was transported via cart to [facility] with vital signs able and vascular status intact to remaining digits of the left foot.    Patient is stable from podiatry point of view for discharge home later today on PO Bactrim and Norco for pain pending vital signs stable once back to medical floor. Patient is aware that my office will call her for her follow up appointment and that she should keep her dressing clean, dry, and intact and to limit weightbearing with post op shoe that she already has.   Landis Martins, DPM [phone] cell

## 2020-03-08 NOTE — Anesthesia Procedure Notes (Signed)
Procedure Name: MAC Date/Time: 03/08/2020 10:42 AM Performed by: Renato Shin, CRNA Pre-anesthesia Checklist: Patient identified, Emergency Drugs available, Suction available and Patient being monitored Patient Re-evaluated:Patient Re-evaluated prior to induction Oxygen Delivery Method: Simple face mask Preoxygenation: Pre-oxygenation with 100% oxygen Induction Type: IV induction Placement Confirmation: positive ETCO2 and breath sounds checked- equal and bilateral Dental Injury: Teeth and Oropharynx as per pre-operative assessment

## 2020-03-08 NOTE — Brief Op Note (Signed)
03/05/2020 - 03/08/2020  11:27 AM  PATIENT:  Maria Morris  39 y.o. female  PRE-OPERATIVE DIAGNOSIS:  osteomyelitis  POST-OPERATIVE DIAGNOSIS:  osteomyelitis  PROCEDURE:  Procedure(s): AMPUTATION 5th TOE (Left) + distal metatarsal   SURGEON:  Surgeon(s) and Role:    Landis Martins, DPM - Primary  PHYSICIAN ASSISTANT:   ASSISTANTS: None   ANESTHESIA:   MAC  EBL:  10 mL   BLOOD ADMINISTERED:none  DRAINS: none   LOCAL MEDICATIONS USED:  MARCAINE  AND LIDOCAINE  SPECIMEN:  Source of Specimen:  Left 5th toe + met head  DISPOSITION OF SPECIMEN:  PATHOLOGY  COUNTS:  YES  TOURNIQUET:   Total Tourniquet Time Documented: Calf (Left) - 33 minutes Total: Calf (Left) - 33 minutes   DICTATION: .Note written in EPIC  PLAN OF CARE: Transfer to medical floor with discharge later today to home pending vital signs stable with PO bactrim and Norco. Patient is aware limited weightbearing with post op shoe and to keep dressing clean, dry, and intact.  PATIENT DISPOSITION:  PACU - hemodynamically stable.   Delay start of Pharmacological VTE agent (>24hrs) due to surgical blood loss or risk of bleeding: No

## 2020-03-08 NOTE — Anesthesia Postprocedure Evaluation (Signed)
Anesthesia Post Note  Patient: Maria Morris  Procedure(s) Performed: AMPUTATION 5th TOE (Left Toe)     Patient location during evaluation: PACU Anesthesia Type: MAC Level of consciousness: awake and alert Pain management: pain level controlled Vital Signs Assessment: post-procedure vital signs reviewed and stable Respiratory status: spontaneous breathing Cardiovascular status: stable Anesthetic complications: no   No complications documented.  Last Vitals:  Vitals:   03/08/20 1150 03/08/20 1210  BP: 123/74 123/79  Pulse: 98 98  Resp: 18 20  Temp: 36.6 C 36.9 C  SpO2: 99% 96%    Last Pain:  Vitals:   03/08/20 1210  TempSrc: Oral  PainSc: 0-No pain                 Nolon Nations

## 2020-03-08 NOTE — Anesthesia Preprocedure Evaluation (Signed)
Anesthesia Evaluation  Patient identified by MRN, date of birth, ID band Patient awake    Reviewed: Allergy & Precautions, H&P , NPO status , Patient's Chart, lab work & pertinent test results  Airway Mallampati: II   Neck ROM: full    Dental  (+) Dental Advisory Given   Pulmonary former smoker,    breath sounds clear to auscultation       Cardiovascular hypertension, negative cardio ROS   Rhythm:regular Rate:Normal     Neuro/Psych  Headaches, PSYCHIATRIC DISORDERS Anxiety  Neuromuscular disease    GI/Hepatic GERD  ,  Endo/Other  diabetes, Type 2, Insulin DependentMorbid obesity  Renal/GU Renal disease     Musculoskeletal   Abdominal (+) + obese,   Peds  Hematology  (+) anemia ,   Anesthesia Other Findings   Reproductive/Obstetrics                             Anesthesia Physical  Anesthesia Plan  ASA: III  Anesthesia Plan: MAC   Post-op Pain Management:    Induction: Intravenous  PONV Risk Score and Plan: 3 and Ondansetron, Midazolam and Treatment may vary due to age or medical condition  Airway Management Planned: Natural Airway  Additional Equipment: None  Intra-op Plan:   Post-operative Plan:   Informed Consent: I have reviewed the patients History and Physical, chart, labs and discussed the procedure including the risks, benefits and alternatives for the proposed anesthesia with the patient or authorized representative who has indicated his/her understanding and acceptance.     Dental advisory given  Plan Discussed with: CRNA  Anesthesia Plan Comments:       Anesthesia Quick Evaluation

## 2020-03-08 NOTE — Transfer of Care (Signed)
Immediate Anesthesia Transfer of Care Note  Patient: Maria Morris  Procedure(s) Performed: AMPUTATION 5th TOE (Left Toe)  Patient Location: PACU  Anesthesia Type:MAC  Level of Consciousness: awake, alert  and patient cooperative  Airway & Oxygen Therapy: Patient Spontanous Breathing  Post-op Assessment: Report given to RN and Post -op Vital signs reviewed and stable  Post vital signs: Reviewed and stable  Last Vitals:  Vitals Value Taken Time  BP 132/74 03/08/20 1134  Temp    Pulse 106 03/08/20 1135  Resp 21 03/08/20 1135  SpO2 99 % 03/08/20 1135  Vitals shown include unvalidated device data.  Last Pain:  Vitals:   03/08/20 0939  TempSrc: Oral  PainSc:          Complications: No complications documented.

## 2020-03-08 NOTE — Progress Notes (Signed)
Sent Post op meds to her pharmacy -Dr. Kathie Rhodes

## 2020-03-08 NOTE — Discharge Summary (Signed)
Physician Discharge Summary  Cathryne Mancebo DQQ:[id number] DOB: 12/01/1980 DOA: 03/05/2020  PCP: Antony Blackbird, MD  Admit date: 03/05/2020 Discharge date: 03/08/2020  Admitted From: Home Disposition: Home  Recommendations for Outpatient Follow-up:  1. Follow up with PCP in 1-2 weeks 2. Please obtain BMP/CBC in one week 3. Follow-up with podiatry surgery as scheduled.  Their office will schedule follow-up.   Discharge Condition: Stable CODE STATUS: Full code Diet recommendation: Low-carb diet  Discharge summary: 39 year old female with history of IDDM, diabetic neuropathy, hypertension, anxiety depression, recent left foot infection failed outpatient therapy and antibiotic treatment presented with worsening pain, drainage and swelling of the foot mostly on the left fifth toe.  In the emergency room, found to have local wound infection and osteomyelitis of the fifth toe, resuscitated with IV fluids and admitted with antibiotics with surgical plan.  Patient was treated with broad-spectrum antibiotics with vancomycin and Zosyn.  Blood cultures negative.  Clinically improved.  Underwent MRI that showed extensive osteomyelitis of the fifth toe proximal and distal phalanx.  Patient underwent surgical amputation to the clean margin today.  Since patient underwent excision to the clean margin with closure of wound, surgery recommended discharge home on oral Bactrim.  Surgical cultures pending.  Patient is stable enough, she will go home with postop recommendations and instructions and follow-up with podiatry surgery. She had AKI on presentation that improved today.  Blood sugars are fairly stable and she will resume all her home regimen.  Blood pressures adequately responded and stabilized, able to go home on her medications.    Discharge Diagnoses:  Principal Problem:   Sepsis ([place]) Active Problems:   Osteomyelitis ([place])   DM II (diabetes mellitus, type II), controlled ([place])   AKI (acute  kidney injury) [facility])    Discharge Instructions  Discharge Instructions    Call MD for:  severe uncontrolled pain   Complete by: As directed    Call MD for:  temperature >100.4   Complete by: As directed    Diet - low sodium heart healthy   Complete by: As directed    Increase activity slowly   Complete by: As directed    Leave dressing on - Keep it clean, dry, and intact until clinic visit   Complete by: As directed      Allergies as of 03/08/2020   No Known Allergies     Medication List    STOP taking these medications   Allergy Relief 180 MG tablet Generic drug: fexofenadine   cyclobenzaprine 10 MG tablet Commonly known as: FLEXERIL   famotidine 20 MG tablet Commonly known as: PEPCID   fluticasone 50 MCG/ACT nasal spray Commonly known as: FLONASE   indomethacin 50 MG capsule Commonly known as: INDOCIN   meloxicam 15 MG tablet Commonly known as: MOBIC   oxyCODONE 5 MG immediate release tablet Commonly known as: Oxy IR/ROXICODONE   pregabalin 100 MG capsule Commonly known as: Lyrica   sertraline 25 MG tablet Commonly known as: ZOLOFT   sulfamethoxazole-trimethoprim 800-160 MG tablet Commonly known as: BACTRIM DS Replaced by: sulfamethoxazole-trimethoprim 400-80 MG tablet     TAKE these medications   acetaminophen 500 MG tablet Commonly known as: TYLENOL Take 1,000-2,000 mg by mouth 3 (three) times daily as needed for headache (migraine).   buPROPion 150 MG 24 hr tablet Commonly known as: WELLBUTRIN XL Take 150 mg by mouth daily.   DULoxetine 60 MG capsule Commonly known as: Cymbalta Take 1 capsule (60 mg total) by mouth daily.  ferrous gluconate 324 MG tablet Commonly known as: FERGON Take 324 mg by mouth daily with breakfast.   gabapentin 600 MG tablet Commonly known as: NEURONTIN Take 600 mg by mouth 3 (three) times daily. What changed: Another medication with the same name was removed. Continue taking this medication, and follow the  directions you see here.   glipiZIDE 10 MG tablet Commonly known as: GLUCOTROL Take 1 tablet (10 mg total) by mouth 2 (two) times daily before a meal.   HYDROcodone-acetaminophen 10-325 MG tablet Commonly known as: Norco Take 1 tablet by mouth every 6 (six) hours as needed for up to 7 days.   ibuprofen 800 MG tablet Commonly known as: ADVIL Take 1 tablet (800 mg total) by mouth every 8 (eight) hours as needed. What changed: reasons to take this   Lantus SoloStar 100 UNIT/ML Solostar Pen Generic drug: insulin glargine Inject 50 Units into the skin daily.   lisinopril 20 MG tablet Commonly known as: ZESTRIL Take 20 mg by mouth daily.   metFORMIN 500 MG tablet Commonly known as: GLUCOPHAGE Take 1 tablet (500 mg total) by mouth 2 (two) times daily with a meal.   pantoprazole 40 MG tablet Commonly known as: PROTONIX Take 1 tablet (40 mg total) by mouth daily.   sulfamethoxazole-trimethoprim 400-80 MG tablet Commonly known as: BACTRIM Take 1 tablet by mouth 2 (two) times daily. Replaces: sulfamethoxazole-trimethoprim 800-160 MG tablet   topiramate 25 MG tablet Commonly known as: TOPAMAX Take 25 mg by mouth at bedtime.   True Metrix Blood Glucose Test test strip Generic drug: glucose blood Use as instructed What changed:   how much to take  how to take this  when to take this  additional instructions   True Metrix Meter w/Device Kit 1 each by Does not apply route 2 (two) times a day.   TRUEplus Lancets 28G Misc 1 each by Does not apply route 2 (two) times a day.   TRUEplus Pen Needles 32G X 4 MM Misc Generic drug: Insulin Pen Needle Use as instructed. What changed:   how much to take  how to take this  when to take this  additional instructions   Trulicity 1.5 BU/3.8GT Sopn Generic drug: Dulaglutide Inject 1.5 mg into the skin once a week.   Vitamin D (Ergocalciferol) 1.25 MG (50000 UNIT) Caps capsule Commonly known as: DRISDOL Take 50,000 Units  by mouth once a week.            Discharge Care Instructions  (From admission, onward)         Start     Ordered   03/08/20 0000  Leave dressing on - Keep it clean, dry, and intact until clinic visit        03/08/20 1248          No Known Allergies  Consultations:  Podiatry surgery   Procedures/Studies: MR FOOT LEFT WO CONTRAST  Result Date: 03/06/2020 CLINICAL DATA:  Fifth toe infection. EXAM: MRI OF THE LEFT FOOT WITHOUT CONTRAST TECHNIQUE: Multiplanar, multisequence MR imaging of the left forefoot was performed. No intravenous contrast was administered. COMPARISON:  Left foot x-rays from yesterday. FINDINGS: Bones/Joint/Cartilage Abnormal marrow edema with corresponding decreased T1 marrow signal involving the fifth proximal phalanx head and shaft as well as the fifth middle phalanx. No fracture or dislocation. Small first MTP joint effusion. Ligaments Collateral ligaments are intact. Muscles and Tendons Intact. No tenosynovitis. Increased T2 signal within the intrinsic muscles of the forefoot, nonspecific, but likely related to  diabetic muscle changes. Soft tissue Skin ulcerations along the dorsal and lateral aspects of the fifth toe. Prominent skin thickening and soft tissue swelling of the fifth toe with phlegmon surrounding fifth PIP joint tiny 7 x 4 mm fluid collection along the dorsal base of the fifth proximal phalanx (series 8, image 24). There is a 7 x 5 x 6 mm oval T2 hyperintense, T1 hypointense lesion along the medial aspect of the third distal phalanx (series 6, image 15). IMPRESSION: 1. Skin ulcerations along the dorsal and lateral aspects of the fifth toe with associated soft tissue infection, osteomyelitis of the fifth proximal and middle phalanges, and early abscess formation. 2. Non-aggressive appearing but nonspecific 7 mm soft tissue mass along the medial aspect of the third distal phalanx, incompletely characterized without intravenous contrast. Short-term  follow-up study with and without contrast is recommended to determine if the lesion is cystic or solid. Excisional biopsy suggested if definitive diagnosis is desired. Electronically Signed   By: Titus Dubin M.D.   On: 03/06/2020 11:09   DG Foot Complete Left  Result Date: 03/05/2020 CLINICAL DATA:  Fifth toe infection. EXAM: LEFT FOOT - COMPLETE 3+ VIEW COMPARISON:  Left fifth toe x-rays from yesterday. FINDINGS: Unchanged cortical irregularity along the lateral head of the fifth proximal phalanx. No acute fracture or dislocation. Joint spaces are preserved. Bone mineralization is normal. Soft tissue swelling of the forefoot and fifth toe. IMPRESSION: 1. Unchanged cortical irregularity along the lateral head of the fifth proximal phalanx, suspicious for osteomyelitis. Electronically Signed   By: Titus Dubin M.D.   On: 03/05/2020 13:32   DG Toe 5th Left  Result Date: 03/04/2020 CLINICAL DATA:  Soft tissue ulcer rule out osteomyelitis. EXAM: DG TOE 5TH LEFT COMPARISON:  None. FINDINGS: Negative for fracture. No cortical erosion or osteomyelitis. No significant degenerative change Soft tissue swelling and wound noted. IMPRESSION: Negative for fracture or osteomyelitis. Electronically Signed   By: Franchot Gallo M.D.   On: 03/04/2020 13:07    (Echo, Carotid, EGD, Colonoscopy, ERCP)    Subjective: Patient seen and examined before going to surgery.  Husband at the bedside.  Both were keen to go home after procedure.  I was communicated by surgeon that they were able to close the wound and excised to the clean margin so patient should be able to go home.   Discharge Exam: Vitals:   03/08/20 1150 03/08/20 1210  BP: 123/74 123/79  Pulse: 98 98  Resp: 18 20  Temp: 97.9 F (36.6 C) 98.4 F (36.9 C)  SpO2: 99% 96%   Vitals:   03/08/20 1135 03/08/20 1145 03/08/20 1150 03/08/20 1210  BP: 132/74  123/74 123/79  Pulse: 100 100 98 98  Resp: '18 17 18 20  ' Temp: 97.9 F (36.6 C)  97.9 F (36.6  C) 98.4 F (36.9 C)  TempSrc:    Oral  SpO2: 99%  99% 96%  Weight:      Height:        General: Pt is alert, awake, not in acute distress Cardiovascular: RRR, S1/S2 +, no rubs, no gallops Respiratory: CTA bilaterally, no wheezing, no rhonchi Abdominal: Soft, NT, ND, bowel sounds + Extremities: Before surgery: Left foot with minimal dorsal swelling.  There was open pus draining wound on the dorsal aspect of the left fifth toe.  Tender to touch.    The results of significant diagnostics from this hospitalization (including imaging, microbiology, ancillary and laboratory) are listed below for reference.  Microbiology: Recent Results (from the past 240 hour(s))  Culture, blood (routine x 2)     Status: None (Preliminary result)   Collection Time: 03/05/20  1:10 PM   Specimen: BLOOD  Result Value Ref Range Status   Specimen Description BLOOD LEFT ANTECUBITAL  Final   Special Requests   Final    BOTTLES DRAWN AEROBIC AND ANAEROBIC Blood Culture results may not be optimal due to an inadequate volume of blood received in culture bottles   Culture   Final    NO GROWTH 2 DAYS Performed at [facility] [address], [place], [place] [address]    Report Status PENDING  Incomplete  Culture, blood (routine x 2)     Status: None (Preliminary result)   Collection Time: 03/05/20  1:36 PM   Specimen: BLOOD  Result Value Ref Range Status   Specimen Description BLOOD RIGHT ANTECUBITAL  Final   Special Requests   Final    BOTTLES DRAWN AEROBIC AND ANAEROBIC Blood Culture results may not be optimal due to an inadequate volume of blood received in culture bottles   Culture   Final    NO GROWTH 2 DAYS Performed at [facility], [place] [address] [place], [place] [address]    Report Status PENDING  Incomplete  SARS Coronavirus 2 by RT PCR (hospital order, performed in [facility] hospital lab) Nasopharyngeal Nasopharyngeal Swab     Status: None   Collection Time: 03/05/20  2:11  PM   Specimen: Nasopharyngeal Swab  Result Value Ref Range Status   SARS Coronavirus 2 NEGATIVE NEGATIVE Final    Comment: (NOTE) SARS-CoV-2 target nucleic acids are NOT DETECTED.  The SARS-CoV-2 RNA is generally detectable in upper and lower respiratory specimens during the acute phase of infection. The lowest concentration of SARS-CoV-2 viral copies this assay can detect is 250 copies / mL. A negative result does not preclude SARS-CoV-2 infection and should not be used as the sole basis for treatment or other patient management decisions.  A negative result may occur with improper specimen collection / handling, submission of specimen other than nasopharyngeal swab, presence of viral mutation(s) within the areas targeted by this assay, and inadequate number of viral copies (<250 copies / mL). A negative result must be combined with clinical observations, patient history, and epidemiological information.  Fact Sheet for Patients:   StrictlyIdeas.no  Fact Sheet for Healthcare Providers: BankingDealers.co.za  This test is not yet approved or  cleared by the [place] FDA and has been authorized for detection and/or diagnosis of SARS-CoV-2 by FDA under an Emergency Use Authorization (EUA).  This EUA will remain in effect (meaning this test can be used) for the duration of the COVID-19 declaration under Section 564(b)(1) of the Act, 21 U.S.C. section 360bbb-3(b)(1), unless the authorization is terminated or revoked sooner.  Performed at [facility], [place] [address], [place], [place] [address]   MRSA PCR Screening     Status: None   Collection Time: 03/07/20  8:29 PM   Specimen: Nasal Mucosa; Nasopharyngeal  Result Value Ref Range Status   MRSA by PCR NEGATIVE NEGATIVE Final    Comment:        The GeneXpert MRSA Assay (FDA approved for NASAL specimens only), is one component of a comprehensive MRSA  colonization surveillance program. It is not intended to diagnose MRSA infection nor to guide or monitor treatment for MRSA infections. Performed at [facility], [place],  [place] [address]      Labs: BNP (last 3 results) No results for input(s): BNP in the last 8760 hours. Basic Metabolic Panel: Recent Labs  Lab 03/04/20 1253 03/05/20 1328 03/06/20 0614 03/07/20 0347  NA 136 139 137 137  K 5.2* 5.1 5.4* 4.6  CL 103 105 105 105  CO2 '22 23 22 ' 21*  GLUCOSE 160* 119* 185* 128*  BUN 28* 23* 18 12  CREATININE 1.75* 1.40* 1.59* 1.27*  CALCIUM 8.7* 9.0 8.0* 8.3*   Liver Function Tests: Recent Labs  Lab 03/04/20 1253 03/05/20 1328  AST 16 17  ALT 13 15  ALKPHOS 72 80  BILITOT 0.4 0.4  PROT 6.6 6.8  ALBUMIN 3.0* 3.0*   No results for input(s): LIPASE, AMYLASE in the last 168 hours. No results for input(s): AMMONIA in the last 168 hours. CBC: Recent Labs  Lab 03/04/20 1253 03/05/20 1328 03/06/20 0614  WBC 10.8* 10.3 8.6  NEUTROABS 6.7 6.0  --   HGB 9.7* 9.8* 8.9*  HCT 30.4* 32.7* 28.6*  MCV 86.1 88.9 88.0  PLT 337 342 313   Cardiac Enzymes: No results for input(s): CKTOTAL, CKMB, CKMBINDEX, TROPONINI in the last 168 hours. BNP: Invalid input(s): POCBNP CBG: Recent Labs  Lab 03/07/20 1627 03/07/20 2039 03/08/20 0645 03/08/20 0936 03/08/20 1136  GLUCAP 145* 151* 149* 123* 120*   D-Dimer No results for input(s): DDIMER in the last 72 hours. Hgb A1c Recent Labs    03/05/20 1518  HGBA1C 7.3*   Lipid Profile No results for input(s): CHOL, HDL, LDLCALC, TRIG, CHOLHDL, LDLDIRECT in the last 72 hours. Thyroid function studies No results for input(s): TSH, T4TOTAL, T3FREE, THYROIDAB in the last 72 hours.  Invalid input(s): FREET3 Anemia work up No results for input(s): VITAMINB12, FOLATE, FERRITIN, TIBC, IRON, RETICCTPCT in the last 72 hours. Urinalysis    Component Value Date/Time   COLORURINE YELLOW 09/05/2016 1300    APPEARANCEUR CLEAR 09/05/2016 1300   LABSPEC 1.018 09/05/2016 1300   PHURINE 6.0 09/05/2016 1300   GLUCOSEU 50 (A) 09/05/2016 1300   HGBUR NEGATIVE 09/05/2016 1300   BILIRUBINUR NEGATIVE 09/05/2016 1300   KETONESUR NEGATIVE 09/05/2016 1300   PROTEINUR NEGATIVE 09/05/2016 1300   NITRITE NEGATIVE 09/05/2016 1300   LEUKOCYTESUR NEGATIVE 09/05/2016 1300   Sepsis Labs Invalid input(s): PROCALCITONIN,  WBC,  LACTICIDVEN Microbiology Recent Results (from the past 240 hour(s))  Culture, blood (routine x 2)     Status: None (Preliminary result)   Collection Time: 03/05/20  1:10 PM   Specimen: BLOOD  Result Value Ref Range Status   Specimen Description BLOOD LEFT ANTECUBITAL  Final   Special Requests   Final    BOTTLES DRAWN AEROBIC AND ANAEROBIC Blood Culture results may not be optimal due to an inadequate volume of blood received in culture bottles   Culture   Final    NO GROWTH 2 DAYS Performed at [facility], [place] [address] [place], [place] [address]    Report Status PENDING  Incomplete  Culture, blood (routine x 2)     Status: None (Preliminary result)   Collection Time: 03/05/20  1:36 PM   Specimen: BLOOD  Result Value Ref Range Status   Specimen Description BLOOD RIGHT ANTECUBITAL  Final   Special Requests   Final    BOTTLES DRAWN AEROBIC AND ANAEROBIC Blood Culture results may not be optimal due to an inadequate volume of blood received in culture bottles   Culture   Final    NO GROWTH  2 DAYS Performed at [facility], [place] [address] [place], [place] [address]    Report Status PENDING  Incomplete  SARS Coronavirus 2 by RT PCR (hospital order, performed in [facility] hospital lab) Nasopharyngeal Nasopharyngeal Swab     Status: None   Collection Time: 03/05/20  2:11 PM   Specimen: Nasopharyngeal Swab  Result Value Ref Range Status   SARS Coronavirus 2 NEGATIVE NEGATIVE Final    Comment: (NOTE) SARS-CoV-2 target nucleic acids are NOT DETECTED.  The SARS-CoV-2  RNA is generally detectable in upper and lower respiratory specimens during the acute phase of infection. The lowest concentration of SARS-CoV-2 viral copies this assay can detect is 250 copies / mL. A negative result does not preclude SARS-CoV-2 infection and should not be used as the sole basis for treatment or other patient management decisions.  A negative result may occur with improper specimen collection / handling, submission of specimen other than nasopharyngeal swab, presence of viral mutation(s) within the areas targeted by this assay, and inadequate number of viral copies (<250 copies / mL). A negative result must be combined with clinical observations, patient history, and epidemiological information.  Fact Sheet for Patients:   StrictlyIdeas.no  Fact Sheet for Healthcare Providers: BankingDealers.co.za  This test is not yet approved or  cleared by the [place] FDA and has been authorized for detection and/or diagnosis of SARS-CoV-2 by FDA under an Emergency Use Authorization (EUA).  This EUA will remain in effect (meaning this test can be used) for the duration of the COVID-19 declaration under Section 564(b)(1) of the Act, 21 U.S.C. section 360bbb-3(b)(1), unless the authorization is terminated or revoked sooner.  Performed at [facility], [place] [address] [place], [place] [address]   MRSA PCR Screening     Status: None   Collection Time: 03/07/20  8:29 PM   Specimen: Nasal Mucosa; Nasopharyngeal  Result Value Ref Range Status   MRSA by PCR NEGATIVE NEGATIVE Final    Comment:        The GeneXpert MRSA Assay (FDA approved for NASAL specimens only), is one component of a comprehensive MRSA colonization surveillance program. It is not intended to diagnose MRSA infection nor to guide or monitor treatment for MRSA infections. Performed at [facility], [place] [address], [place], [place] [address]       Time coordinating discharge: 35 minutes  SIGNED:   Barb Merino, MD  [facility] 03/08/2020, 12:50 PM

## 2020-03-09 ENCOUNTER — Encounter (HOSPITAL_COMMUNITY): Payer: Self-pay | Admitting: Sports Medicine

## 2020-03-10 ENCOUNTER — Emergency Department (HOSPITAL_COMMUNITY): Payer: 59

## 2020-03-10 ENCOUNTER — Other Ambulatory Visit: Payer: Self-pay

## 2020-03-10 ENCOUNTER — Telehealth: Payer: Self-pay

## 2020-03-10 ENCOUNTER — Emergency Department (HOSPITAL_COMMUNITY)
Admission: EM | Admit: 2020-03-10 | Discharge: 2020-03-10 | Disposition: A | Discharge status: Home / Self Care | Payer: 59 | Attending: Emergency Medicine | Admitting: Emergency Medicine

## 2020-03-10 ENCOUNTER — Encounter (HOSPITAL_COMMUNITY): Payer: Self-pay

## 2020-03-10 DIAGNOSIS — Z7984 Long term (current) use of oral hypoglycemic drugs: Secondary | ICD-10-CM | POA: Insufficient documentation

## 2020-03-10 DIAGNOSIS — R0789 Other chest pain: Secondary | ICD-10-CM | POA: Diagnosis present

## 2020-03-10 DIAGNOSIS — Z87891 Personal history of nicotine dependence: Secondary | ICD-10-CM | POA: Insufficient documentation

## 2020-03-10 DIAGNOSIS — Z7982 Long term (current) use of aspirin: Secondary | ICD-10-CM | POA: Insufficient documentation

## 2020-03-10 DIAGNOSIS — E1140 Type 2 diabetes mellitus with diabetic neuropathy, unspecified: Secondary | ICD-10-CM | POA: Insufficient documentation

## 2020-03-10 DIAGNOSIS — I10 Essential (primary) hypertension: Secondary | ICD-10-CM | POA: Diagnosis not present

## 2020-03-10 DIAGNOSIS — R072 Precordial pain: Secondary | ICD-10-CM | POA: Insufficient documentation

## 2020-03-10 DIAGNOSIS — Z79899 Other long term (current) drug therapy: Secondary | ICD-10-CM | POA: Insufficient documentation

## 2020-03-10 LAB — CBC WITH DIFFERENTIAL/PLATELET
Abs Immature Granulocytes: 0.17 10*3/uL — ABNORMAL HIGH (ref 0.00–0.07)
Basophils Absolute: 0.1 10*3/uL (ref 0.0–0.1)
Basophils Relative: 1 %
Eosinophils Absolute: 1.1 10*3/uL — ABNORMAL HIGH (ref 0.0–0.5)
Eosinophils Relative: 9 %
HCT: 33.4 % — ABNORMAL LOW (ref 36.0–46.0)
Hemoglobin: 10.1 g/dL — ABNORMAL LOW (ref 12.0–15.0)
Immature Granulocytes: 1 %
Lymphocytes Relative: 28 %
Lymphs Abs: 3.8 10*3/uL (ref 0.7–4.0)
MCH: 26.8 pg (ref 26.0–34.0)
MCHC: 30.2 g/dL (ref 30.0–36.0)
MCV: 88.6 fL (ref 80.0–100.0)
Monocytes Absolute: 1.1 10*3/uL — ABNORMAL HIGH (ref 0.1–1.0)
Monocytes Relative: 8 %
Neutro Abs: 7.1 10*3/uL (ref 1.7–7.7)
Neutrophils Relative %: 53 %
Platelets: 385 10*3/uL (ref 150–400)
RBC: 3.77 MIL/uL — ABNORMAL LOW (ref 3.87–5.11)
RDW: 15.8 % — ABNORMAL HIGH (ref 11.5–15.5)
WBC: 13.4 10*3/uL — ABNORMAL HIGH (ref 4.0–10.5)
nRBC: 0 % (ref 0.0–0.2)

## 2020-03-10 LAB — BASIC METABOLIC PANEL
Anion gap: 13 (ref 5–15)
BUN: 16 mg/dL (ref 6–20)
CO2: 19 mmol/L — ABNORMAL LOW (ref 22–32)
Calcium: 9 mg/dL (ref 8.9–10.3)
Chloride: 106 mmol/L (ref 98–111)
Creatinine, Ser: 1.24 mg/dL — ABNORMAL HIGH (ref 0.44–1.00)
GFR calc Af Amer: >60 mL/min (ref >60)
GFR calc non Af Amer: 55 mL/min — ABNORMAL LOW (ref >60)
Glucose, Bld: 171 mg/dL — ABNORMAL HIGH (ref 70–99)
Potassium: 4.1 mmol/L (ref 3.5–5.1)
Sodium: 138 mmol/L (ref 135–145)

## 2020-03-10 LAB — CULTURE, BLOOD (ROUTINE X 2)
Culture: NO GROWTH
Culture: NO GROWTH

## 2020-03-10 LAB — TROPONIN I (HIGH SENSITIVITY)
Troponin I (High Sensitivity): 3 ng/L (ref <18)
Troponin I (High Sensitivity): <2 ng/L (ref <18)

## 2020-03-10 IMAGING — DX DG CHEST 1V PORT
1 series · 1 of 1 positions shown · non-contrast
Comparison: [DATE]

CLINICAL DATA: Shortness of breath

EXAM:
PORTABLE CHEST 1 VIEW

[chest ap]
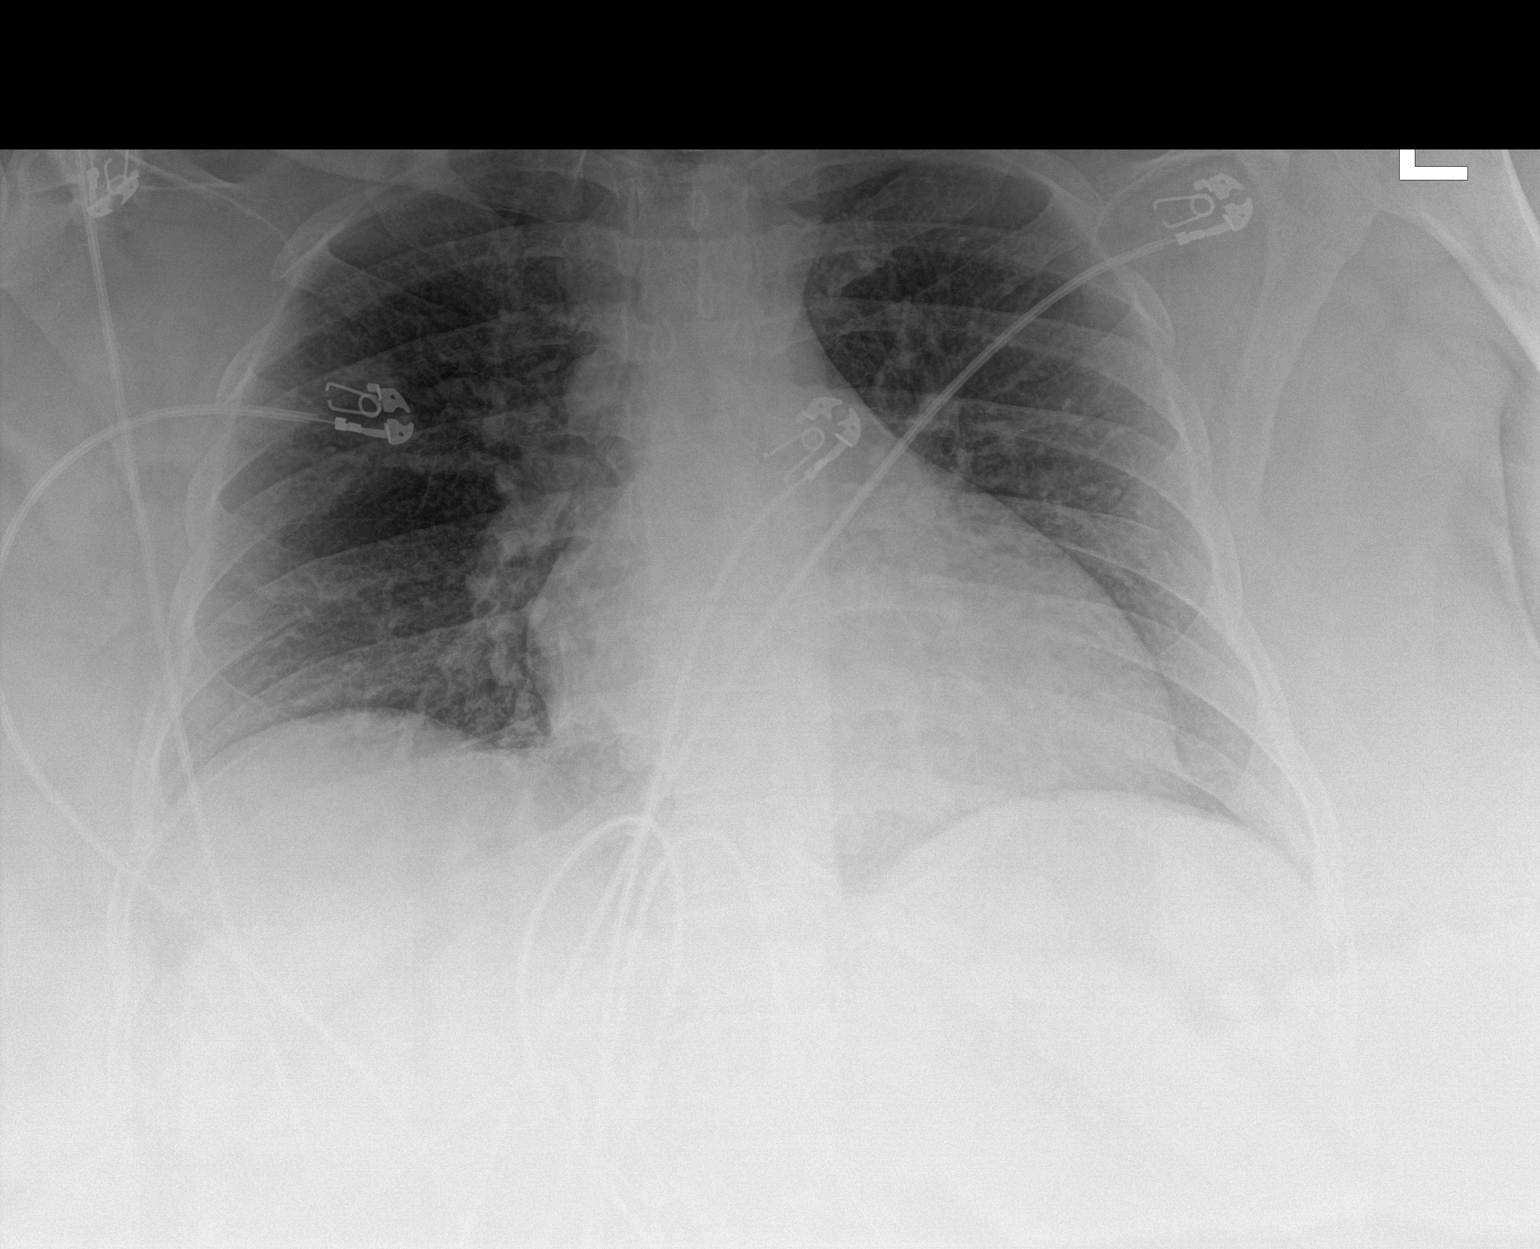

[1 of 1 positions shown; findings below may reference images not displayed]

FINDINGS: Low lung volumes. No new consolidation or edema. No pleural effusion
or pneumothorax. Stable cardiomediastinal contours.
IMPRESSION: No acute process in the chest.

## 2020-03-10 IMAGING — CT CT ANGIO CHEST
2 of 7 series · 17 of 46 positions shown · IV contrast (APPLIED)
Comparison: None.

CLINICAL DATA: Chest tightness and shortness of breath.

EXAM:
CT ANGIOGRAPHY CHEST WITH CONTRAST
TECHNIQUE: Multidetector CT imaging of the chest was performed using the
standard protocol during bolus administration of intravenous
contrast. Multiplanar CT image reconstructions and MIPs were
obtained to evaluate the vascular anatomy.
CONTRAST:  60mL OMNIPAQUE IOHEXOL 350 MG/ML SOLN

[Series 7: thins · axial · 0.92mm/px · z∈[-234,-30]mm · 14 of 330 slices shown]
[im 19/330  lung]
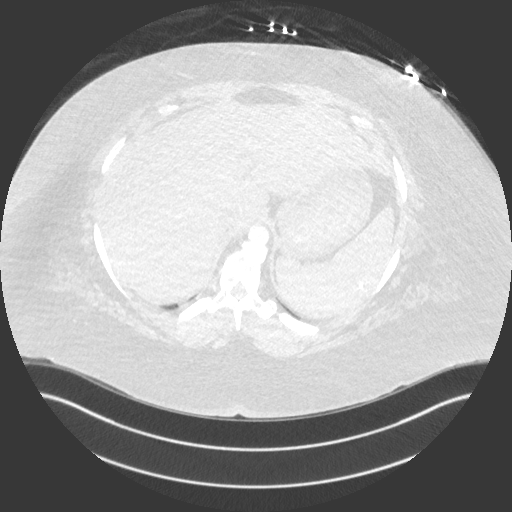
[im 37/330  soft-tissue]
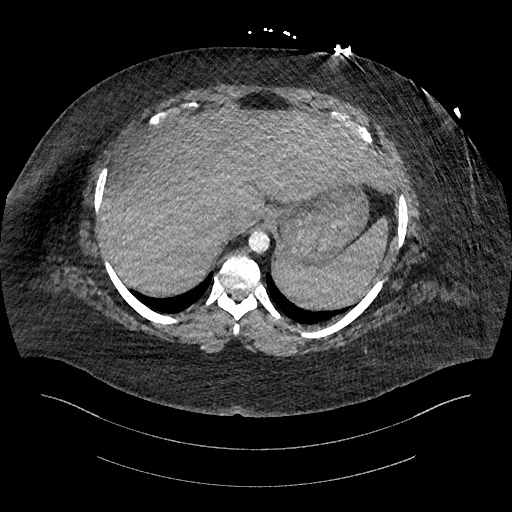
[im 74/330  lung]
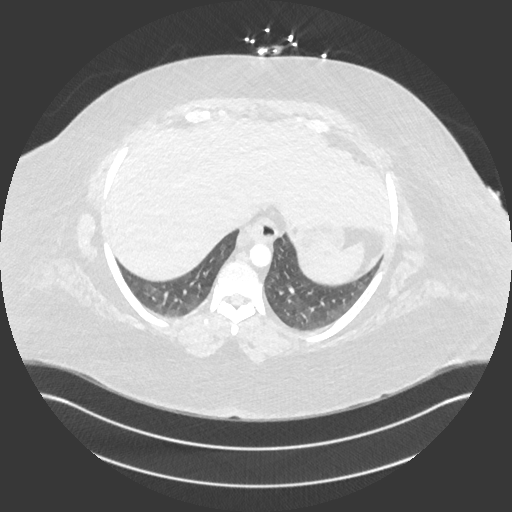
[im 92/330  soft-tissue]
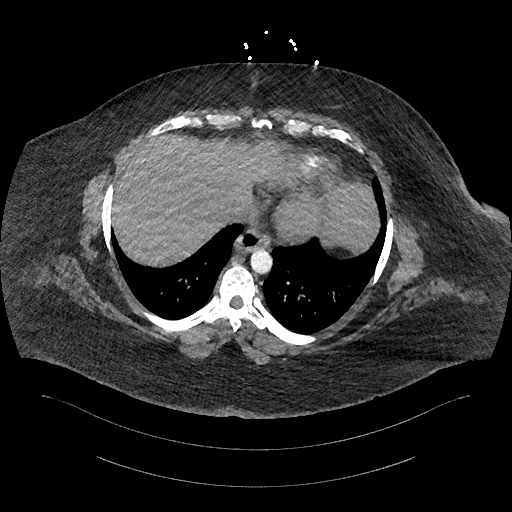
[im 110/330  lung]
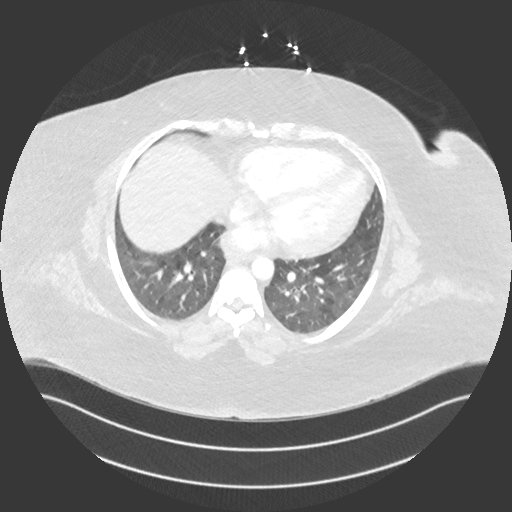
[im 128/330  soft-tissue]
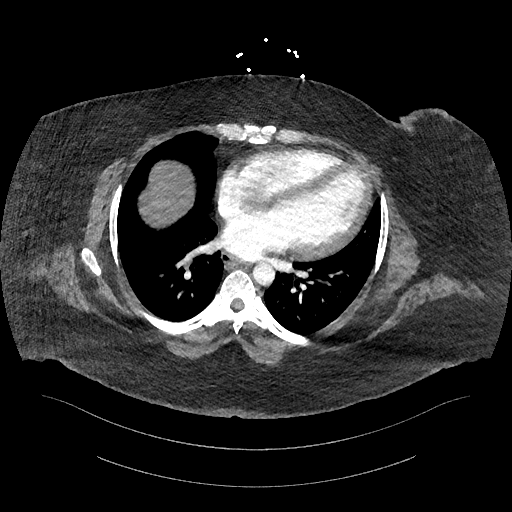
[im 147/330  lung]
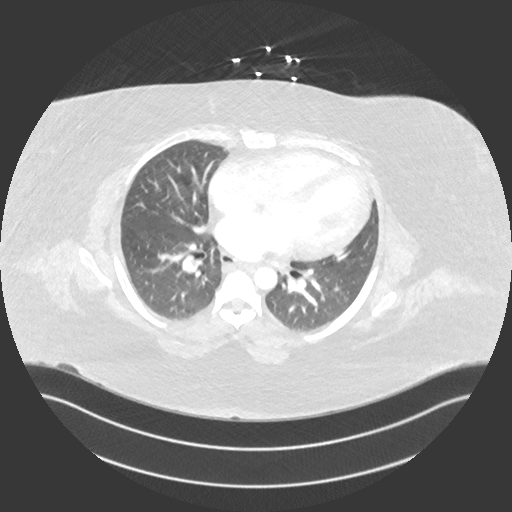
[im 183/330  soft-tissue]
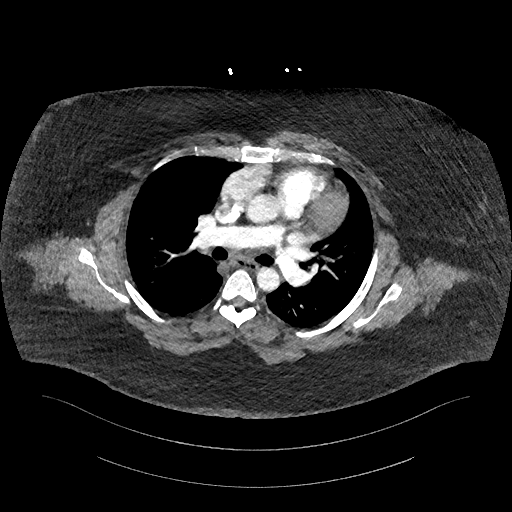
[im 202/330  lung]
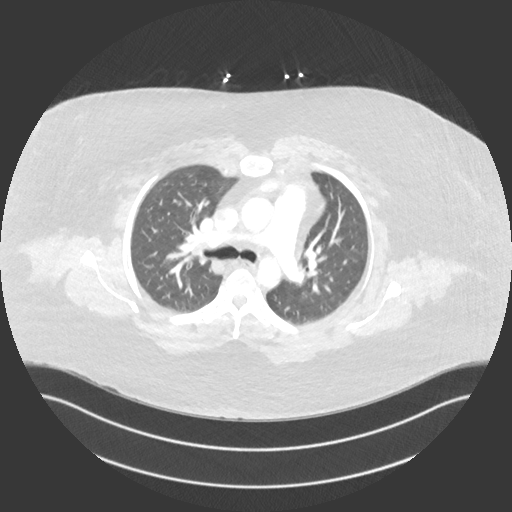
[im 220/330  soft-tissue]
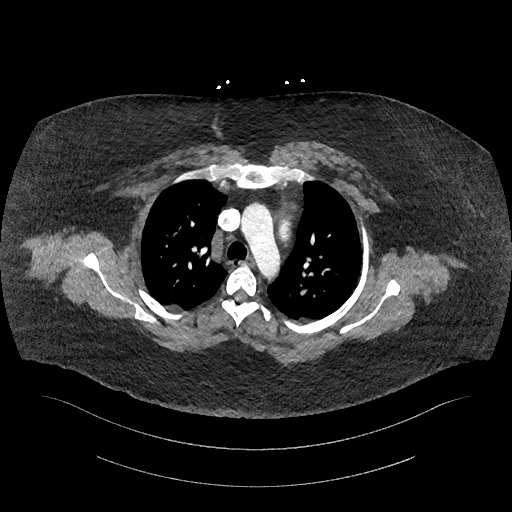
[im 238/330  lung]
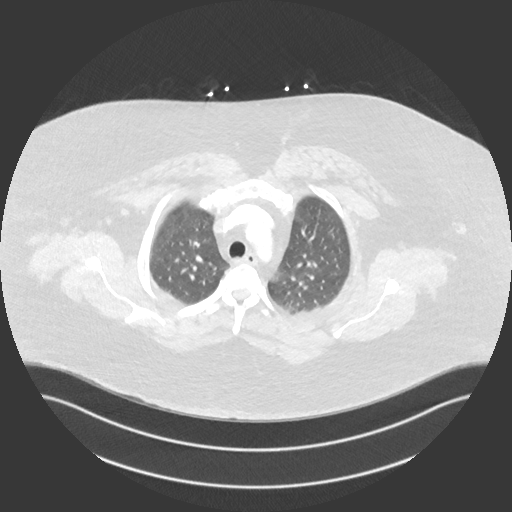
[im 256/330  soft-tissue]
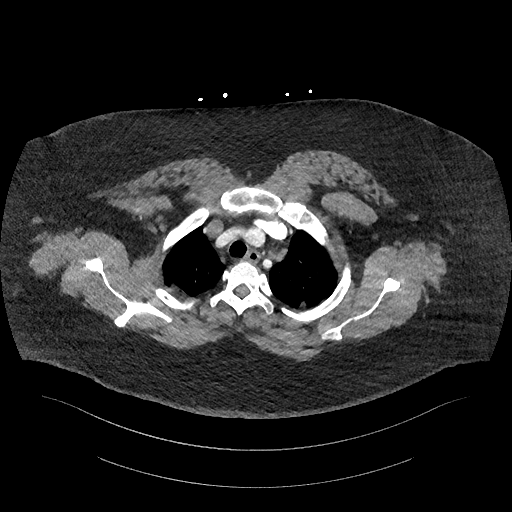
[im 293/330  lung]
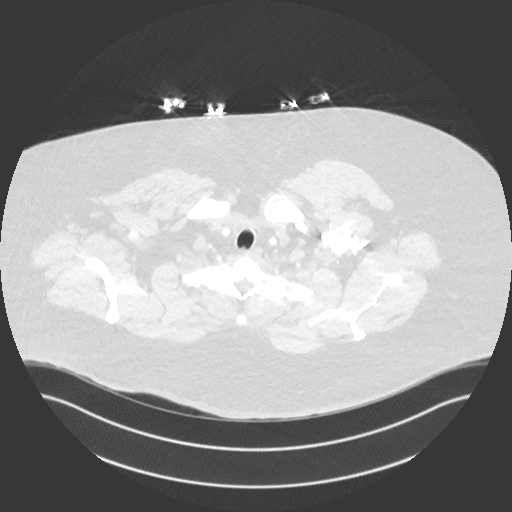
[im 311/330  soft-tissue]
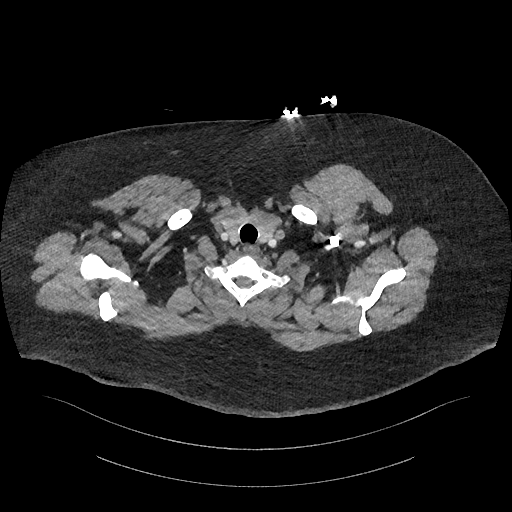

[Series 8: cor · coronal · 0.45mm/px · 3 of 174 slices shown]
[im 44/174  soft-tissue]
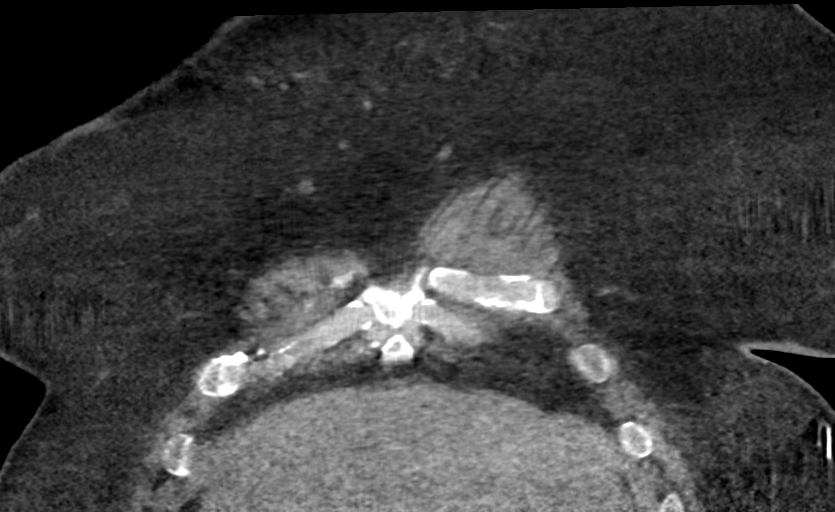
[im 87/174  soft-tissue]
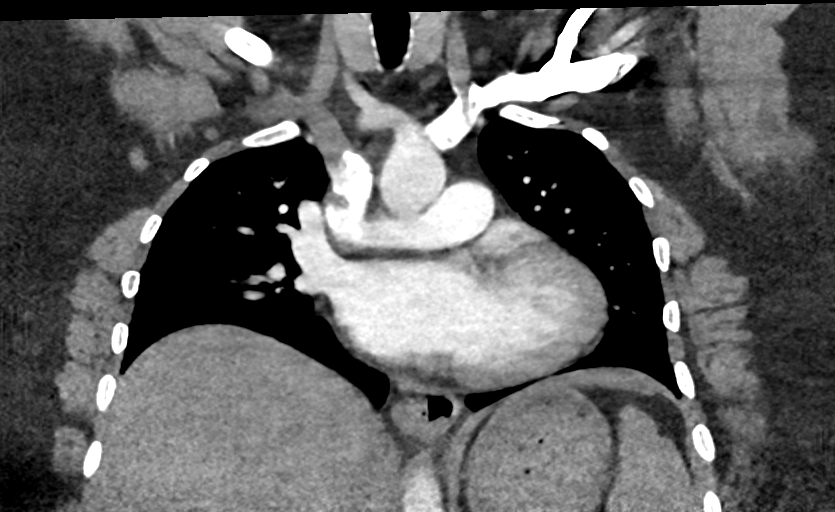
[im 130/174  soft-tissue]
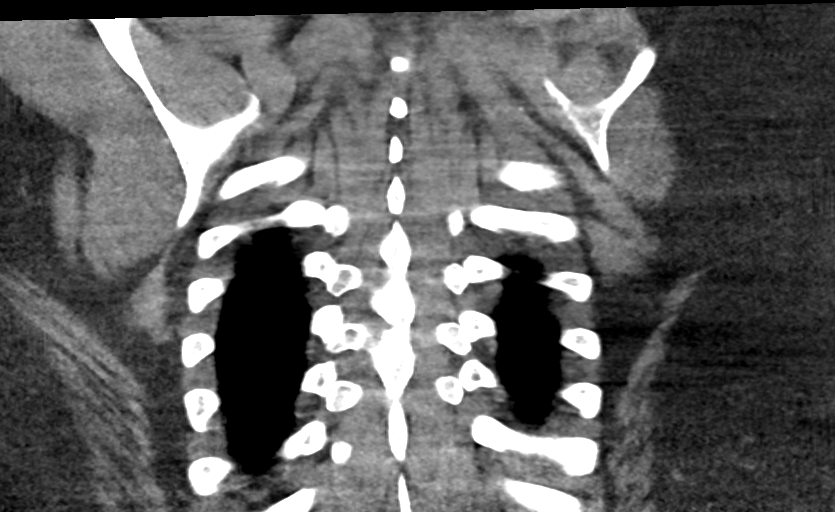

[17 of 46 positions shown; findings below may reference images not displayed]

FINDINGS: Cardiovascular: Satisfactory opacification of the pulmonary arteries
to the segmental level. No evidence of pulmonary embolism. Normal
heart size. No pericardial effusion.

Mediastinum/Nodes: No enlarged mediastinal, hilar, or axillary lymph
nodes. Thyroid gland, trachea, and esophagus demonstrate no
significant findings.

Lungs/Pleura: Lungs are clear. No pleural effusion or pneumothorax.

Upper Abdomen: A subcentimeter calcified granuloma is seen within
the posterolateral aspect of the spleen.

Musculoskeletal: No chest wall abnormality. No acute or significant
osseous findings.

Review of the MIP images confirms the above findings.
IMPRESSION: No CT evidence of pulmonary embolism or other acute intrathoracic
process.

## 2020-03-10 MED ORDER — EPINEPHRINE 0.3 MG/0.3ML IJ SOAJ
INTRAMUSCULAR | Status: AC
  Filled 2020-03-10: qty 0.3

## 2020-03-10 MED ORDER — IOHEXOL 350 MG/ML SOLN
100.0000 mL | Freq: Once | INTRAVENOUS | Status: AC | PRN
  Administered 2020-03-10: 60 mL via INTRAVENOUS

## 2020-03-10 MED ORDER — SODIUM CHLORIDE 0.9 % IV BOLUS
500.0000 mL | Freq: Once | INTRAVENOUS | Status: AC
  Administered 2020-03-10: 500 mL via INTRAVENOUS

## 2020-03-10 NOTE — ED Notes (Signed)
Pt verbalized understanding of d/c instructions, follow up care and s/s requiring return to ed. Pt had no additonal questions and was transported to exit via wheelchair.

## 2020-03-10 NOTE — ED Notes (Signed)
Pt transported to CT ?

## 2020-03-10 NOTE — Telephone Encounter (Signed)
Transition Care Management Follow-up Telephone Call Date of discharge and from where: 03/08/2020, [facility]  Call placed to patient.  She explained that she has a new PCP.  Dr Jillyn Hidden removed from Epic as PCP.   Patient stated that she has an appointment with the podiatrist tomorrow 03/11/2020.

## 2020-03-10 NOTE — ED Provider Notes (Signed)
[facility] EMERGENCY DEPARTMENT Provider Note   CSN: [id number] Arrival date & time: 03/10/20  1610     History No chief complaint on file.   Maria Morris is a 39 y.o. female.  The history is provided by the patient and medical records. No language interpreter was used.   Maria Morris is a 39 y.o. female who presents to the Emergency Department complaining of chest tightness.  She presents to the ED complaining of chest tightness and sob that began about 20 minutes prior to ED arrival.  She has a hx/o diabetes and recently had surgery for osteomyelitis to the left foot.  She took her home oxycodone this morning and thinks she may be having a reaction to the medication. She denies any fevers, swelling, abdominal pain, n/v, itching.  No prior similar sxs.  Sxs are severe and constant in nature.      Past Medical History:  Diagnosis Date  . Anemia   . Anxiety   . Diabetic neuropathy ([place])   . Gallstones   . GERD (gastroesophageal reflux disease)   . Hypertension   . Migraines   . Peripheral neuropathy   . Tachycardia   . Type 2 diabetes mellitus ([place]) 2014    Patient Active Problem List   Diagnosis Date Noted  . Sepsis ([place]) 03/07/2020  . AKI (acute kidney injury) ([place]) 03/07/2020  . DM II (diabetes mellitus, type II), controlled ([place]) 03/06/2020  . Osteomyelitis ([place]) 03/05/2020  . Well woman exam with routine gynecological exam 07/26/2019  . Breast pain, left 07/26/2019    Past Surgical History:  Procedure Laterality Date  . AMPUTATION TOE Left 03/08/2020   Procedure: AMPUTATION 5th TOE;  Surgeon: Landis Martins, DPM;  Location: [facility];  Service: Podiatry;  Laterality: Left;  . CHOLECYSTECTOMY N/A 10/16/2019   Procedure: LAPAROSCOPIC CHOLECYSTECTOMY;  Surgeon: Mickeal Skinner, MD;  Location: [facility];  Service: General;  Laterality: N/A;  . NO PAST SURGERIES       OB History    Gravida  0   Para  0   Term  0   Preterm  0     AB  0   Living  0     SAB  0   TAB  0   Ectopic  0   Multiple  0   Live Births  0           Family History  Problem Relation Age of Onset  . Diabetes Mother   . Diabetes Father   . Hypertension Father   . Heart disease Father   . Breast cancer Paternal Aunt   . Cancer Neg Hx     Social History   Tobacco Use  . Smoking status: Former Smoker    Quit date: 02/2019    Years since quitting: 1.0  . Smokeless tobacco: Never Used  Vaping Use  . Vaping Use: Never used  Substance Use Topics  . Alcohol use: Yes    Comment: occ  . Drug use: No    Home Medications Prior to Admission medications   Medication Sig Start Date End Date Taking? Authorizing Provider  aspirin-acetaminophen-caffeine (EXCEDRIN MIGRAINE) [phone] MG tablet Take 2 tablets by mouth every 6 (six) hours as needed for headache or migraine.   Yes [provider]  buPROPion (WELLBUTRIN XL) 150 MG 24 hr tablet Take 150 mg by mouth daily.  02/25/20  Yes [provider]  Continuous Blood Gluc Sensor ([place] G6 SENSOR)  MISC Inject 1 Device into the skin every 14 (fourteen) days.  03/04/20  Yes [provider]  cyclobenzaprine (FLEXERIL) 10 MG tablet Take 10 mg by mouth 2 (two) times daily as needed for muscle spasms.   Yes [provider]  Dulaglutide (TRULICITY) 1.5 MO/2.9UT SOPN Inject 1.5 mg into the skin once a week. Patient taking differently: Inject 1.5 mg into the skin every Monday.  09/10/19  Yes Fulp, Cammie, MD  DULoxetine (CYMBALTA) 60 MG capsule Take 1 capsule (60 mg total) by mouth daily. 07/27/19  Yes Fulp, Cammie, MD  ferrous gluconate (FERGON) 324 MG tablet Take 324 mg by mouth daily with breakfast.   Yes [provider]  fexofenadine (ALLEGRA) 180 MG tablet Take 180 mg by mouth daily.   Yes [provider]  gabapentin (NEURONTIN) 800 MG tablet Take 800 mg by mouth in the morning, at noon, and at bedtime.  03/05/20  Yes [provider]  glipiZIDE (GLUCOTROL) 10 MG tablet Take 1 tablet (10 mg total) by mouth 2 (two) times daily before a meal. 07/27/19  Yes Fulp, Cammie, MD  HYDROcodone-acetaminophen (NORCO) 10-325 MG tablet Take 1 tablet by mouth every 6 (six) hours as needed for up to 7 days. 03/08/20 03/15/20 Yes Stover, Titorya, DPM  indomethacin (INDOCIN) 50 MG capsule Take 50 mg by mouth 2 (two) times daily with a meal.   Yes [provider]  insulin glargine (LANTUS SOLOSTAR) 100 UNIT/ML Solostar Pen Inject 50 Units into the skin daily. Patient taking differently: Inject 50 Units into the skin at bedtime.  10/31/19  Yes Fulp, Cammie, MD  lisinopril (ZESTRIL) 20 MG tablet Take 20 mg by mouth daily. 02/18/20  Yes [provider]  meloxicam (MOBIC) 15 MG tablet Take 15 mg by mouth daily.   Yes [provider]  metFORMIN (GLUCOPHAGE) 500 MG tablet Take 1 tablet (500 mg total) by mouth 2 (two) times daily with a meal. 07/27/19  Yes Fulp, Cammie, MD  pantoprazole (PROTONIX) 40 MG tablet Take 1 tablet (40 mg total) by mouth daily. Patient taking differently: Take 40 mg by mouth daily before breakfast.  07/27/19  Yes Fulp, Cammie, MD  sulfamethoxazole-trimethoprim (BACTRIM) 400-80 MG tablet Take 1 tablet by mouth 2 (two) times daily. 03/08/20  Yes Stover, Titorya, DPM  topiramate (TOPAMAX) 25 MG tablet Take 25 mg by mouth at bedtime.  02/05/20  Yes [provider]  Vitamin D, Ergocalciferol, (DRISDOL) 1.25 MG (50000 UNIT) CAPS capsule Take 50,000 Units by mouth every Monday.  01/30/20  Yes [provider]  acetaminophen (TYLENOL) 500 MG tablet Take 1,000-2,000 mg by mouth 3 (three) times daily as needed for headache (migraine). Patient not taking: Reported on 03/10/2020    [provider]  Blood Glucose Monitoring Suppl (TRUE METRIX METER) w/Device KIT 1 each by Does not apply route 2 (two) times a day. 03/22/19   Argentina Donovan, PA-C  glucose blood (TRUE METRIX BLOOD GLUCOSE TEST) test  strip Use as instructed Patient taking differently: 1 each by Other route as directed.  03/22/19   Argentina Donovan, PA-C  ibuprofen (ADVIL) 800 MG tablet Take 1 tablet (800 mg total) by mouth every 8 (eight) hours as needed. Patient not taking: Reported on 03/10/2020 10/16/19   Kinsinger, Arta Bruce, MD  Insulin Pen Needle (TRUEPLUS PEN NEEDLES) 32G X 4 MM MISC Use as instructed. Patient taking differently: 1 each by Other route as directed.  10/10/19   Antony Blackbird, MD  TRUEplus Lancets  28G MISC 1 each by Does not apply route 2 (two) times a day. 03/22/19   Argentina Donovan, PA-C  sodium chloride (OCEAN) 0.65 % SOLN nasal spray Place 1 spray into both nostrils as needed for congestion. Patient not taking: Reported on 03/13/2019 07/24/18 03/13/19  Arville Lime, PA-C    Allergies    Patient has no known allergies.  Review of Systems   Review of Systems  All other systems reviewed and are negative.   Physical Exam Updated Vital Signs BP 134/82   Pulse (!) 108   Temp 98.2 F (36.8 C)   Resp (!) 33   Ht _0  (1.702 m)   Wt (!) 148.3 kg   LMP 02/10/2020 (Within Days)   SpO2 99%   BMI 51.22 kg/m   Physical Exam Vitals and nursing note reviewed.  Constitutional:      Appearance: She is well-developed.  HENT:     Head: Normocephalic and atraumatic.  Cardiovascular:     Rate and Rhythm: Regular rhythm. Tachycardia present.     Heart sounds: No murmur heard.   Pulmonary:     Effort: Pulmonary effort is normal. No respiratory distress.     Breath sounds: Normal breath sounds.  Abdominal:     Palpations: Abdomen is soft.     Tenderness: There is no abdominal tenderness. There is no guarding or rebound.  Musculoskeletal:        General: No tenderness.     Comments: Dressing to LLE in place.  Small amount of dried blood to plantar surface of the dressing.  No significant lower extremity edema.   Skin:    General: Skin is warm and dry.  Neurological:     Mental Status: She is  alert and oriented to person, place, and time.  Psychiatric:     Comments: Anxious.     ED Results / Procedures / Treatments   Labs (all labs ordered are listed, but only abnormal results are displayed) Labs Reviewed  BASIC METABOLIC PANEL - Abnormal; Notable for the following components:      Result Value   CO2 19 (*)    Glucose, Bld 171 (*)    Creatinine, Ser 1.24 (*)    GFR calc non Af Amer 55 (*)    All other components within normal limits  CBC WITH DIFFERENTIAL/PLATELET - Abnormal; Notable for the following components:   WBC 13.4 (*)    RBC 3.77 (*)    Hemoglobin 10.1 (*)    HCT 33.4 (*)    RDW 15.8 (*)    Monocytes Absolute 1.1 (*)    Eosinophils Absolute 1.1 (*)    Abs Immature Granulocytes 0.17 (*)    All other components within normal limits  TROPONIN I (HIGH SENSITIVITY)  TROPONIN I (HIGH SENSITIVITY)    EKG EKG Interpretation  Date/Time:  Monday March 10 2020 16:36:46 EDT Ventricular Rate:  107 PR Interval:    QRS Duration: 107 QT Interval:  340 QTC Calculation: 454 R Axis:   92 Text Interpretation: Sinus tachycardia Borderline right axis deviation Nonspecific T abnormalities, diffuse leads Confirmed by Quintella Reichert [phone]) on 03/10/2020 6:07:59 PM   Radiology CT Angio Chest PE W/Cm &/Or Wo Cm  Result Date: 03/10/2020 CLINICAL DATA:  Chest tightness and shortness of breath. EXAM: CT ANGIOGRAPHY CHEST WITH CONTRAST TECHNIQUE: Multidetector CT imaging of the chest was performed using the standard protocol during bolus administration of intravenous contrast. Multiplanar CT image reconstructions and MIPs were obtained to evaluate  the vascular anatomy. CONTRAST:  59m OMNIPAQUE IOHEXOL 350 MG/ML SOLN COMPARISON:  None. FINDINGS: Cardiovascular: Satisfactory opacification of the pulmonary arteries to the segmental level. No evidence of pulmonary embolism. Normal heart size. No pericardial effusion. Mediastinum/Nodes: No enlarged mediastinal, hilar, or axillary  lymph nodes. Thyroid gland, trachea, and esophagus demonstrate no significant findings. Lungs/Pleura: Lungs are clear. No pleural effusion or pneumothorax. Upper Abdomen: A subcentimeter calcified granuloma is seen within the posterolateral aspect of the spleen. Musculoskeletal: No chest wall abnormality. No acute or significant osseous findings. Review of the MIP images confirms the above findings. IMPRESSION: No CT evidence of pulmonary embolism or other acute intrathoracic process. Electronically Signed   By: TVirgina NorfolkM.D.   On: 03/10/2020 19:40   DG Chest Port 1 View  Result Date: 03/10/2020 CLINICAL DATA:  Shortness of breath EXAM: PORTABLE CHEST 1 VIEW COMPARISON:  03/13/2019 FINDINGS: Low lung volumes. No new consolidation or edema. No pleural effusion or pneumothorax. Stable cardiomediastinal contours. IMPRESSION: No acute process in the chest. Electronically Signed   By: PMacy MisM.D.   On: 03/10/2020 16:56    Procedures Procedures (including critical care time)  Medications Ordered in ED Medications  sodium chloride 0.9 % bolus 500 mL (0 mLs Intravenous Stopped 03/10/20 1948)  iohexol (OMNIPAQUE) 350 MG/ML injection 100 mL (60 mLs Intravenous Contrast Given 03/10/20 1856)    ED Course  I have reviewed the triage vital signs and the nursing notes.  Pertinent labs & imaging results that were available during my care of the patient were reviewed by me and considered in my medical decision making (see chart for details).    MDM Rules/Calculators/A&P                         Patient with history of diabetes, osteomyelitis here for evaluation of chest pain and shortness of breath that began prior to ED arrival. On initial assessment patient tachycardic, tachypneic and very anxious appearing. EKG without acute ischemic changes. No evidence of systemic symptoms or anaphylaxis. On repeat assessment her chest tightness and shortness of breath resolved. CTA obtained given her  recent hospitalization and surgery. CT negative for PE. On repeat assessment patient continues to appear improved. Current presentation is not consistent with allergic reaction to medication. Favor possible anxiety component contributing to her symptoms. Plan to discharge home with close outpatient follow-up and return precautions.  Final Clinical Impression(s) / ED Diagnoses Final diagnoses:  Precordial pain    Rx / DC Orders ED Discharge Orders    None       RQuintella Reichert MD 03/11/20 0Benancio Deeds

## 2020-03-10 NOTE — ED Triage Notes (Addendum)
Pt from home; took oxycodone this am; prescribed after recent toe amputation; took another oxy around 1500; shortly after taking, pt began feeling sob, "couldn't talk right", state she wasn't making sense, had difficulty standing, L arm tingling, felt eyes were closing; no hx of same; endorses hx panic attacks; on arrival to room, pt noted to be hyperventilating, having difficulty speaking full sentences; pt now settled on stretcher, currently a and o x 4, NAD

## 2020-03-11 ENCOUNTER — Ambulatory Visit (INDEPENDENT_AMBULATORY_CARE_PROVIDER_SITE_OTHER): Payer: 59 | Admitting: Sports Medicine

## 2020-03-11 ENCOUNTER — Encounter: Payer: Self-pay | Admitting: Sports Medicine

## 2020-03-11 ENCOUNTER — Ambulatory Visit (INDEPENDENT_AMBULATORY_CARE_PROVIDER_SITE_OTHER): Payer: 59

## 2020-03-11 DIAGNOSIS — E1142 Type 2 diabetes mellitus with diabetic polyneuropathy: Secondary | ICD-10-CM

## 2020-03-11 DIAGNOSIS — M869 Osteomyelitis, unspecified: Secondary | ICD-10-CM

## 2020-03-11 DIAGNOSIS — Z9889 Other specified postprocedural states: Secondary | ICD-10-CM

## 2020-03-11 DIAGNOSIS — M79676 Pain in unspecified toe(s): Secondary | ICD-10-CM

## 2020-03-11 DIAGNOSIS — S98132A Complete traumatic amputation of one left lesser toe, initial encounter: Secondary | ICD-10-CM

## 2020-03-11 LAB — SURGICAL PATHOLOGY

## 2020-03-11 NOTE — Progress Notes (Signed)
Subjective: Maria Morris is a 39 y.o. female patient seen today in office for POV #1 (DOS 03/08/2020), S/P left fifth toe amputation. Patient denies pain at surgical site, denies calf pain, denies headache, chest pain, shortness of breath, nausea, vomiting, fever, or chills. Patient states that she is doing well taking pain medicine as needed and Bactrim antibiotic. No other issues noted.   Patient is assisted by husband this visit.  Patient Active Problem List   Diagnosis Date Noted  . Sepsis ([place]) 03/07/2020  . AKI (acute kidney injury) ([place]) 03/07/2020  . DM II (diabetes mellitus, type II), controlled ([place]) 03/06/2020  . Osteomyelitis ([place]) 03/05/2020  . Elevated C-reactive protein (CRP) 01/29/2020  . Elevated sed rate 01/29/2020  . Gastroesophageal reflux disease 01/29/2020  . Myalgia 01/29/2020  . Seasonal allergic rhinitis due to pollen 01/29/2020  . Photosensitivity dermatitis 12/26/2019  . Hypertension associated with diabetes ([place]) 11/27/2019  . Amenorrhea 11/14/2019  . Class 3 severe obesity due to excess calories with serious comorbidity and body mass index (BMI) of 50.0 to 59.9 in adult ([place]) 11/14/2019  . GAD (generalized anxiety disorder) 11/14/2019  . Moderate episode of recurrent major depressive disorder ([place]) 11/14/2019  . Palpitations 11/14/2019  . Polyarthralgia 11/14/2019  . Well woman exam with routine gynecological exam 07/26/2019  . Breast pain, left 07/26/2019    Current Outpatient Medications on File Prior to Visit  Medication Sig Dispense Refill  . acetaminophen (TYLENOL) 500 MG tablet Take 1,000-2,000 mg by mouth 3 (three) times daily as needed for headache (migraine).     Marland Kitchen aspirin-acetaminophen-caffeine (EXCEDRIN MIGRAINE) 250-250-65 MG tablet Take 2 tablets by mouth every 6 (six) hours as needed for headache or migraine.    . Blood Glucose Monitoring Suppl (TRUE METRIX METER) w/Device KIT 1 each by Does not apply route 2 (two) times a day. 1 kit 0   . buPROPion (WELLBUTRIN XL) 150 MG 24 hr tablet Take 150 mg by mouth daily.     . Continuous Blood Gluc Sensor (DEXCOM G6 SENSOR) MISC Inject 1 Device into the skin every 14 (fourteen) days.     . Continuous Blood Gluc Transmit (DEXCOM G6 TRANSMITTER) MISC See admin instructions.    . cyclobenzaprine (FLEXERIL) 10 MG tablet Take 10 mg by mouth 2 (two) times daily as needed for muscle spasms.    . Dulaglutide (TRULICITY) 1.5 UX/3.2TF SOPN Inject 1.5 mg into the skin once a week. (Patient taking differently: Inject 1.5 mg into the skin every Monday. ) 2 mL 2  . DULoxetine (CYMBALTA) 60 MG capsule Take 1 capsule (60 mg total) by mouth daily. 30 capsule 3  . ferrous gluconate (FERGON) 324 MG tablet Take 324 mg by mouth daily with breakfast.    . fexofenadine (ALLEGRA) 180 MG tablet Take 180 mg by mouth daily.    Marland Kitchen gabapentin (NEURONTIN) 800 MG tablet Take 800 mg by mouth in the morning, at noon, and at bedtime.     Marland Kitchen glipiZIDE (GLUCOTROL) 10 MG tablet Take 1 tablet (10 mg total) by mouth 2 (two) times daily before a meal. 60 tablet 3  . glucose blood (TRUE METRIX BLOOD GLUCOSE TEST) test strip Use as instructed (Patient taking differently: 1 each by Other route as directed. ) 100 each 12  . HYDROcodone-acetaminophen (NORCO) 10-325 MG tablet Take 1 tablet by mouth every 6 (six) hours as needed for up to 7 days. 28 tablet 0  . hydrOXYzine (ATARAX/VISTARIL) 10 MG tablet Take by mouth.    Marland Kitchen  ibuprofen (ADVIL) 800 MG tablet Take 1 tablet (800 mg total) by mouth every 8 (eight) hours as needed. 30 tablet 0  . indomethacin (INDOCIN) 50 MG capsule Take 50 mg by mouth 2 (two) times daily with a meal.    . insulin glargine (LANTUS SOLOSTAR) 100 UNIT/ML Solostar Pen Inject 50 Units into the skin daily. (Patient taking differently: Inject 50 Units into the skin at bedtime. ) 15 mL 2  . Insulin Pen Needle (TRUEPLUS PEN NEEDLES) 32G X 4 MM MISC Use as instructed. (Patient taking differently: 1 each by Other route as  directed. ) 100 each 6  . lisinopril (ZESTRIL) 20 MG tablet Take 20 mg by mouth daily.    . meloxicam (MOBIC) 15 MG tablet Take 15 mg by mouth daily.    . metFORMIN (GLUCOPHAGE) 500 MG tablet Take 1 tablet (500 mg total) by mouth 2 (two) times daily with a meal. 60 tablet 2  . pantoprazole (PROTONIX) 40 MG tablet Take 1 tablet (40 mg total) by mouth daily. (Patient taking differently: Take 40 mg by mouth daily before breakfast. ) 30 tablet 2  . sulfamethoxazole-trimethoprim (BACTRIM) 400-80 MG tablet Take 1 tablet by mouth 2 (two) times daily. 28 tablet 0  . topiramate (TOPAMAX) 25 MG tablet Take 25 mg by mouth at bedtime.     . TRUEplus Lancets 28G MISC 1 each by Does not apply route 2 (two) times a day. 100 each 1  . Vitamin D, Ergocalciferol, (DRISDOL) 1.25 MG (50000 UNIT) CAPS capsule Take 50,000 Units by mouth every Monday.     . [DISCONTINUED] sodium chloride (OCEAN) 0.65 % SOLN nasal spray Place 1 spray into both nostrils as needed for congestion. (Patient not taking: Reported on 03/13/2019) 1 Bottle 0   No current facility-administered medications on file prior to visit.    No Known Allergies  Objective: There were no vitals filed for this visit.  General: No acute distress, AAOx3  Left foot: Sutures intact with no gapping or dehiscence at surgical site, mild swelling to left foot, no erythema, no warmth, bloody drainage with mild maceration, no signs of infection noted, Capillary fill time <3 seconds in all remaining digits, gross sensation present via light touch to left but protective is diminished.  No pain or crepitation with range of motion left foot.  No pain with calf compression.   Post Op Xray, left foot: Consistent with amputation status of the fifth toe and distal metatarsal.  Soft tissue swelling within normal limits for post op status.   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 ([place])       Status post left foot surgery       Diabetic polyneuropathy associated with type 2 diabetes mellitus ([facility])       Relevant Medications   hydrOXYzine (ATARAX/VISTARIL) 10 MG tablet       -Patient seen and evaluated -X-rays reviewed -Applied Betadine and dry sterile dressing to surgical site left foot secured with ACE wrap and stockinet  -Advised patient to make sure to keep dressings clean, dry, and intact to left surgical site -Advised patient to continue with post-op shoe on left foot  with use of cane for stability -Advised patient to limit activity to necessity  -Advised patient to ice and elevate as necessary  -Continue with Norco for pain -Continue with Bactrim antibiotic until completed -Will plan for incision site check at next office visit. In the meantime, patient to call office if any issues or problems arise.  Advised patient's husband that he can possibly apply for medical leave short-term to allow excuse from work when he has to bring his wife to her doctor's appointments.  Landis Martins, DPM

## 2020-03-18 ENCOUNTER — Encounter: Payer: Self-pay | Admitting: Sports Medicine

## 2020-03-18 ENCOUNTER — Other Ambulatory Visit: Payer: Self-pay

## 2020-03-18 ENCOUNTER — Ambulatory Visit (INDEPENDENT_AMBULATORY_CARE_PROVIDER_SITE_OTHER): Payer: 59 | Admitting: Sports Medicine

## 2020-03-18 DIAGNOSIS — S98132A Complete traumatic amputation of one left lesser toe, initial encounter: Secondary | ICD-10-CM

## 2020-03-18 DIAGNOSIS — M869 Osteomyelitis, unspecified: Secondary | ICD-10-CM

## 2020-03-18 DIAGNOSIS — E1142 Type 2 diabetes mellitus with diabetic polyneuropathy: Secondary | ICD-10-CM

## 2020-03-18 DIAGNOSIS — Z9889 Other specified postprocedural states: Secondary | ICD-10-CM

## 2020-03-18 NOTE — Progress Notes (Signed)
Subjective: Maria Morris is a 39 y.o. female patient seen today in office for POV #2 (DOS 03/08/2020), S/P left fifth toe amputation. Patient denies pain at surgical site but husband reports that she doesn't take her pain meds like she should, denies calf pain, denies headache, chest pain, shortness of breath, nausea, vomiting, fever, or chills. No other issues noted.   Patient Active Problem List   Diagnosis Date Noted  . Sepsis ([place]) 03/07/2020  . AKI (acute kidney injury) ([place]) 03/07/2020  . DM II (diabetes mellitus, type II), controlled ([place]) 03/06/2020  . Osteomyelitis ([place]) 03/05/2020  . Elevated C-reactive protein (CRP) 01/29/2020  . Elevated sed rate 01/29/2020  . Gastroesophageal reflux disease 01/29/2020  . Myalgia 01/29/2020  . Seasonal allergic rhinitis due to pollen 01/29/2020  . Photosensitivity dermatitis 12/26/2019  . Hypertension associated with diabetes ([place]) 11/27/2019  . Amenorrhea 11/14/2019  . Class 3 severe obesity due to excess calories with serious comorbidity and body mass index (BMI) of 50.0 to 59.9 in adult ([place]) 11/14/2019  . GAD (generalized anxiety disorder) 11/14/2019  . Moderate episode of recurrent major depressive disorder ([place]) 11/14/2019  . Palpitations 11/14/2019  . Polyarthralgia 11/14/2019  . Well woman exam with routine gynecological exam 07/26/2019  . Breast pain, left 07/26/2019    Current Outpatient Medications on File Prior to Visit  Medication Sig Dispense Refill  . acetaminophen (TYLENOL) 500 MG tablet Take 1,000-2,000 mg by mouth 3 (three) times daily as needed for headache (migraine).     Marland Kitchen aspirin-acetaminophen-caffeine (EXCEDRIN MIGRAINE) 250-250-65 MG tablet Take 2 tablets by mouth every 6 (six) hours as needed for headache or migraine.    . Blood Glucose Monitoring Suppl (TRUE METRIX METER) w/Device KIT 1 each by Does not apply route 2 (two) times a day. 1 kit 0  . buPROPion (WELLBUTRIN XL) 150 MG 24 hr tablet Take 150 mg by  mouth daily.     . Continuous Blood Gluc Sensor (DEXCOM G6 SENSOR) MISC Inject 1 Device into the skin every 14 (fourteen) days.     . Continuous Blood Gluc Transmit (DEXCOM G6 TRANSMITTER) MISC See admin instructions.    . cyclobenzaprine (FLEXERIL) 10 MG tablet Take 10 mg by mouth 2 (two) times daily as needed for muscle spasms.    . Dulaglutide (TRULICITY) 1.5 AV/6.9VX SOPN Inject 1.5 mg into the skin once a week. (Patient taking differently: Inject 1.5 mg into the skin every Monday. ) 2 mL 2  . DULoxetine (CYMBALTA) 60 MG capsule Take 1 capsule (60 mg total) by mouth daily. 30 capsule 3  . ferrous gluconate (FERGON) 324 MG tablet Take 324 mg by mouth daily with breakfast.    . fexofenadine (ALLEGRA) 180 MG tablet Take 180 mg by mouth daily.    Marland Kitchen gabapentin (NEURONTIN) 800 MG tablet Take 800 mg by mouth in the morning, at noon, and at bedtime.     Marland Kitchen glipiZIDE (GLUCOTROL) 10 MG tablet Take 1 tablet (10 mg total) by mouth 2 (two) times daily before a meal. 60 tablet 3  . glucose blood (TRUE METRIX BLOOD GLUCOSE TEST) test strip Use as instructed (Patient taking differently: 1 each by Other route as directed. ) 100 each 12  . hydrOXYzine (ATARAX/VISTARIL) 10 MG tablet Take by mouth.    Marland Kitchen ibuprofen (ADVIL) 800 MG tablet Take 1 tablet (800 mg total) by mouth every 8 (eight) hours as needed. 30 tablet 0  . indomethacin (INDOCIN) 50 MG capsule Take 50 mg by mouth 2 (two)  times daily with a meal.    . insulin glargine (LANTUS SOLOSTAR) 100 UNIT/ML Solostar Pen Inject 50 Units into the skin daily. (Patient taking differently: Inject 50 Units into the skin at bedtime. ) 15 mL 2  . Insulin Pen Needle (TRUEPLUS PEN NEEDLES) 32G X 4 MM MISC Use as instructed. (Patient taking differently: 1 each by Other route as directed. ) 100 each 6  . lisinopril (ZESTRIL) 20 MG tablet Take 20 mg by mouth daily.    . meloxicam (MOBIC) 15 MG tablet Take 15 mg by mouth daily.    . metFORMIN (GLUCOPHAGE) 500 MG tablet Take 1  tablet (500 mg total) by mouth 2 (two) times daily with a meal. 60 tablet 2  . pantoprazole (PROTONIX) 40 MG tablet Take 1 tablet (40 mg total) by mouth daily. (Patient taking differently: Take 40 mg by mouth daily before breakfast. ) 30 tablet 2  . sulfamethoxazole-trimethoprim (BACTRIM) 400-80 MG tablet Take 1 tablet by mouth 2 (two) times daily. 28 tablet 0  . topiramate (TOPAMAX) 25 MG tablet Take 25 mg by mouth at bedtime.     . TRUEplus Lancets 28G MISC 1 each by Does not apply route 2 (two) times a day. 100 each 1  . Vitamin D, Ergocalciferol, (DRISDOL) 1.25 MG (50000 UNIT) CAPS capsule Take 50,000 Units by mouth every Monday.     . [DISCONTINUED] sodium chloride (OCEAN) 0.65 % SOLN nasal spray Place 1 spray into both nostrils as needed for congestion. (Patient not taking: Reported on 03/13/2019) 1 Bottle 0   No current facility-administered medications on file prior to visit.    No Known Allergies  Objective: There were no vitals filed for this visit.  General: No acute distress, AAOx3  Left foot: Sutures intact with no gapping or dehiscence at surgical site, mild swelling to left foot, no erythema, no warmth, bloody drainage with mild maceration, no signs of infection noted, Capillary fill time <3 seconds in all remaining digits, gross sensation present via light touch to left but protective is diminished.  No pain or crepitation with range of motion left foot.  No pain with calf compression.   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 ([place])       Status post left foot surgery       Diabetic polyneuropathy associated with type 2 diabetes mellitus ([place])           -Patient seen and evaluated -Applied Betadine and dry sterile dressing to surgical site left foot secured with ACE wrap and stockinet  -Advised patient to make sure  to keep dressings clean, dry, and intact to left surgical site -Advised patient to continue with post-op shoe on left foot with use of cane for stability -Advised patient to limit activity to necessity  -Advised patient to ice and elevate as necessary  -Continue with Norco for pain recommend BID and mobic at lunch  -Continue with Bactrim antibiotic until completed -Will plan for sutures at next office visit. In the meantime, patient to call office if any issues or problems arise.   Landis Martins, DPM

## 2020-03-27 ENCOUNTER — Ambulatory Visit (INDEPENDENT_AMBULATORY_CARE_PROVIDER_SITE_OTHER): Payer: 59 | Admitting: Sports Medicine

## 2020-03-27 ENCOUNTER — Encounter: Payer: Self-pay | Admitting: Sports Medicine

## 2020-03-27 ENCOUNTER — Other Ambulatory Visit: Payer: Self-pay

## 2020-03-27 ENCOUNTER — Encounter (HOSPITAL_BASED_OUTPATIENT_CLINIC_OR_DEPARTMENT_OTHER): Payer: 59 | Admitting: Internal Medicine

## 2020-03-27 DIAGNOSIS — E1142 Type 2 diabetes mellitus with diabetic polyneuropathy: Secondary | ICD-10-CM

## 2020-03-27 DIAGNOSIS — Z9889 Other specified postprocedural states: Secondary | ICD-10-CM

## 2020-03-27 DIAGNOSIS — S98132A Complete traumatic amputation of one left lesser toe, initial encounter: Secondary | ICD-10-CM

## 2020-03-27 DIAGNOSIS — M869 Osteomyelitis, unspecified: Secondary | ICD-10-CM

## 2020-03-27 MED ORDER — HYDROCODONE-ACETAMINOPHEN 10-325 MG PO TABS: 1.0000 | ORAL_TABLET | Freq: Four times a day (QID) | ORAL | 0 refills | Status: AC | PRN

## 2020-03-27 NOTE — Progress Notes (Signed)
Subjective: Maria Morris is a 39 y.o. female patient seen today in office for POV #3 (DOS 03/08/2020), S/P left fifth toe amputation. Patient admits pain at surgical site, denies headache, chest pain, shortness of breath, nausea, vomiting, fever, or chills. No other issues noted.   Patient Active Problem List   Diagnosis Date Noted  . Sepsis ([place]) 03/07/2020  . AKI (acute kidney injury) ([place]) 03/07/2020  . DM II (diabetes mellitus, type II), controlled ([place]) 03/06/2020  . Osteomyelitis ([place]) 03/05/2020  . Elevated C-reactive protein (CRP) 01/29/2020  . Elevated sed rate 01/29/2020  . Gastroesophageal reflux disease 01/29/2020  . Myalgia 01/29/2020  . Seasonal allergic rhinitis due to pollen 01/29/2020  . Photosensitivity dermatitis 12/26/2019  . Hypertension associated with diabetes ([place]) 11/27/2019  . Amenorrhea 11/14/2019  . Class 3 severe obesity due to excess calories with serious comorbidity and body mass index (BMI) of 50.0 to 59.9 in adult ([place]) 11/14/2019  . GAD (generalized anxiety disorder) 11/14/2019  . Moderate episode of recurrent major depressive disorder ([place]) 11/14/2019  . Palpitations 11/14/2019  . Polyarthralgia 11/14/2019  . Well woman exam with routine gynecological exam 07/26/2019  . Breast pain, left 07/26/2019    Current Outpatient Medications on File Prior to Visit  Medication Sig Dispense Refill  . acetaminophen (TYLENOL) 500 MG tablet Take 1,000-2,000 mg by mouth 3 (three) times daily as needed for headache (migraine).     Marland Kitchen aspirin-acetaminophen-caffeine (EXCEDRIN MIGRAINE) 250-250-65 MG tablet Take 2 tablets by mouth every 6 (six) hours as needed for headache or migraine.    . Blood Glucose Monitoring Suppl (TRUE METRIX METER) w/Device KIT 1 each by Does not apply route 2 (two) times a day. 1 kit 0  . buPROPion (WELLBUTRIN XL) 150 MG 24 hr tablet Take 150 mg by mouth daily.     . Continuous Blood Gluc Sensor (DEXCOM G6 SENSOR) MISC Inject 1 Device  into the skin every 14 (fourteen) days.     . Continuous Blood Gluc Transmit (DEXCOM G6 TRANSMITTER) MISC See admin instructions.    . cyclobenzaprine (FLEXERIL) 10 MG tablet Take 10 mg by mouth 2 (two) times daily as needed for muscle spasms.    . Dulaglutide (TRULICITY) 1.5 LM/7.8ML SOPN Inject 1.5 mg into the skin once a week. (Patient taking differently: Inject 1.5 mg into the skin every Monday. ) 2 mL 2  . DULoxetine (CYMBALTA) 60 MG capsule Take 1 capsule (60 mg total) by mouth daily. 30 capsule 3  . ferrous gluconate (FERGON) 324 MG tablet Take 324 mg by mouth daily with breakfast.    . fexofenadine (ALLEGRA) 180 MG tablet Take 180 mg by mouth daily.    Marland Kitchen gabapentin (NEURONTIN) 800 MG tablet Take 800 mg by mouth in the morning, at noon, and at bedtime.     Marland Kitchen glipiZIDE (GLUCOTROL) 10 MG tablet Take 1 tablet (10 mg total) by mouth 2 (two) times daily before a meal. 60 tablet 3  . glucose blood (TRUE METRIX BLOOD GLUCOSE TEST) test strip Use as instructed (Patient taking differently: 1 each by Other route as directed. ) 100 each 12  . hydrOXYzine (ATARAX/VISTARIL) 10 MG tablet Take by mouth.    Marland Kitchen ibuprofen (ADVIL) 800 MG tablet Take 1 tablet (800 mg total) by mouth every 8 (eight) hours as needed. 30 tablet 0  . indomethacin (INDOCIN) 50 MG capsule Take 50 mg by mouth 2 (two) times daily with a meal.    . insulin glargine (LANTUS SOLOSTAR) 100 UNIT/ML Solostar  Pen Inject 50 Units into the skin daily. (Patient taking differently: Inject 50 Units into the skin at bedtime. ) 15 mL 2  . Insulin Pen Needle (TRUEPLUS PEN NEEDLES) 32G X 4 MM MISC Use as instructed. (Patient taking differently: 1 each by Other route as directed. ) 100 each 6  . lisinopril (ZESTRIL) 20 MG tablet Take 20 mg by mouth daily.    . meloxicam (MOBIC) 15 MG tablet Take 15 mg by mouth daily.    . metFORMIN (GLUCOPHAGE) 500 MG tablet Take 1 tablet (500 mg total) by mouth 2 (two) times daily with a meal. 60 tablet 2  .  pantoprazole (PROTONIX) 40 MG tablet Take 1 tablet (40 mg total) by mouth daily. (Patient taking differently: Take 40 mg by mouth daily before breakfast. ) 30 tablet 2  . sulfamethoxazole-trimethoprim (BACTRIM) 400-80 MG tablet Take 1 tablet by mouth 2 (two) times daily. 28 tablet 0  . topiramate (TOPAMAX) 25 MG tablet Take 25 mg by mouth at bedtime.     . TRUEplus Lancets 28G MISC 1 each by Does not apply route 2 (two) times a day. 100 each 1  . Vitamin D, Ergocalciferol, (DRISDOL) 1.25 MG (50000 UNIT) CAPS capsule Take 50,000 Units by mouth every Monday.     . [DISCONTINUED] sodium chloride (OCEAN) 0.65 % SOLN nasal spray Place 1 spray into both nostrils as needed for congestion. (Patient not taking: Reported on 03/13/2019) 1 Bottle 0   No current facility-administered medications on file prior to visit.    No Known Allergies  Objective: There were no vitals filed for this visit.  General: No acute distress, AAOx3  Left foot: Sutures and staples intact with granular tissue at incision but no dehiscence at surgical site, mild swelling to left foot, no erythema, no warmth, bloody drainage with mild maceration, no signs of infection noted, Capillary fill time <3 seconds in all remaining digits, gross sensation present via light touch to left but protective is diminished like previous.  No pain or crepitation with range of motion left foot.  No pain with calf compression.   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 ([place])       Status post left foot surgery       Diabetic polyneuropathy associated with type 2 diabetes mellitus ([place])           -Patient seen and evaluated -Staples removed, sutures left in place -Applied Betadine, PRISMA, and dry sterile dressing to surgical site left foot secured with ACE wrap and stockinet  -Advised  patient to make sure to keep dressings clean, dry, and intact to left surgical site -Advised patient to continue with post-op shoe on left foot with use of cane for stability -Advised patient to limit activity to necessity  -Advised patient to ice and elevate as necessary  -Continue with Norco for pain; refill provided  -Will plan for possible sutures at next office visit. In the meantime, patient to call office if any issues or problems arise.   Landis Martins, DPM

## 2020-04-03 ENCOUNTER — Ambulatory Visit (INDEPENDENT_AMBULATORY_CARE_PROVIDER_SITE_OTHER): Payer: 59 | Admitting: Sports Medicine

## 2020-04-03 ENCOUNTER — Ambulatory Visit: Payer: 59 | Admitting: Sports Medicine

## 2020-04-03 ENCOUNTER — Other Ambulatory Visit: Payer: Self-pay

## 2020-04-03 ENCOUNTER — Encounter: Payer: Self-pay | Admitting: Sports Medicine

## 2020-04-03 DIAGNOSIS — E1142 Type 2 diabetes mellitus with diabetic polyneuropathy: Secondary | ICD-10-CM

## 2020-04-03 DIAGNOSIS — S98132A Complete traumatic amputation of one left lesser toe, initial encounter: Secondary | ICD-10-CM

## 2020-04-03 DIAGNOSIS — Z9889 Other specified postprocedural states: Secondary | ICD-10-CM

## 2020-04-03 DIAGNOSIS — M869 Osteomyelitis, unspecified: Secondary | ICD-10-CM

## 2020-04-03 NOTE — Progress Notes (Signed)
Subjective: Maria Morris is a 39 y.o. female patient seen today in office for POV #4(DOS 03/08/2020), S/P left fifth toe amputation. Patient admits pain at surgical site off and on, denies headache, chest pain, shortness of breath, nausea, vomiting, fever, or chills. No other issues noted.   Patient Active Problem List   Diagnosis Date Noted  . Sepsis ([place]) 03/07/2020  . AKI (acute kidney injury) ([place]) 03/07/2020  . DM II (diabetes mellitus, type II), controlled ([place]) 03/06/2020  . Osteomyelitis ([place]) 03/05/2020  . Elevated C-reactive protein (CRP) 01/29/2020  . Elevated sed rate 01/29/2020  . Gastroesophageal reflux disease 01/29/2020  . Myalgia 01/29/2020  . Seasonal allergic rhinitis due to pollen 01/29/2020  . Photosensitivity dermatitis 12/26/2019  . Hypertension associated with diabetes ([place]) 11/27/2019  . Amenorrhea 11/14/2019  . Class 3 severe obesity due to excess calories with serious comorbidity and body mass index (BMI) of 50.0 to 59.9 in adult ([place]) 11/14/2019  . GAD (generalized anxiety disorder) 11/14/2019  . Moderate episode of recurrent major depressive disorder ([place]) 11/14/2019  . Palpitations 11/14/2019  . Polyarthralgia 11/14/2019  . Well woman exam with routine gynecological exam 07/26/2019  . Breast pain, left 07/26/2019    Current Outpatient Medications on File Prior to Visit  Medication Sig Dispense Refill  . acetaminophen (TYLENOL) 500 MG tablet Take 1,000-2,000 mg by mouth 3 (three) times daily as needed for headache (migraine).     Marland Kitchen aspirin-acetaminophen-caffeine (EXCEDRIN MIGRAINE) 250-250-65 MG tablet Take 2 tablets by mouth every 6 (six) hours as needed for headache or migraine.    . Blood Glucose Monitoring Suppl (TRUE METRIX METER) w/Device KIT 1 each by Does not apply route 2 (two) times a day. 1 kit 0  . buPROPion (WELLBUTRIN XL) 150 MG 24 hr tablet Take 150 mg by mouth daily.     . Continuous Blood Gluc Sensor (DEXCOM G6 SENSOR) MISC Inject 1  Device into the skin every 14 (fourteen) days.     . Continuous Blood Gluc Transmit (DEXCOM G6 TRANSMITTER) MISC See admin instructions.    . cyclobenzaprine (FLEXERIL) 10 MG tablet Take 10 mg by mouth 2 (two) times daily as needed for muscle spasms.    . Dulaglutide (TRULICITY) 1.5 NF/6.2ZH SOPN Inject 1.5 mg into the skin once a week. (Patient taking differently: Inject 1.5 mg into the skin every Monday. ) 2 mL 2  . DULoxetine (CYMBALTA) 60 MG capsule Take 1 capsule (60 mg total) by mouth daily. 30 capsule 3  . ferrous gluconate (FERGON) 324 MG tablet Take 324 mg by mouth daily with breakfast.    . fexofenadine (ALLEGRA) 180 MG tablet Take 180 mg by mouth daily.    Marland Kitchen gabapentin (NEURONTIN) 800 MG tablet Take 800 mg by mouth in the morning, at noon, and at bedtime.     Marland Kitchen glipiZIDE (GLUCOTROL) 10 MG tablet Take 1 tablet (10 mg total) by mouth 2 (two) times daily before a meal. 60 tablet 3  . glucose blood (TRUE METRIX BLOOD GLUCOSE TEST) test strip Use as instructed (Patient taking differently: 1 each by Other route as directed. ) 100 each 12  . HYDROcodone-acetaminophen (NORCO) 10-325 MG tablet Take by mouth.    . hydrOXYzine (ATARAX/VISTARIL) 10 MG tablet Take by mouth.    Marland Kitchen ibuprofen (ADVIL) 800 MG tablet Take 1 tablet (800 mg total) by mouth every 8 (eight) hours as needed. 30 tablet 0  . indomethacin (INDOCIN) 50 MG capsule Take 50 mg by mouth 2 (two) times daily  with a meal.    . insulin glargine (LANTUS SOLOSTAR) 100 UNIT/ML Solostar Pen Inject 50 Units into the skin daily. (Patient taking differently: Inject 50 Units into the skin at bedtime. ) 15 mL 2  . Insulin Pen Needle (TRUEPLUS PEN NEEDLES) 32G X 4 MM MISC Use as instructed. (Patient taking differently: 1 each by Other route as directed. ) 100 each 6  . lisinopril (ZESTRIL) 20 MG tablet Take 20 mg by mouth daily.    . meloxicam (MOBIC) 15 MG tablet Take 15 mg by mouth daily.    . metFORMIN (GLUCOPHAGE) 500 MG tablet Take 1 tablet (500  mg total) by mouth 2 (two) times daily with a meal. 60 tablet 2  . pantoprazole (PROTONIX) 40 MG tablet Take 1 tablet (40 mg total) by mouth daily. (Patient taking differently: Take 40 mg by mouth daily before breakfast. ) 30 tablet 2  . sulfamethoxazole-trimethoprim (BACTRIM) 400-80 MG tablet Take 1 tablet by mouth 2 (two) times daily. 28 tablet 0  . topiramate (TOPAMAX) 25 MG tablet Take 25 mg by mouth at bedtime.     . TRUEplus Lancets 28G MISC 1 each by Does not apply route 2 (two) times a day. 100 each 1  . Vitamin D, Ergocalciferol, (DRISDOL) 1.25 MG (50000 UNIT) CAPS capsule Take 50,000 Units by mouth every Monday.     . [DISCONTINUED] sodium chloride (OCEAN) 0.65 % SOLN nasal spray Place 1 spray into both nostrils as needed for congestion. (Patient not taking: Reported on 03/13/2019) 1 Bottle 0   No current facility-administered medications on file prior to visit.    No Known Allergies  Objective: There were no vitals filed for this visit.  General: No acute distress, AAOx3  Left foot: Sutures  intact with fibrogranular tissue at poximial incision but no dehiscence at surgical site, mild swelling to left foot, no erythema, no warmth, bloody drainage with mild maceration, no signs of infection noted, Capillary fill time <3 seconds in all remaining digits, gross sensation present via light touch to left but protective is diminished like previous.  No pain or crepitation with range of motion left foot.  No pain with calf compression.   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 ([place])       Status post left foot surgery       Diabetic polyneuropathy associated with type 2 diabetes mellitus ([place])           -Patient seen and evaluated -A few sutures were removed -Applied Betadine and dry sterile dressing to surgical site left foot  secured with ACE wrap and stockinet  -Advised patient to make sure to keep dressings clean, dry, and intact to left surgical site -Advised patient to continue with post-op shoe on left foot with use of cane for stability -Advised patient to limit activity to necessity  -Advised patient to ice and elevate as necessary  -Continue with Norco for pain; call if need refill -Will plan for possibly finishing sutures at next office visit. In the meantime, patient to call office if any issues or problems arise.   Landis Martins, DPM

## 2020-04-04 ENCOUNTER — Other Ambulatory Visit: Payer: Self-pay | Admitting: Sports Medicine

## 2020-04-04 ENCOUNTER — Telehealth: Payer: Self-pay | Admitting: Sports Medicine

## 2020-04-04 DIAGNOSIS — Z9889 Other specified postprocedural states: Secondary | ICD-10-CM

## 2020-04-04 MED ORDER — HYDROCODONE-ACETAMINOPHEN 10-325 MG PO TABS: 1.0000 | ORAL_TABLET | Freq: Four times a day (QID) | ORAL | 0 refills | Status: AC | PRN

## 2020-04-04 NOTE — Progress Notes (Signed)
Refilled pain med 

## 2020-04-04 NOTE — Telephone Encounter (Signed)
Refill sent.

## 2020-04-04 NOTE — Telephone Encounter (Signed)
Pt requesting pain medication refill. 

## 2020-04-10 ENCOUNTER — Ambulatory Visit (INDEPENDENT_AMBULATORY_CARE_PROVIDER_SITE_OTHER): Payer: 59 | Admitting: Sports Medicine

## 2020-04-10 ENCOUNTER — Encounter: Payer: Self-pay | Admitting: Sports Medicine

## 2020-04-10 ENCOUNTER — Encounter: Payer: 59 | Admitting: Sports Medicine

## 2020-04-10 ENCOUNTER — Other Ambulatory Visit: Payer: Self-pay

## 2020-04-10 DIAGNOSIS — Z9889 Other specified postprocedural states: Secondary | ICD-10-CM

## 2020-04-10 DIAGNOSIS — E1142 Type 2 diabetes mellitus with diabetic polyneuropathy: Secondary | ICD-10-CM

## 2020-04-10 DIAGNOSIS — S98132A Complete traumatic amputation of one left lesser toe, initial encounter: Secondary | ICD-10-CM

## 2020-04-10 DIAGNOSIS — M869 Osteomyelitis, unspecified: Secondary | ICD-10-CM

## 2020-04-10 NOTE — Progress Notes (Signed)
Subjective: Maria Morris is a 39 y.o. female patient seen today in office for POV # 5 (DOS 03/08/2020), S/P left fifth toe amputation. Patient admits pain at surgical site that increased slightly after her dog jumped on her foot but otherwise doing better, denies headache, chest pain, shortness of breath, nausea, vomiting, fever, or chills. No other issues noted.   Patient Active Problem List   Diagnosis Date Noted  . Sepsis ([place]) 03/07/2020  . AKI (acute kidney injury) ([place]) 03/07/2020  . DM II (diabetes mellitus, type II), controlled ([place]) 03/06/2020  . Osteomyelitis ([place]) 03/05/2020  . Elevated C-reactive protein (CRP) 01/29/2020  . Elevated sed rate 01/29/2020  . Gastroesophageal reflux disease 01/29/2020  . Myalgia 01/29/2020  . Seasonal allergic rhinitis due to pollen 01/29/2020  . Photosensitivity dermatitis 12/26/2019  . Hypertension associated with diabetes ([place]) 11/27/2019  . Amenorrhea 11/14/2019  . Class 3 severe obesity due to excess calories with serious comorbidity and body mass index (BMI) of 50.0 to 59.9 in adult ([place]) 11/14/2019  . GAD (generalized anxiety disorder) 11/14/2019  . Moderate episode of recurrent major depressive disorder ([place]) 11/14/2019  . Palpitations 11/14/2019  . Polyarthralgia 11/14/2019  . Well woman exam with routine gynecological exam 07/26/2019  . Breast pain, left 07/26/2019    Current Outpatient Medications on File Prior to Visit  Medication Sig Dispense Refill  . acetaminophen (TYLENOL) 500 MG tablet Take 1,000-2,000 mg by mouth 3 (three) times daily as needed for headache (migraine).     Marland Kitchen aspirin-acetaminophen-caffeine (EXCEDRIN MIGRAINE) 250-250-65 MG tablet Take 2 tablets by mouth every 6 (six) hours as needed for headache or migraine.    . Blood Glucose Monitoring Suppl (TRUE METRIX METER) w/Device KIT 1 each by Does not apply route 2 (two) times a day. 1 kit 0  . buPROPion (WELLBUTRIN XL) 150 MG 24 hr tablet Take 150 mg by mouth  daily.     . Continuous Blood Gluc Sensor (DEXCOM G6 SENSOR) MISC Inject 1 Device into the skin every 14 (fourteen) days.     . Continuous Blood Gluc Transmit (DEXCOM G6 TRANSMITTER) MISC See admin instructions.    . cyclobenzaprine (FLEXERIL) 10 MG tablet Take 10 mg by mouth 2 (two) times daily as needed for muscle spasms.    . Dulaglutide (TRULICITY) 1.5 VP/7.1GG SOPN Inject 1.5 mg into the skin once a week. (Patient taking differently: Inject 1.5 mg into the skin every Monday. ) 2 mL 2  . DULoxetine (CYMBALTA) 60 MG capsule Take 1 capsule (60 mg total) by mouth daily. 30 capsule 3  . ferrous gluconate (FERGON) 324 MG tablet Take 324 mg by mouth daily with breakfast.    . fexofenadine (ALLEGRA) 180 MG tablet Take 180 mg by mouth daily.    Marland Kitchen gabapentin (NEURONTIN) 800 MG tablet Take 800 mg by mouth in the morning, at noon, and at bedtime.     Marland Kitchen glipiZIDE (GLUCOTROL) 10 MG tablet Take 1 tablet (10 mg total) by mouth 2 (two) times daily before a meal. 60 tablet 3  . glucose blood (TRUE METRIX BLOOD GLUCOSE TEST) test strip Use as instructed (Patient taking differently: 1 each by Other route as directed. ) 100 each 12  . HYDROcodone-acetaminophen (NORCO) 10-325 MG tablet Take 1 tablet by mouth every 6 (six) hours as needed for up to 7 days. 28 tablet 0  . hydrOXYzine (ATARAX/VISTARIL) 10 MG tablet Take by mouth.    Marland Kitchen ibuprofen (ADVIL) 800 MG tablet Take 1 tablet (800 mg total)  by mouth every 8 (eight) hours as needed. 30 tablet 0  . indomethacin (INDOCIN) 50 MG capsule Take 50 mg by mouth 2 (two) times daily with a meal.    . insulin glargine (LANTUS SOLOSTAR) 100 UNIT/ML Solostar Pen Inject 50 Units into the skin daily. (Patient taking differently: Inject 50 Units into the skin at bedtime. ) 15 mL 2  . Insulin Pen Needle (TRUEPLUS PEN NEEDLES) 32G X 4 MM MISC Use as instructed. (Patient taking differently: 1 each by Other route as directed. ) 100 each 6  . lisinopril (ZESTRIL) 20 MG tablet Take 20 mg  by mouth daily.    . meloxicam (MOBIC) 15 MG tablet Take 15 mg by mouth daily.    . metFORMIN (GLUCOPHAGE) 500 MG tablet Take 1 tablet (500 mg total) by mouth 2 (two) times daily with a meal. 60 tablet 2  . pantoprazole (PROTONIX) 40 MG tablet Take 1 tablet (40 mg total) by mouth daily. (Patient taking differently: Take 40 mg by mouth daily before breakfast. ) 30 tablet 2  . sulfamethoxazole-trimethoprim (BACTRIM) 400-80 MG tablet Take 1 tablet by mouth 2 (two) times daily. 28 tablet 0  . topiramate (TOPAMAX) 25 MG tablet Take 25 mg by mouth at bedtime.     . TRUEplus Lancets 28G MISC 1 each by Does not apply route 2 (two) times a day. 100 each 1  . Vitamin D, Ergocalciferol, (DRISDOL) 1.25 MG (50000 UNIT) CAPS capsule Take 50,000 Units by mouth every Monday.     . [DISCONTINUED] sodium chloride (OCEAN) 0.65 % SOLN nasal spray Place 1 spray into both nostrils as needed for congestion. (Patient not taking: Reported on 03/13/2019) 1 Bottle 0   No current facility-administered medications on file prior to visit.    No Known Allergies  Objective: There were no vitals filed for this visit.  General: No acute distress, AAOx3  Left foot: Sutures  intact with fibrogranular tissue at poximial incision measures less than 1 cm, mild swelling to left foot, no erythema, no warmth, bloody drainage with mild maceration, no signs of infection noted, Capillary fill time <3 seconds in all remaining digits, gross sensation present via light touch to left but protective is diminished like previous.  No pain or crepitation with range of motion left foot.  No pain with calf compression.   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 ([place])       Diabetic polyneuropathy associated with type 2 diabetes mellitus ([place])            -Patient seen and evaluated -Remaining sutures left in place -Applied medihoney and dry sterile dressing to surgical site left foot secured with ACE wrap and stockinet  -Advised patient to do the same on next week since she is unable to come to her appointment with lifting weights today -Advised patient to continue with post-op shoe on left foot with use of cane for stability -Advised patient to limit activity to necessity  -Advised patient to ice and elevate as necessary  -Continue with Norco for pain; call if need refill -Will plan for x-ray, wound care and finishing sutures at next office visit. In the meantime, patient to call office if any issues or problems arise.   Landis Martins,  DPM

## 2020-04-16 ENCOUNTER — Other Ambulatory Visit: Payer: Self-pay | Admitting: Sports Medicine

## 2020-04-16 ENCOUNTER — Telehealth: Payer: Self-pay | Admitting: Sports Medicine

## 2020-04-16 MED ORDER — HYDROCODONE-ACETAMINOPHEN 10-325 MG PO TABS: 1.0000 | ORAL_TABLET | Freq: Four times a day (QID) | ORAL | 0 refills | Status: AC | PRN

## 2020-04-16 NOTE — Progress Notes (Signed)
Refilled pain meds 

## 2020-04-16 NOTE — Telephone Encounter (Signed)
Pt called requesting a refill of Hydrocodone.  Pharmacy is [facility] on [address]

## 2020-04-16 NOTE — Telephone Encounter (Signed)
Sent!

## 2020-04-17 ENCOUNTER — Encounter: Payer: 59 | Admitting: Sports Medicine

## 2020-04-24 ENCOUNTER — Ambulatory Visit (INDEPENDENT_AMBULATORY_CARE_PROVIDER_SITE_OTHER): Payer: 59 | Admitting: Sports Medicine

## 2020-04-24 ENCOUNTER — Other Ambulatory Visit: Payer: Self-pay

## 2020-04-24 ENCOUNTER — Encounter: Payer: Self-pay | Admitting: Sports Medicine

## 2020-04-24 ENCOUNTER — Ambulatory Visit (INDEPENDENT_AMBULATORY_CARE_PROVIDER_SITE_OTHER): Payer: 59

## 2020-04-24 DIAGNOSIS — M79672 Pain in left foot: Secondary | ICD-10-CM | POA: Diagnosis not present

## 2020-04-24 DIAGNOSIS — S98132A Complete traumatic amputation of one left lesser toe, initial encounter: Secondary | ICD-10-CM

## 2020-04-24 DIAGNOSIS — M869 Osteomyelitis, unspecified: Secondary | ICD-10-CM

## 2020-04-24 DIAGNOSIS — Z9889 Other specified postprocedural states: Secondary | ICD-10-CM

## 2020-04-24 DIAGNOSIS — E1142 Type 2 diabetes mellitus with diabetic polyneuropathy: Secondary | ICD-10-CM

## 2020-04-24 NOTE — Progress Notes (Signed)
Subjective: Maria Morris is a 39 y.o. female patient seen today in office for POV # 6 (DOS 03/08/2020), S/P left fifth toe amputation. Patient admits pain at surgical site that is doing better, denies headache, chest pain, shortness of breath, nausea, vomiting, fever, or chills. No other issues noted.   Patient is assisted by husband this visit.  Patient Active Problem List   Diagnosis Date Noted  . Sepsis ([facility]) 03/07/2020  . AKI (acute kidney injury) ([facility]) 03/07/2020  . DM II (diabetes mellitus, type II), controlled ([facility]) 03/06/2020  . Osteomyelitis ([facility]) 03/05/2020  . Elevated C-reactive protein (CRP) 01/29/2020  . Elevated sed rate 01/29/2020  . Gastroesophageal reflux disease 01/29/2020  . Myalgia 01/29/2020  . Seasonal allergic rhinitis due to pollen 01/29/2020  . Photosensitivity dermatitis 12/26/2019  . Hypertension associated with diabetes ([facility]) 11/27/2019  . Amenorrhea 11/14/2019  . Class 3 severe obesity due to excess calories with serious comorbidity and body mass index (BMI) of 50.0 to 59.9 in adult ([facility]) 11/14/2019  . GAD (generalized anxiety disorder) 11/14/2019  . Moderate episode of recurrent major depressive disorder ([facility]) 11/14/2019  . Palpitations 11/14/2019  . Polyarthralgia 11/14/2019  . Well woman exam with routine gynecological exam 07/26/2019  . Breast pain, left 07/26/2019    Current Outpatient Medications on File Prior to Visit  Medication Sig Dispense Refill  . acetaminophen (TYLENOL) 500 MG tablet Take 1,000-2,000 mg by mouth 3 (three) times daily as needed for headache (migraine).     . aspirin-acetaminophen-caffeine (EXCEDRIN MIGRAINE) 250-250-65 MG tablet Take 2 tablets by mouth every 6 (six) hours as needed for headache or migraine.    . Blood Glucose Monitoring Suppl (TRUE METRIX METER) w/Device KIT 1 each by Does not apply route 2 (two) times a day. 1 kit 0  . buPROPion (WELLBUTRIN XL) 150 MG 24 hr tablet Take 150 mg by mouth daily.     .  Continuous Blood Gluc Sensor (DEXCOM G6 SENSOR) MISC Inject 1 Device into the skin every 14 (fourteen) days.     . Continuous Blood Gluc Transmit (DEXCOM G6 TRANSMITTER) MISC See admin instructions.    . cyclobenzaprine (FLEXERIL) 10 MG tablet Take 10 mg by mouth 2 (two) times daily as needed for muscle spasms.    . Dulaglutide (TRULICITY) 1.5 MG/0.5ML SOPN Inject 1.5 mg into the skin once a week. (Patient taking differently: Inject 1.5 mg into the skin every Monday. ) 2 mL 2  . DULoxetine (CYMBALTA) 60 MG capsule Take 1 capsule (60 mg total) by mouth daily. 30 capsule 3  . ferrous gluconate (FERGON) 324 MG tablet Take 324 mg by mouth daily with breakfast.    . fexofenadine (ALLEGRA) 180 MG tablet Take 180 mg by mouth daily.    . gabapentin (NEURONTIN) 800 MG tablet Take 800 mg by mouth in the morning, at noon, and at bedtime.     . glipiZIDE (GLUCOTROL) 10 MG tablet Take 1 tablet (10 mg total) by mouth 2 (two) times daily before a meal. 60 tablet 3  . glucose blood (TRUE METRIX BLOOD GLUCOSE TEST) test strip Use as instructed (Patient taking differently: 1 each by Other route as directed. ) 100 each 12  . hydrOXYzine (ATARAX/VISTARIL) 10 MG tablet Take by mouth.    . ibuprofen (ADVIL) 800 MG tablet Take 1 tablet (800 mg total) by mouth every 8 (eight) hours as needed. 30 tablet 0  . indomethacin (INDOCIN) 50 MG capsule Take 50 mg by mouth 2 (two) times daily with   a meal.    . insulin glargine (LANTUS SOLOSTAR) 100 UNIT/ML Solostar Pen Inject 50 Units into the skin daily. (Patient taking differently: Inject 50 Units into the skin at bedtime. ) 15 mL 2  . Insulin Pen Needle (TRUEPLUS PEN NEEDLES) 32G X 4 MM MISC Use as instructed. (Patient taking differently: 1 each by Other route as directed. ) 100 each 6  . lisinopril (ZESTRIL) 20 MG tablet Take 20 mg by mouth daily.    . meloxicam (MOBIC) 15 MG tablet Take 15 mg by mouth daily.    . metFORMIN (GLUCOPHAGE) 500 MG tablet Take 1 tablet (500 mg total)  by mouth 2 (two) times daily with a meal. 60 tablet 2  . pantoprazole (PROTONIX) 40 MG tablet Take 1 tablet (40 mg total) by mouth daily. (Patient taking differently: Take 40 mg by mouth daily before breakfast. ) 30 tablet 2  . sulfamethoxazole-trimethoprim (BACTRIM) 400-80 MG tablet Take 1 tablet by mouth 2 (two) times daily. 28 tablet 0  . topiramate (TOPAMAX) 25 MG tablet Take 25 mg by mouth at bedtime.     . TRUEplus Lancets 28G MISC 1 each by Does not apply route 2 (two) times a day. 100 each 1  . Vitamin D, Ergocalciferol, (DRISDOL) 1.25 MG (50000 UNIT) CAPS capsule Take 50,000 Units by mouth every Monday.     . [DISCONTINUED] sodium chloride (OCEAN) 0.65 % SOLN nasal spray Place 1 spray into both nostrils as needed for congestion. (Patient not taking: Reported on 03/13/2019) 1 Bottle 0   No current facility-administered medications on file prior to visit.    No Known Allergies  Objective: There were no vitals filed for this visit.  General: No acute distress, AAOx3  Left foot: Sutures  intact with fibrogranular tissue at poximial incision measures less than 0.5 cm, mild swelling to left foot, significant dry skin plantar surface of foot, no erythema, no warmth, bloody drainage with mild maceration, no signs of infection noted, Capillary fill time <3 seconds in all remaining digits, gross sensation present via light touch to left but protective is diminished like previous.  No pain or crepitation with range of motion left foot.  No pain with calf compression.   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 ([facility])       Diabetic polyneuropathy associated with type 2 diabetes mellitus ([facility])           -Patient seen and evaluated -X-rays reviewed and  consistent with postoperative status -Remaining sutures were removed -Applied medihoney and dry sterile dressing to surgical site left foot secured with ACE wrap and stockinet  -Advised patient to do the same on next week -Advised patient to continue with post-op shoe on left foot with use of cane for stability -Advised patient to limit activity to necessity  -Advised patient to ice and elevate as necessary  -Continue with Norco for pain; call if need refill like before -Will plan for follow-up postoperative wound care as scheduled in 2 weeks.  , DPM  

## 2020-05-01 ENCOUNTER — Other Ambulatory Visit: Payer: Self-pay | Admitting: Sports Medicine

## 2020-05-01 ENCOUNTER — Telehealth: Payer: Self-pay

## 2020-05-01 MED ORDER — HYDROCODONE-ACETAMINOPHEN 5-325 MG PO TABS: 1.0000 | ORAL_TABLET | Freq: Four times a day (QID) | ORAL | 0 refills | Status: AC | PRN

## 2020-05-01 NOTE — Telephone Encounter (Signed)
Refill sent.

## 2020-05-01 NOTE — Progress Notes (Signed)
Refilled pain medication lowered dose to wean

## 2020-05-01 NOTE — Telephone Encounter (Signed)
Pt would like a refill on her pain medication.  

## 2020-05-08 ENCOUNTER — Ambulatory Visit (INDEPENDENT_AMBULATORY_CARE_PROVIDER_SITE_OTHER): Payer: 59 | Admitting: Sports Medicine

## 2020-05-08 ENCOUNTER — Encounter: Payer: Self-pay | Admitting: Sports Medicine

## 2020-05-08 ENCOUNTER — Other Ambulatory Visit: Payer: Self-pay

## 2020-05-08 DIAGNOSIS — E1142 Type 2 diabetes mellitus with diabetic polyneuropathy: Secondary | ICD-10-CM

## 2020-05-08 DIAGNOSIS — Z9889 Other specified postprocedural states: Secondary | ICD-10-CM

## 2020-05-08 NOTE — Progress Notes (Signed)
Subjective: Maria Morris is a 39 y.o. female patient seen today in office for POV # 7 (DOS 03/08/2020), S/P left fifth toe amputation. Patient states that she is doing ok with occassional pain and states that the foot cream has helped with the dry skin, patient denies headache, chest pain, shortness of breath, nausea, vomiting, fever, or chills. No other issues noted.   Patient is assisted by husband this visit.  Patient Active Problem List   Diagnosis Date Noted  . Sepsis ([place]) 03/07/2020  . AKI (acute kidney injury) ([place]) 03/07/2020  . DM II (diabetes mellitus, type II), controlled ([place]) 03/06/2020  . Osteomyelitis ([place]) 03/05/2020  . Elevated C-reactive protein (CRP) 01/29/2020  . Elevated sed rate 01/29/2020  . Gastroesophageal reflux disease 01/29/2020  . Myalgia 01/29/2020  . Seasonal allergic rhinitis due to pollen 01/29/2020  . Photosensitivity dermatitis 12/26/2019  . Hypertension associated with diabetes ([place]) 11/27/2019  . Amenorrhea 11/14/2019  . Class 3 severe obesity due to excess calories with serious comorbidity and body mass index (BMI) of 50.0 to 59.9 in adult ([place]) 11/14/2019  . GAD (generalized anxiety disorder) 11/14/2019  . Moderate episode of recurrent major depressive disorder ([place]) 11/14/2019  . Palpitations 11/14/2019  . Polyarthralgia 11/14/2019  . Well woman exam with routine gynecological exam 07/26/2019  . Breast pain, left 07/26/2019    Current Outpatient Medications on File Prior to Visit  Medication Sig Dispense Refill  . acetaminophen (TYLENOL) 500 MG tablet Take 1,000-2,000 mg by mouth 3 (three) times daily as needed for headache (migraine).     Marland Kitchen aspirin-acetaminophen-caffeine (EXCEDRIN MIGRAINE) 250-250-65 MG tablet Take 2 tablets by mouth every 6 (six) hours as needed for headache or migraine.    . Blood Glucose Monitoring Suppl (TRUE METRIX METER) w/Device KIT 1 each by Does not apply route 2 (two) times a day. 1 kit 0  . buPROPion  (WELLBUTRIN XL) 150 MG 24 hr tablet Take 150 mg by mouth daily.     . Continuous Blood Gluc Sensor (DEXCOM G6 SENSOR) MISC Inject 1 Device into the skin every 14 (fourteen) days.     . Continuous Blood Gluc Transmit (DEXCOM G6 TRANSMITTER) MISC See admin instructions.    . cyclobenzaprine (FLEXERIL) 10 MG tablet Take 10 mg by mouth 2 (two) times daily as needed for muscle spasms.    . Dulaglutide (TRULICITY) 1.5 XT/0.2IO SOPN Inject 1.5 mg into the skin once a week. (Patient taking differently: Inject 1.5 mg into the skin every Monday. ) 2 mL 2  . DULoxetine (CYMBALTA) 60 MG capsule Take 1 capsule (60 mg total) by mouth daily. 30 capsule 3  . ferrous gluconate (FERGON) 324 MG tablet Take 324 mg by mouth daily with breakfast.    . fexofenadine (ALLEGRA) 180 MG tablet Take 180 mg by mouth daily.    Marland Kitchen gabapentin (NEURONTIN) 800 MG tablet Take 800 mg by mouth in the morning, at noon, and at bedtime.     Marland Kitchen glipiZIDE (GLUCOTROL) 10 MG tablet Take 1 tablet (10 mg total) by mouth 2 (two) times daily before a meal. 60 tablet 3  . glucose blood (TRUE METRIX BLOOD GLUCOSE TEST) test strip Use as instructed (Patient taking differently: 1 each by Other route as directed. ) 100 each 12  . HYDROcodone-acetaminophen (NORCO) 5-325 MG tablet Take 1 tablet by mouth every 6 (six) hours as needed for up to 7 days for moderate pain. 28 tablet 0  . hydrOXYzine (ATARAX/VISTARIL) 10 MG tablet Take by mouth.    Marland Kitchen  ibuprofen (ADVIL) 800 MG tablet Take 1 tablet (800 mg total) by mouth every 8 (eight) hours as needed. 30 tablet 0  . indomethacin (INDOCIN) 50 MG capsule Take 50 mg by mouth 2 (two) times daily with a meal.    . insulin glargine (LANTUS SOLOSTAR) 100 UNIT/ML Solostar Pen Inject 50 Units into the skin daily. (Patient taking differently: Inject 50 Units into the skin at bedtime. ) 15 mL 2  . Insulin Pen Needle (TRUEPLUS PEN NEEDLES) 32G X 4 MM MISC Use as instructed. (Patient taking differently: 1 each by Other route  as directed. ) 100 each 6  . lisinopril (ZESTRIL) 20 MG tablet Take 20 mg by mouth daily.    . meloxicam (MOBIC) 15 MG tablet Take 15 mg by mouth daily.    . metFORMIN (GLUCOPHAGE) 500 MG tablet Take 1 tablet (500 mg total) by mouth 2 (two) times daily with a meal. 60 tablet 2  . pantoprazole (PROTONIX) 40 MG tablet Take 1 tablet (40 mg total) by mouth daily. (Patient taking differently: Take 40 mg by mouth daily before breakfast. ) 30 tablet 2  . sulfamethoxazole-trimethoprim (BACTRIM) 400-80 MG tablet Take 1 tablet by mouth 2 (two) times daily. 28 tablet 0  . topiramate (TOPAMAX) 25 MG tablet Take 25 mg by mouth at bedtime.     . TRUEplus Lancets 28G MISC 1 each by Does not apply route 2 (two) times a day. 100 each 1  . Vitamin D, Ergocalciferol, (DRISDOL) 1.25 MG (50000 UNIT) CAPS capsule Take 50,000 Units by mouth every Monday.     . [DISCONTINUED] sodium chloride (OCEAN) 0.65 % SOLN nasal spray Place 1 spray into both nostrils as needed for congestion. (Patient not taking: Reported on 03/13/2019) 1 Bottle 0   No current facility-administered medications on file prior to visit.    No Known Allergies  Objective: There were no vitals filed for this visit.  General: No acute distress, AAOx3  Left foot: Incision healed with dry scab to area, there is improving dry skin plantar surface of foot, no erythema, no warmth, bloody drainage with mild maceration, no signs of infection noted, Capillary fill time <3 seconds in all remaining digits, gross sensation present via light touch to left but protective is diminished like previous.  No pain or crepitation with range of motion left foot.  No pain with calf compression.   Assessment and Plan:  Problem List Items Addressed This Visit    None    Visit Diagnoses    Status post left foot surgery    -  Primary   Diabetic polyneuropathy associated with type 2 diabetes mellitus ([place])           -Patient seen and evaluated -Applied protective  dressing of medihoney and guaze to the left foot and advised patient to only change if dressings come off -May shower on next week and then redress with a clean sock -Advised patient to continue with post-op shoe on left foot with use of cane for stability like before -Advised patient to limit activity to necessity  -Advised patient to ice and elevate as necessary  -Continue with Norco for pain; call if need refill like before -Will plan for follow-up postoperative wound check and xrays as scheduled in 2 weeks  Landis Martins, DPM

## 2020-05-20 ENCOUNTER — Telehealth: Payer: Self-pay

## 2020-05-20 ENCOUNTER — Other Ambulatory Visit: Payer: Self-pay | Admitting: Sports Medicine

## 2020-05-20 MED ORDER — HYDROCODONE-ACETAMINOPHEN 5-325 MG PO TABS: 1.0000 | ORAL_TABLET | Freq: Four times a day (QID) | ORAL | 0 refills | Status: AC | PRN

## 2020-05-20 NOTE — Telephone Encounter (Signed)
Pt needs a refill on medication. Please advise  

## 2020-05-20 NOTE — Progress Notes (Signed)
Refilled pain meds 

## 2020-05-22 ENCOUNTER — Encounter: Payer: Self-pay | Admitting: Sports Medicine

## 2020-05-22 ENCOUNTER — Ambulatory Visit (INDEPENDENT_AMBULATORY_CARE_PROVIDER_SITE_OTHER): Payer: 59 | Admitting: Sports Medicine

## 2020-05-22 ENCOUNTER — Other Ambulatory Visit: Payer: Self-pay

## 2020-05-22 DIAGNOSIS — S98132A Complete traumatic amputation of one left lesser toe, initial encounter: Secondary | ICD-10-CM

## 2020-05-22 DIAGNOSIS — Z9889 Other specified postprocedural states: Secondary | ICD-10-CM

## 2020-05-22 DIAGNOSIS — M869 Osteomyelitis, unspecified: Secondary | ICD-10-CM

## 2020-05-22 DIAGNOSIS — E1142 Type 2 diabetes mellitus with diabetic polyneuropathy: Secondary | ICD-10-CM

## 2020-05-22 NOTE — Progress Notes (Signed)
Subjective: Maria Morris is a 39 y.o. female patient seen today in office for POV # 8 (DOS 03/08/2020), S/P left fifth toe amputation. Patient states that she is doing good and is ready to go back to work, patient denies headache, chest pain, shortness of breath, nausea, vomiting, fever, or chills. No other issues noted.   Patient is assisted by husband this visit.  Patient Active Problem List   Diagnosis Date Noted  . Sepsis ([place]) 03/07/2020  . AKI (acute kidney injury) ([place]) 03/07/2020  . DM II (diabetes mellitus, type II), controlled ([place]) 03/06/2020  . Osteomyelitis ([place]) 03/05/2020  . Elevated C-reactive protein (CRP) 01/29/2020  . Elevated sed rate 01/29/2020  . Gastroesophageal reflux disease 01/29/2020  . Myalgia 01/29/2020  . Seasonal allergic rhinitis due to pollen 01/29/2020  . Photosensitivity dermatitis 12/26/2019  . Hypertension associated with diabetes ([place]) 11/27/2019  . Amenorrhea 11/14/2019  . Class 3 severe obesity due to excess calories with serious comorbidity and body mass index (BMI) of 50.0 to 59.9 in adult ([place]) 11/14/2019  . GAD (generalized anxiety disorder) 11/14/2019  . Moderate episode of recurrent major depressive disorder ([place]) 11/14/2019  . Palpitations 11/14/2019  . Polyarthralgia 11/14/2019  . Well woman exam with routine gynecological exam 07/26/2019  . Breast pain, left 07/26/2019    Current Outpatient Medications on File Prior to Visit  Medication Sig Dispense Refill  . acetaminophen (TYLENOL) 500 MG tablet Take 1,000-2,000 mg by mouth 3 (three) times daily as needed for headache (migraine).     Marland Kitchen aspirin-acetaminophen-caffeine (EXCEDRIN MIGRAINE) 250-250-65 MG tablet Take 2 tablets by mouth every 6 (six) hours as needed for headache or migraine.    . Blood Glucose Monitoring Suppl (TRUE METRIX METER) w/Device KIT 1 each by Does not apply route 2 (two) times a day. 1 kit 0  . buPROPion (WELLBUTRIN XL) 150 MG 24 hr tablet Take 150 mg by  mouth daily.     . Continuous Blood Gluc Sensor (DEXCOM G6 SENSOR) MISC Inject 1 Device into the skin every 14 (fourteen) days.     . Continuous Blood Gluc Transmit (DEXCOM G6 TRANSMITTER) MISC See admin instructions.    . cyclobenzaprine (FLEXERIL) 10 MG tablet Take 10 mg by mouth 2 (two) times daily as needed for muscle spasms.    . Dulaglutide (TRULICITY) 1.5 OZ/2.2QM SOPN Inject 1.5 mg into the skin once a week. (Patient taking differently: Inject 1.5 mg into the skin every Monday. ) 2 mL 2  . DULoxetine (CYMBALTA) 60 MG capsule Take 1 capsule (60 mg total) by mouth daily. 30 capsule 3  . ferrous gluconate (FERGON) 324 MG tablet Take 324 mg by mouth daily with breakfast.    . fexofenadine (ALLEGRA) 180 MG tablet Take 180 mg by mouth daily.    . fluticasone (FLONASE) 50 MCG/ACT nasal spray SMARTSIG:1 Spray(s) Both Nares Daily PRN    . gabapentin (NEURONTIN) 800 MG tablet Take 800 mg by mouth in the morning, at noon, and at bedtime.     Marland Kitchen glipiZIDE (GLUCOTROL) 10 MG tablet Take 1 tablet (10 mg total) by mouth 2 (two) times daily before a meal. 60 tablet 3  . glucose blood (TRUE METRIX BLOOD GLUCOSE TEST) test strip Use as instructed (Patient taking differently: 1 each by Other route as directed. ) 100 each 12  . HYDROcodone-acetaminophen (NORCO) 5-325 MG tablet Take 1 tablet by mouth every 6 (six) hours as needed for up to 7 days for moderate pain. 28 tablet 0  .  hydrOXYzine (ATARAX/VISTARIL) 10 MG tablet Take by mouth.    Marland Kitchen ibuprofen (ADVIL) 800 MG tablet Take 1 tablet (800 mg total) by mouth every 8 (eight) hours as needed. 30 tablet 0  . indomethacin (INDOCIN) 50 MG capsule Take 50 mg by mouth 2 (two) times daily with a meal.    . insulin glargine (LANTUS SOLOSTAR) 100 UNIT/ML Solostar Pen Inject 50 Units into the skin daily. (Patient taking differently: Inject 50 Units into the skin at bedtime. ) 15 mL 2  . Insulin Pen Needle (TRUEPLUS PEN NEEDLES) 32G X 4 MM MISC Use as instructed. (Patient  taking differently: 1 each by Other route as directed. ) 100 each 6  . lisinopril (ZESTRIL) 20 MG tablet Take 20 mg by mouth daily.    . meloxicam (MOBIC) 15 MG tablet Take 15 mg by mouth daily.    . metFORMIN (GLUCOPHAGE) 500 MG tablet Take 1 tablet (500 mg total) by mouth 2 (two) times daily with a meal. 60 tablet 2  . montelukast (SINGULAIR) 10 MG tablet Take 10 mg by mouth daily.    . pantoprazole (PROTONIX) 40 MG tablet Take 1 tablet (40 mg total) by mouth daily. (Patient taking differently: Take 40 mg by mouth daily before breakfast. ) 30 tablet 2  . sulfamethoxazole-trimethoprim (BACTRIM) 400-80 MG tablet Take 1 tablet by mouth 2 (two) times daily. 28 tablet 0  . topiramate (TOPAMAX) 25 MG tablet Take 25 mg by mouth at bedtime.     . TRUEplus Lancets 28G MISC 1 each by Does not apply route 2 (two) times a day. 100 each 1  . Vitamin D, Ergocalciferol, (DRISDOL) 1.25 MG (50000 UNIT) CAPS capsule Take 50,000 Units by mouth every Monday.     . [DISCONTINUED] sodium chloride (OCEAN) 0.65 % SOLN nasal spray Place 1 spray into both nostrils as needed for congestion. (Patient not taking: Reported on 03/13/2019) 1 Bottle 0   No current facility-administered medications on file prior to visit.    No Known Allergies  Objective: There were no vitals filed for this visit.  General: No acute distress, AAOx3  Left foot: Incision healed with dry scab to area, there is improving dry skin plantar surface of foot, no erythema, no warmth, bloody drainage with mild maceration, no signs of infection noted, Capillary fill time <3 seconds in all remaining digits, gross sensation present via light touch to left but protective is diminished like previous.  No pain or crepitation with range of motion left foot.  No pain with calf compression.   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

## 2020-06-19 ENCOUNTER — Other Ambulatory Visit: Payer: Self-pay

## 2020-06-19 ENCOUNTER — Ambulatory Visit (INDEPENDENT_AMBULATORY_CARE_PROVIDER_SITE_OTHER): Payer: 59

## 2020-06-19 ENCOUNTER — Encounter: Payer: Self-pay | Admitting: Sports Medicine

## 2020-06-19 ENCOUNTER — Ambulatory Visit (INDEPENDENT_AMBULATORY_CARE_PROVIDER_SITE_OTHER): Payer: 59 | Admitting: Sports Medicine

## 2020-06-19 DIAGNOSIS — M869 Osteomyelitis, unspecified: Secondary | ICD-10-CM

## 2020-06-19 DIAGNOSIS — E1142 Type 2 diabetes mellitus with diabetic polyneuropathy: Secondary | ICD-10-CM

## 2020-06-19 DIAGNOSIS — L02611 Cutaneous abscess of right foot: Secondary | ICD-10-CM

## 2020-06-19 DIAGNOSIS — L03031 Cellulitis of right toe: Secondary | ICD-10-CM

## 2020-06-19 DIAGNOSIS — S98132A Complete traumatic amputation of one left lesser toe, initial encounter: Secondary | ICD-10-CM | POA: Diagnosis not present

## 2020-06-19 DIAGNOSIS — Z9889 Other specified postprocedural states: Secondary | ICD-10-CM

## 2020-06-19 DIAGNOSIS — S90426A Blister (nonthermal), unspecified lesser toe(s), initial encounter: Secondary | ICD-10-CM

## 2020-06-19 MED ORDER — SULFAMETHOXAZOLE-TRIMETHOPRIM 400-80 MG PO TABS: 1.0000 | ORAL_TABLET | Freq: Two times a day (BID) | ORAL | 0 refills | Status: DC

## 2020-06-19 NOTE — Progress Notes (Signed)
Subjective: Maria Morris is a 39 y.o. female patient seen today in office for POV # 9 (DOS 03/08/2020), S/P left fifth toe amputation. Patient states her left foot is doing good but noticed a blister on her right 5th toe that started after going back to work, patient denies headache, chest pain, shortness of breath, nausea, vomiting, fever, or chills. No other issues noted.   Patient Active Problem List   Diagnosis Date Noted  . Sepsis ([place]) 03/07/2020  . AKI (acute kidney injury) ([place]) 03/07/2020  . DM II (diabetes mellitus, type II), controlled ([place]) 03/06/2020  . Osteomyelitis ([place]) 03/05/2020  . Elevated C-reactive protein (CRP) 01/29/2020  . Elevated sed rate 01/29/2020  . Gastroesophageal reflux disease 01/29/2020  . Myalgia 01/29/2020  . Seasonal allergic rhinitis due to pollen 01/29/2020  . Photosensitivity dermatitis 12/26/2019  . Hypertension associated with diabetes ([place]) 11/27/2019  . Amenorrhea 11/14/2019  . Class 3 severe obesity due to excess calories with serious comorbidity and body mass index (BMI) of 50.0 to 59.9 in adult ([place]) 11/14/2019  . GAD (generalized anxiety disorder) 11/14/2019  . Moderate episode of recurrent major depressive disorder ([place]) 11/14/2019  . Palpitations 11/14/2019  . Polyarthralgia 11/14/2019  . Well woman exam with routine gynecological exam 07/26/2019  . Breast pain, left 07/26/2019    Current Outpatient Medications on File Prior to Visit  Medication Sig Dispense Refill  . acetaminophen (TYLENOL) 500 MG tablet Take 1,000-2,000 mg by mouth 3 (three) times daily as needed for headache (migraine).     Marland Kitchen aspirin-acetaminophen-caffeine (EXCEDRIN MIGRAINE) 250-250-65 MG tablet Take 2 tablets by mouth every 6 (six) hours as needed for headache or migraine.    . Blood Glucose Monitoring Suppl (TRUE METRIX METER) w/Device KIT 1 each by Does not apply route 2 (two) times a day. 1 kit 0  . buPROPion (WELLBUTRIN XL) 150 MG 24 hr tablet Take  150 mg by mouth daily.     . Continuous Blood Gluc Sensor (DEXCOM G6 SENSOR) MISC Inject 1 Device into the skin every 14 (fourteen) days.     . Continuous Blood Gluc Transmit (DEXCOM G6 TRANSMITTER) MISC See admin instructions.    . cyclobenzaprine (FLEXERIL) 10 MG tablet Take 10 mg by mouth 2 (two) times daily as needed for muscle spasms.    . Dulaglutide (TRULICITY) 1.5 TR/3.2YE SOPN Inject 1.5 mg into the skin once a week. (Patient taking differently: Inject 1.5 mg into the skin every Monday. ) 2 mL 2  . DULoxetine (CYMBALTA) 60 MG capsule Take 1 capsule (60 mg total) by mouth daily. 30 capsule 3  . ferrous gluconate (FERGON) 324 MG tablet Take 324 mg by mouth daily with breakfast.    . fexofenadine (ALLEGRA) 180 MG tablet Take 180 mg by mouth daily.    . fluticasone (FLONASE) 50 MCG/ACT nasal spray SMARTSIG:1 Spray(s) Both Nares Daily PRN    . gabapentin (NEURONTIN) 800 MG tablet Take 800 mg by mouth in the morning, at noon, and at bedtime.     Marland Kitchen glipiZIDE (GLUCOTROL) 10 MG tablet Take 1 tablet (10 mg total) by mouth 2 (two) times daily before a meal. 60 tablet 3  . glucose blood (TRUE METRIX BLOOD GLUCOSE TEST) test strip Use as instructed (Patient taking differently: 1 each by Other route as directed. ) 100 each 12  . hydrOXYzine (ATARAX/VISTARIL) 10 MG tablet Take by mouth.    Marland Kitchen ibuprofen (ADVIL) 800 MG tablet Take 1 tablet (800 mg total) by mouth every 8 (eight)  hours as needed. 30 tablet 0  . indomethacin (INDOCIN) 50 MG capsule Take 50 mg by mouth 2 (two) times daily with a meal.    . insulin glargine (LANTUS SOLOSTAR) 100 UNIT/ML Solostar Pen Inject 50 Units into the skin daily. (Patient taking differently: Inject 50 Units into the skin at bedtime. ) 15 mL 2  . Insulin Pen Needle (TRUEPLUS PEN NEEDLES) 32G X 4 MM MISC Use as instructed. (Patient taking differently: 1 each by Other route as directed. ) 100 each 6  . lisinopril (ZESTRIL) 20 MG tablet Take 20 mg by mouth daily.    .  meloxicam (MOBIC) 15 MG tablet Take 15 mg by mouth daily.    . metFORMIN (GLUCOPHAGE) 500 MG tablet Take 1 tablet (500 mg total) by mouth 2 (two) times daily with a meal. 60 tablet 2  . montelukast (SINGULAIR) 10 MG tablet Take 10 mg by mouth daily.    . pantoprazole (PROTONIX) 40 MG tablet Take 1 tablet (40 mg total) by mouth daily. (Patient taking differently: Take 40 mg by mouth daily before breakfast. ) 30 tablet 2  . sulfamethoxazole-trimethoprim (BACTRIM) 400-80 MG tablet Take 1 tablet by mouth 2 (two) times daily. 28 tablet 0  . topiramate (TOPAMAX) 25 MG tablet Take 25 mg by mouth at bedtime.     . TRUEplus Lancets 28G MISC 1 each by Does not apply route 2 (two) times a day. 100 each 1  . Vitamin D, Ergocalciferol, (DRISDOL) 1.25 MG (50000 UNIT) CAPS capsule Take 50,000 Units by mouth every Monday.     . [DISCONTINUED] sodium chloride (OCEAN) 0.65 % SOLN nasal spray Place 1 spray into both nostrils as needed for congestion. (Patient not taking: Reported on 03/13/2019) 1 Bottle 0   No current facility-administered medications on file prior to visit.    No Known Allergies  Objective: There were no vitals filed for this visit.  General: No acute distress, AAOx3  Left foot: Incision well healed, no signs of infection noted, Capillary fill time <3 seconds in all remaining digits, gross sensation present via light touch to left but protective is diminished like previous.  No pain or crepitation with range of motion left foot.  No pain with calf compression.   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

## 2020-06-22 LAB — WOUND CULTURE
MICRO NUMBER:: 11133174
RESULT:: NO GROWTH
SPECIMEN QUALITY:: ADEQUATE

## 2020-06-23 ENCOUNTER — Encounter: Payer: Self-pay | Admitting: Sports Medicine

## 2020-07-03 ENCOUNTER — Encounter: Payer: Self-pay | Admitting: Sports Medicine

## 2020-07-03 ENCOUNTER — Other Ambulatory Visit: Payer: Self-pay

## 2020-07-03 ENCOUNTER — Ambulatory Visit (INDEPENDENT_AMBULATORY_CARE_PROVIDER_SITE_OTHER): Payer: 59 | Admitting: Sports Medicine

## 2020-07-03 DIAGNOSIS — S90426A Blister (nonthermal), unspecified lesser toe(s), initial encounter: Secondary | ICD-10-CM

## 2020-07-03 DIAGNOSIS — L02611 Cutaneous abscess of right foot: Secondary | ICD-10-CM

## 2020-07-03 DIAGNOSIS — Z9889 Other specified postprocedural states: Secondary | ICD-10-CM

## 2020-07-03 DIAGNOSIS — S98132A Complete traumatic amputation of one left lesser toe, initial encounter: Secondary | ICD-10-CM

## 2020-07-03 DIAGNOSIS — E1142 Type 2 diabetes mellitus with diabetic polyneuropathy: Secondary | ICD-10-CM

## 2020-07-03 DIAGNOSIS — L03031 Cellulitis of right toe: Secondary | ICD-10-CM

## 2020-07-03 NOTE — Progress Notes (Signed)
Subjective: Maria Morris is a 39 y.o. female patient seen today in office for POV # 11 (DOS 03/08/2020), S/P left fifth toe amputation and for new blister check on right 5th toe. Patient states  patient denies headache, chest pain, shortness of breath, nausea, vomiting, fever, or chills. No other issues noted.   Patient Active Problem List   Diagnosis Date Noted  . Sepsis ([place]) 03/07/2020  . AKI (acute kidney injury) ([place]) 03/07/2020  . DM II (diabetes mellitus, type II), controlled ([place]) 03/06/2020  . Osteomyelitis ([place]) 03/05/2020  . Elevated C-reactive protein (CRP) 01/29/2020  . Elevated sed rate 01/29/2020  . Gastroesophageal reflux disease 01/29/2020  . Myalgia 01/29/2020  . Seasonal allergic rhinitis due to pollen 01/29/2020  . Photosensitivity dermatitis 12/26/2019  . Hypertension associated with diabetes ([place]) 11/27/2019  . Amenorrhea 11/14/2019  . Class 3 severe obesity due to excess calories with serious comorbidity and body mass index (BMI) of 50.0 to 59.9 in adult ([place]) 11/14/2019  . GAD (generalized anxiety disorder) 11/14/2019  . Moderate episode of recurrent major depressive disorder ([place]) 11/14/2019  . Palpitations 11/14/2019  . Polyarthralgia 11/14/2019  . Well woman exam with routine gynecological exam 07/26/2019  . Breast pain, left 07/26/2019    Current Outpatient Medications on File Prior to Visit  Medication Sig Dispense Refill  . acetaminophen (TYLENOL) 500 MG tablet Take 1,000-2,000 mg by mouth 3 (three) times daily as needed for headache (migraine).     Marland Kitchen aspirin-acetaminophen-caffeine (EXCEDRIN MIGRAINE) 250-250-65 MG tablet Take 2 tablets by mouth every 6 (six) hours as needed for headache or migraine.    . Blood Glucose Monitoring Suppl (TRUE METRIX METER) w/Device KIT 1 each by Does not apply route 2 (two) times a day. 1 kit 0  . buPROPion (WELLBUTRIN XL) 150 MG 24 hr tablet Take 150 mg by mouth daily.     . Continuous Blood Gluc Sensor (DEXCOM  G6 SENSOR) MISC Inject 1 Device into the skin every 14 (fourteen) days.     . Continuous Blood Gluc Transmit (DEXCOM G6 TRANSMITTER) MISC See admin instructions.    . cyclobenzaprine (FLEXERIL) 10 MG tablet Take 10 mg by mouth 2 (two) times daily as needed for muscle spasms.    . Dulaglutide (TRULICITY) 1.5 ZJ/6.7HA SOPN Inject 1.5 mg into the skin once a week. (Patient taking differently: Inject 1.5 mg into the skin every Monday. ) 2 mL 2  . DULoxetine (CYMBALTA) 60 MG capsule Take 1 capsule (60 mg total) by mouth daily. 30 capsule 3  . ferrous gluconate (FERGON) 324 MG tablet Take 324 mg by mouth daily with breakfast.    . fexofenadine (ALLEGRA) 180 MG tablet Take 180 mg by mouth daily.    . fluticasone (FLONASE) 50 MCG/ACT nasal spray SMARTSIG:1 Spray(s) Both Nares Daily PRN    . gabapentin (NEURONTIN) 800 MG tablet Take 800 mg by mouth in the morning, at noon, and at bedtime.     Marland Kitchen glipiZIDE (GLUCOTROL) 10 MG tablet Take 1 tablet (10 mg total) by mouth 2 (two) times daily before a meal. 60 tablet 3  . glucose blood (TRUE METRIX BLOOD GLUCOSE TEST) test strip Use as instructed (Patient taking differently: 1 each by Other route as directed. ) 100 each 12  . hydrOXYzine (ATARAX/VISTARIL) 10 MG tablet Take by mouth.    Marland Kitchen ibuprofen (ADVIL) 800 MG tablet Take 1 tablet (800 mg total) by mouth every 8 (eight) hours as needed. 30 tablet 0  . indomethacin (INDOCIN) 50 MG  capsule Take 50 mg by mouth 2 (two) times daily with a meal.    . insulin glargine (LANTUS SOLOSTAR) 100 UNIT/ML Solostar Pen Inject 50 Units into the skin daily. (Patient taking differently: Inject 50 Units into the skin at bedtime. ) 15 mL 2  . Insulin Pen Needle (TRUEPLUS PEN NEEDLES) 32G X 4 MM MISC Use as instructed. (Patient taking differently: 1 each by Other route as directed. ) 100 each 6  . lisinopril (ZESTRIL) 20 MG tablet Take 20 mg by mouth daily.    . meloxicam (MOBIC) 15 MG tablet Take 15 mg by mouth daily.    . metFORMIN  (GLUCOPHAGE) 500 MG tablet Take 1 tablet (500 mg total) by mouth 2 (two) times daily with a meal. 60 tablet 2  . montelukast (SINGULAIR) 10 MG tablet Take 10 mg by mouth daily.    . ondansetron (ZOFRAN-ODT) 8 MG disintegrating tablet Take by mouth.    . pantoprazole (PROTONIX) 40 MG tablet Take 1 tablet (40 mg total) by mouth daily. (Patient taking differently: Take 40 mg by mouth daily before breakfast. ) 30 tablet 2  . sulfamethoxazole-trimethoprim (BACTRIM) 400-80 MG tablet Take 1 tablet by mouth 2 (two) times daily. 28 tablet 0  . topiramate (TOPAMAX) 25 MG tablet Take 25 mg by mouth at bedtime.     . TRUEplus Lancets 28G MISC 1 each by Does not apply route 2 (two) times a day. 100 each 1  . Vitamin D, Ergocalciferol, (DRISDOL) 1.25 MG (50000 UNIT) CAPS capsule Take 50,000 Units by mouth every Monday.     . [DISCONTINUED] sodium chloride (OCEAN) 0.65 % SOLN nasal spray Place 1 spray into both nostrils as needed for congestion. (Patient not taking: Reported on 03/13/2019) 1 Bottle 0   No current facility-administered medications on file prior to visit.    No Known Allergies  Objective: There were no vitals filed for this visit.  General: No acute distress, AAOx3  Left foot: Incision well healed, no signs of infection noted, Capillary fill time <3 seconds in all remaining digits, gross sensation present via light touch to left but protective is diminished like previous.  No pain or crepitation with range of motion left foot.  No pain with calf compression.   Right foot: blister to 5th toe now dry with no drainage with a partial thickness wound at the IPJ that measures less than 0.5cm. No signs of infection.  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 Wound measures post debridement as above. Wound was debrided to the level of the dermis with viable wound base exposed to promote healing. Hemostasis was achieved with manuel pressure. Patient tolerated procedure well without any discomfort or anesthesia necessary for this wound debridement.  -Applied iodosorb and dry sterile dressing and instructed patient to continue with daily dressings at home consisting of the same; Order placed with [facility] - Advised patient  to go to the ER or return to office if the wound worsens or if constitutional symptoms are present. -Continue with bactrim until completed  -May return in 2 weeks for final blister check on right  Landis Martins, DPM

## 2020-07-24 ENCOUNTER — Encounter: Payer: Self-pay | Admitting: Sports Medicine

## 2020-07-24 ENCOUNTER — Ambulatory Visit (INDEPENDENT_AMBULATORY_CARE_PROVIDER_SITE_OTHER): Payer: 59 | Admitting: Sports Medicine

## 2020-07-24 ENCOUNTER — Ambulatory Visit: Payer: 59

## 2020-07-24 ENCOUNTER — Other Ambulatory Visit: Payer: Self-pay

## 2020-07-24 DIAGNOSIS — M79671 Pain in right foot: Secondary | ICD-10-CM

## 2020-07-24 DIAGNOSIS — S93401A Sprain of unspecified ligament of right ankle, initial encounter: Secondary | ICD-10-CM

## 2020-07-24 DIAGNOSIS — E1142 Type 2 diabetes mellitus with diabetic polyneuropathy: Secondary | ICD-10-CM

## 2020-07-24 DIAGNOSIS — S90426A Blister (nonthermal), unspecified lesser toe(s), initial encounter: Secondary | ICD-10-CM

## 2020-07-24 MED ORDER — IBUPROFEN 800 MG PO TABS: 800.0000 mg | ORAL_TABLET | Freq: Three times a day (TID) | ORAL | 0 refills | Status: DC | PRN

## 2020-07-24 NOTE — Progress Notes (Signed)
Subjective: Maria Morris is a 39 y.o. female patient seen today in office for POV # 12 (DOS 03/08/2020), S/P left fifth toe amputation and f/u blister check on right 5th toe. Patient states she sprained her Ankle 1 week ago and has some tightness on the leg. Denies fever, or chills. No other issues noted.   Patient Active Problem List   Diagnosis Date Noted  . Sepsis ([place]) 03/07/2020  . AKI (acute kidney injury) ([place]) 03/07/2020  . DM II (diabetes mellitus, type II), controlled ([place]) 03/06/2020  . Osteomyelitis ([place]) 03/05/2020  . Elevated C-reactive protein (CRP) 01/29/2020  . Elevated sed rate 01/29/2020  . Gastroesophageal reflux disease 01/29/2020  . Myalgia 01/29/2020  . Seasonal allergic rhinitis due to pollen 01/29/2020  . Photosensitivity dermatitis 12/26/2019  . Hypertension associated with diabetes ([place]) 11/27/2019  . Amenorrhea 11/14/2019  . Class 3 severe obesity due to excess calories with serious comorbidity and body mass index (BMI) of 50.0 to 59.9 in adult ([place]) 11/14/2019  . GAD (generalized anxiety disorder) 11/14/2019  . Moderate episode of recurrent major depressive disorder ([place]) 11/14/2019  . Palpitations 11/14/2019  . Polyarthralgia 11/14/2019  . Well woman exam with routine gynecological exam 07/26/2019  . Breast pain, left 07/26/2019    Current Outpatient Medications on File Prior to Visit  Medication Sig Dispense Refill  . acetaminophen (TYLENOL) 500 MG tablet Take 1,000-2,000 mg by mouth 3 (three) times daily as needed for headache (migraine).     Marland Kitchen aspirin-acetaminophen-caffeine (EXCEDRIN MIGRAINE) 250-250-65 MG tablet Take 2 tablets by mouth every 6 (six) hours as needed for headache or migraine.    . Blood Glucose Monitoring Suppl (TRUE METRIX METER) w/Device KIT 1 each by Does not apply route 2 (two) times a day. 1 kit 0  . buPROPion (WELLBUTRIN XL) 150 MG 24 hr tablet Take 150 mg by mouth daily.     . Continuous Blood Gluc Sensor (DEXCOM G6  SENSOR) MISC Inject 1 Device into the skin every 14 (fourteen) days.     . Continuous Blood Gluc Transmit (DEXCOM G6 TRANSMITTER) MISC See admin instructions.    . cyclobenzaprine (FLEXERIL) 10 MG tablet Take 10 mg by mouth 2 (two) times daily as needed for muscle spasms.    . diclofenac Sodium (VOLTAREN) 1 % GEL SMARTSIG:2 Gram(s) Topical 4 Times Daily PRN    . Dulaglutide (TRULICITY) 1.5 NA/3.5TD SOPN Inject 1.5 mg into the skin once a week. (Patient taking differently: Inject 1.5 mg into the skin every Monday. ) 2 mL 2  . DULoxetine (CYMBALTA) 60 MG capsule Take 1 capsule (60 mg total) by mouth daily. 30 capsule 3  . ferrous gluconate (FERGON) 324 MG tablet Take 324 mg by mouth daily with breakfast.    . fexofenadine (ALLEGRA) 180 MG tablet Take 180 mg by mouth daily.    . fluticasone (FLONASE) 50 MCG/ACT nasal spray SMARTSIG:1 Spray(s) Both Nares Daily PRN    . gabapentin (NEURONTIN) 300 MG capsule Take 300 mg by mouth 3 (three) times daily.    Marland Kitchen gabapentin (NEURONTIN) 800 MG tablet Take 800 mg by mouth in the morning, at noon, and at bedtime.     Marland Kitchen glipiZIDE (GLUCOTROL) 10 MG tablet Take 1 tablet (10 mg total) by mouth 2 (two) times daily before a meal. 60 tablet 3  . glucose blood (TRUE METRIX BLOOD GLUCOSE TEST) test strip Use as instructed (Patient taking differently: 1 each by Other route as directed. ) 100 each 12  .  hydrOXYzine (ATARAX/VISTARIL) 10 MG tablet Take by mouth.    . indomethacin (INDOCIN) 50 MG capsule Take 50 mg by mouth 2 (two) times daily with a meal.    . insulin glargine (LANTUS SOLOSTAR) 100 UNIT/ML Solostar Pen Inject 50 Units into the skin daily. (Patient taking differently: Inject 50 Units into the skin at bedtime. ) 15 mL 2  . Insulin Pen Needle (TRUEPLUS PEN NEEDLES) 32G X 4 MM MISC Use as instructed. (Patient taking differently: 1 each by Other route as directed. ) 100 each 6  . lisinopril (ZESTRIL) 20 MG tablet Take 20 mg by mouth daily.    . meloxicam (MOBIC) 15  MG tablet Take 15 mg by mouth daily.    . metFORMIN (GLUCOPHAGE) 500 MG tablet Take 1 tablet (500 mg total) by mouth 2 (two) times daily with a meal. 60 tablet 2  . montelukast (SINGULAIR) 10 MG tablet Take 10 mg by mouth daily.    . ondansetron (ZOFRAN-ODT) 8 MG disintegrating tablet Take by mouth.    . pantoprazole (PROTONIX) 40 MG tablet Take 1 tablet (40 mg total) by mouth daily. (Patient taking differently: Take 40 mg by mouth daily before breakfast. ) 30 tablet 2  . sulfamethoxazole-trimethoprim (BACTRIM) 400-80 MG tablet Take 1 tablet by mouth 2 (two) times daily. 28 tablet 0  . topiramate (TOPAMAX) 25 MG tablet Take 25 mg by mouth at bedtime.     . TRUEplus Lancets 28G MISC 1 each by Does not apply route 2 (two) times a day. 100 each 1  . Vitamin D, Ergocalciferol, (DRISDOL) 1.25 MG (50000 UNIT) CAPS capsule Take 50,000 Units by mouth every Monday.     . [DISCONTINUED] sodium chloride (OCEAN) 0.65 % SOLN nasal spray Place 1 spray into both nostrils as needed for congestion. (Patient not taking: Reported on 03/13/2019) 1 Bottle 0   No current facility-administered medications on file prior to visit.    No Known Allergies  Objective: There were no vitals filed for this visit.  General: No acute distress, AAOx3  Left foot: Incision well healed, no signs of infection noted, Capillary fill time <3 seconds in all remaining digits, gross sensation present via light touch to left but protective is diminished like previous.  No pain or crepitation with range of motion left foot.  No pain with calf compression.   Right foot: blister to 5th toe now dry with no drainage with a partial thickness wound at the IPJ that measures less than 0.3cm improving. There is pain to lateral ankle and along peroneal course.  No signs of infection.  Assessment and Plan:  Problem List Items Addressed This Visit    None    Visit Diagnoses    Pain in right foot    -  Primary   Blister of fifth toe       Sprain  of right ankle, unspecified ligament, initial encounter       Diabetic polyneuropathy associated with type 2 diabetes mellitus ([place])       Relevant Medications   gabapentin (NEURONTIN) 300 MG capsule      -Patient seen and evaluated - Excisionally dedbrided blistered wound to healthy bleeding borders removing nonviable tissue using a sterile chisel blade. Wound measures post debridement as above. Wound was debrided to the level of the dermis with viable wound base exposed to promote healing. Hemostasis was achieved with manuel pressure. Patient tolerated procedure well without any discomfort or anesthesia necessary for this wound debridement.  -Applied iodosorb and  dry sterile dressing and instructed patient to continue with daily dressings at home consisting of the same -Applied Ace wrap to right ankle and advised patient if her ankle pain is not better next visit we will x-ray -Work note: light duty  -May return in 2-3 weeks for ankle and blister check on right  Landis Martins, DPM

## 2020-08-01 ENCOUNTER — Other Ambulatory Visit: Payer: Self-pay | Admitting: Sports Medicine

## 2020-08-04 NOTE — Telephone Encounter (Signed)
Please advise 

## 2020-08-14 ENCOUNTER — Other Ambulatory Visit: Payer: Self-pay

## 2020-08-14 ENCOUNTER — Ambulatory Visit (INDEPENDENT_AMBULATORY_CARE_PROVIDER_SITE_OTHER): Payer: 59 | Admitting: Sports Medicine

## 2020-08-14 DIAGNOSIS — E1142 Type 2 diabetes mellitus with diabetic polyneuropathy: Secondary | ICD-10-CM

## 2020-08-14 DIAGNOSIS — M79671 Pain in right foot: Secondary | ICD-10-CM

## 2020-08-14 DIAGNOSIS — S93401A Sprain of unspecified ligament of right ankle, initial encounter: Secondary | ICD-10-CM

## 2020-08-14 DIAGNOSIS — S90426A Blister (nonthermal), unspecified lesser toe(s), initial encounter: Secondary | ICD-10-CM

## 2020-08-14 NOTE — Progress Notes (Signed)
Subjective: Maria Morris is a 39 y.o. female patient seen today in office for POV # 13 (DOS 03/08/2020), S/P left fifth toe amputation and f/u blister check on right 5th toe. Patient states that her toe and ankle is doing better no pain.  No other issues noted.   Patient Active Problem List   Diagnosis Date Noted  . Sepsis ([place]) 03/07/2020  . AKI (acute kidney injury) ([place]) 03/07/2020  . DM II (diabetes mellitus, type II), controlled ([place]) 03/06/2020  . Osteomyelitis ([place]) 03/05/2020  . Elevated C-reactive protein (CRP) 01/29/2020  . Elevated sed rate 01/29/2020  . Gastroesophageal reflux disease 01/29/2020  . Myalgia 01/29/2020  . Seasonal allergic rhinitis due to pollen 01/29/2020  . Photosensitivity dermatitis 12/26/2019  . Hypertension associated with diabetes ([place]) 11/27/2019  . Amenorrhea 11/14/2019  . Class 3 severe obesity due to excess calories with serious comorbidity and body mass index (BMI) of 50.0 to 59.9 in adult ([place]) 11/14/2019  . GAD (generalized anxiety disorder) 11/14/2019  . Moderate episode of recurrent major depressive disorder ([place]) 11/14/2019  . Palpitations 11/14/2019  . Polyarthralgia 11/14/2019  . Well woman exam with routine gynecological exam 07/26/2019  . Breast pain, left 07/26/2019    Current Outpatient Medications on File Prior to Visit  Medication Sig Dispense Refill  . acetaminophen (TYLENOL) 500 MG tablet Take 1,000-2,000 mg by mouth 3 (three) times daily as needed for headache (migraine).     Marland Kitchen aspirin-acetaminophen-caffeine (EXCEDRIN MIGRAINE) 250-250-65 MG tablet Take 2 tablets by mouth every 6 (six) hours as needed for headache or migraine.    . Blood Glucose Monitoring Suppl (TRUE METRIX METER) w/Device KIT 1 each by Does not apply route 2 (two) times a day. 1 kit 0  . buPROPion (WELLBUTRIN XL) 150 MG 24 hr tablet Take 150 mg by mouth daily.     . Continuous Blood Gluc Sensor (DEXCOM G6 SENSOR) MISC Inject 1 Device into the  skin every 14 (fourteen) days.     . Continuous Blood Gluc Transmit (DEXCOM G6 TRANSMITTER) MISC See admin instructions.    . cyclobenzaprine (FLEXERIL) 10 MG tablet Take 10 mg by mouth 2 (two) times daily as needed for muscle spasms.    . diclofenac Sodium (VOLTAREN) 1 % GEL SMARTSIG:2 Gram(s) Topical 4 Times Daily PRN    . Dulaglutide (TRULICITY) 1.5 VX/4.8AX SOPN Inject 1.5 mg into the skin once a week. (Patient taking differently: Inject 1.5 mg into the skin every Monday. ) 2 mL 2  . DULoxetine (CYMBALTA) 60 MG capsule Take 1 capsule (60 mg total) by mouth daily. 30 capsule 3  . ferrous gluconate (FERGON) 324 MG tablet Take 324 mg by mouth daily with breakfast.    . fexofenadine (ALLEGRA) 180 MG tablet Take 180 mg by mouth daily.    . fluticasone (FLONASE) 50 MCG/ACT nasal spray SMARTSIG:1 Spray(s) Both Nares Daily PRN    . gabapentin (NEURONTIN) 300 MG capsule Take 300 mg by mouth 3 (three) times daily.    Marland Kitchen gabapentin (NEURONTIN) 800 MG tablet Take 800 mg by mouth in the morning, at noon, and at bedtime.     Marland Kitchen glipiZIDE (GLUCOTROL) 10 MG tablet Take 1 tablet (10 mg total) by mouth 2 (two) times daily before a meal. 60 tablet 3  . glucose blood (TRUE METRIX BLOOD GLUCOSE TEST) test strip Use as instructed (Patient taking differently: 1 each by Other route as directed. ) 100 each 12  . hydrOXYzine (ATARAX/VISTARIL) 10 MG tablet  Take by mouth.    Marland Kitchen ibuprofen (ADVIL) 800 MG tablet TAKE 1 TABLET(800 MG) BY MOUTH EVERY 8 HOURS AS NEEDED 30 tablet 0  . indomethacin (INDOCIN) 50 MG capsule Take 50 mg by mouth 2 (two) times daily with a meal.    . insulin glargine (LANTUS SOLOSTAR) 100 UNIT/ML Solostar Pen Inject 50 Units into the skin daily. (Patient taking differently: Inject 50 Units into the skin at bedtime. ) 15 mL 2  . Insulin Pen Needle (TRUEPLUS PEN NEEDLES) 32G X 4 MM MISC Use as instructed. (Patient taking differently: 1 each by Other route as directed. ) 100 each 6  . lisinopril (ZESTRIL)  20 MG tablet Take 20 mg by mouth daily.    . meloxicam (MOBIC) 15 MG tablet Take 15 mg by mouth daily.    . metFORMIN (GLUCOPHAGE) 500 MG tablet Take 1 tablet (500 mg total) by mouth 2 (two) times daily with a meal. 60 tablet 2  . montelukast (SINGULAIR) 10 MG tablet Take 10 mg by mouth daily.    . ondansetron (ZOFRAN-ODT) 8 MG disintegrating tablet Take by mouth.    . pantoprazole (PROTONIX) 40 MG tablet Take 1 tablet (40 mg total) by mouth daily. (Patient taking differently: Take 40 mg by mouth daily before breakfast. ) 30 tablet 2  . sulfamethoxazole-trimethoprim (BACTRIM) 400-80 MG tablet Take 1 tablet by mouth 2 (two) times daily. 28 tablet 0  . topiramate (TOPAMAX) 25 MG tablet Take 25 mg by mouth at bedtime.     . TRUEplus Lancets 28G MISC 1 each by Does not apply route 2 (two) times a day. 100 each 1  . Vitamin D, Ergocalciferol, (DRISDOL) 1.25 MG (50000 UNIT) CAPS capsule Take 50,000 Units by mouth every Monday.     . [DISCONTINUED] sodium chloride (OCEAN) 0.65 % SOLN nasal spray Place 1 spray into both nostrils as needed for congestion. (Patient not taking: Reported on 03/13/2019) 1 Bottle 0   No current facility-administered medications on file prior to visit.    No Known Allergies  Objective: There were no vitals filed for this visit.  General: No acute distress, AAOx3  Left foot: Incision well healed, no signs of infection noted, Capillary fill time <3 seconds in all remaining digits, gross sensation present via light touch to left but protective is diminished like previous.  No pain or crepitation with range of motion left foot.  No pain with calf compression.   Right foot: blister to 5th toe healed. There is no pain to right foot or ankle.  No signs of infection.  Assessment and Plan:  Problem List Items Addressed This Visit   None   Visit Diagnoses    Blister of fifth toe    -  Primary   Pain in right foot       Sprain of right ankle, unspecified ligament, initial  encounter       Diabetic polyneuropathy associated with type 2 diabetes mellitus ([place])          -Patient seen and evaluated -Right 5th toe is healed; recommend bandaid when in shoe to prevent rubbing and re-blister -Continue with good supportive shoes -May use ankle sleeve PRN -Return 1 month for final toe check.   Landis Martins, DPM

## 2020-08-15 ENCOUNTER — Encounter: Payer: Self-pay | Admitting: Sports Medicine

## 2020-08-15 ENCOUNTER — Other Ambulatory Visit: Payer: Self-pay | Admitting: Sports Medicine

## 2020-08-25 ENCOUNTER — Other Ambulatory Visit: Payer: Self-pay | Admitting: Sports Medicine

## 2020-08-31 NOTE — Telephone Encounter (Signed)
Please advise 

## 2020-09-11 ENCOUNTER — Other Ambulatory Visit: Payer: Self-pay

## 2020-09-11 ENCOUNTER — Ambulatory Visit (INDEPENDENT_AMBULATORY_CARE_PROVIDER_SITE_OTHER): Payer: 59 | Admitting: Sports Medicine

## 2020-09-11 DIAGNOSIS — E1142 Type 2 diabetes mellitus with diabetic polyneuropathy: Secondary | ICD-10-CM

## 2020-09-11 DIAGNOSIS — L03031 Cellulitis of right toe: Secondary | ICD-10-CM | POA: Diagnosis not present

## 2020-09-11 DIAGNOSIS — L02611 Cutaneous abscess of right foot: Secondary | ICD-10-CM

## 2020-09-11 DIAGNOSIS — S90426A Blister (nonthermal), unspecified lesser toe(s), initial encounter: Secondary | ICD-10-CM

## 2020-09-11 DIAGNOSIS — M79671 Pain in right foot: Secondary | ICD-10-CM

## 2020-09-11 NOTE — Progress Notes (Signed)
Subjective: Maria Morris is a 40 y.o. female patient seen today in office for POV # 14 (DOS 03/08/2020), S/P left fifth toe amputation and f/u blister check on right 5th toe. Patient states that her toe on right is doing good. Denies any problems or complaints at this time.  Patient Active Problem List   Diagnosis Date Noted  . Sepsis ([place]) 03/07/2020  . AKI (acute kidney injury) ([place]) 03/07/2020  . DM II (diabetes mellitus, type II), controlled ([place]) 03/06/2020  . Osteomyelitis ([place]) 03/05/2020  . Elevated C-reactive protein (CRP) 01/29/2020  . Elevated sed rate 01/29/2020  . Gastroesophageal reflux disease 01/29/2020  . Myalgia 01/29/2020  . Seasonal allergic rhinitis due to pollen 01/29/2020  . Photosensitivity dermatitis 12/26/2019  . Hypertension associated with diabetes ([place]) 11/27/2019  . Amenorrhea 11/14/2019  . Class 3 severe obesity due to excess calories with serious comorbidity and body mass index (BMI) of 50.0 to 59.9 in adult ([place]) 11/14/2019  . GAD (generalized anxiety disorder) 11/14/2019  . Moderate episode of recurrent major depressive disorder ([place]) 11/14/2019  . Palpitations 11/14/2019  . Polyarthralgia 11/14/2019  . Well woman exam with routine gynecological exam 07/26/2019  . Breast pain, left 07/26/2019    Current Outpatient Medications on File Prior to Visit  Medication Sig Dispense Refill  . acetaminophen (TYLENOL) 500 MG tablet Take 1,000-2,000 mg by mouth 3 (three) times daily as needed for headache (migraine).     Marland Kitchen aspirin-acetaminophen-caffeine (EXCEDRIN MIGRAINE) 250-250-65 MG tablet Take 2 tablets by mouth every 6 (six) hours as needed for headache or migraine.    . Blood Glucose Monitoring Suppl (TRUE METRIX METER) w/Device KIT 1 each by Does not apply route 2 (two) times a day. 1 kit 0  . buPROPion (WELLBUTRIN XL) 150 MG 24 hr tablet Take 150 mg by mouth daily.     . Continuous Blood Gluc Receiver ([place]) [place]     . Continuous  Blood Gluc Sensor (DEXCOM G6 SENSOR) MISC Inject 1 Device into the skin every 14 (fourteen) days.     . Continuous Blood Gluc Transmit (DEXCOM G6 TRANSMITTER) MISC See admin instructions.    . cyclobenzaprine (FLEXERIL) 10 MG tablet Take 10 mg by mouth 2 (two) times daily as needed for muscle spasms.    . diclofenac Sodium (VOLTAREN) 1 % GEL SMARTSIG:2 Gram(s) Topical 4 Times Daily PRN    . Dulaglutide (TRULICITY) 1.5 ZO/1.0RU SOPN Inject 1.5 mg into the skin once a week. (Patient taking differently: Inject 1.5 mg into the skin every Monday.) 2 mL 2  . DULoxetine (CYMBALTA) 60 MG capsule Take 1 capsule (60 mg total) by mouth daily. 30 capsule 3  . ferrous gluconate (FERGON) 324 MG tablet Take 324 mg by mouth daily with breakfast.    . fexofenadine (ALLEGRA) 180 MG tablet Take 180 mg by mouth daily.    . fluticasone (FLONASE) 50 MCG/ACT nasal spray SMARTSIG:1 Spray(s) Both Nares Daily PRN    . gabapentin (NEURONTIN) 300 MG capsule Take 300 mg by mouth 3 (three) times daily.    Marland Kitchen gabapentin (NEURONTIN) 400 MG capsule Take 400 mg by mouth 3 (three) times daily.    Marland Kitchen gabapentin (NEURONTIN) 800 MG tablet Take 800 mg by mouth in the morning, at noon, and at bedtime.     Marland Kitchen glipiZIDE (GLUCOTROL) 10 MG tablet Take 1 tablet (10 mg total) by mouth 2 (two) times daily before a meal. 60 tablet 3  . glucose blood (TRUE METRIX BLOOD GLUCOSE TEST)  test strip Use as instructed (Patient taking differently: 1 each by Other route as directed.) 100 each 12  . hydrOXYzine (ATARAX/VISTARIL) 10 MG tablet Take by mouth.    Marland Kitchen ibuprofen (ADVIL) 800 MG tablet TAKE 1 TABLET(800 MG) BY MOUTH EVERY 8 HOURS AS NEEDED 30 tablet 0  . indomethacin (INDOCIN) 50 MG capsule Take 50 mg by mouth 2 (two) times daily with a meal.    . insulin glargine (LANTUS SOLOSTAR) 100 UNIT/ML Solostar Pen Inject 50 Units into the skin daily. (Patient taking differently: Inject 50 Units into the skin at bedtime.) 15 mL 2  . Insulin Pen Needle (TRUEPLUS  PEN NEEDLES) 32G X 4 MM MISC Use as instructed. (Patient taking differently: 1 each by Other route as directed.) 100 each 6  . lisinopril (ZESTRIL) 20 MG tablet Take 20 mg by mouth daily.    . meloxicam (MOBIC) 15 MG tablet Take 15 mg by mouth daily.    . metFORMIN (GLUCOPHAGE) 500 MG tablet Take 1 tablet (500 mg total) by mouth 2 (two) times daily with a meal. 60 tablet 2  . montelukast (SINGULAIR) 10 MG tablet Take 10 mg by mouth daily.    . ondansetron (ZOFRAN-ODT) 8 MG disintegrating tablet Take by mouth.    . pantoprazole (PROTONIX) 40 MG tablet Take 1 tablet (40 mg total) by mouth daily. (Patient taking differently: Take 40 mg by mouth daily before breakfast.) 30 tablet 2  . rizatriptan (MAXALT) 10 MG tablet Take by mouth.    . sulfamethoxazole-trimethoprim (BACTRIM) 400-80 MG tablet Take 1 tablet by mouth 2 (two) times daily. 28 tablet 0  . topiramate (TOPAMAX) 100 MG tablet     . topiramate (TOPAMAX) 25 MG tablet Take 25 mg by mouth at bedtime.     . triamcinolone (KENALOG) 0.1 % Apply topically 2 (two) times daily.    . TRUEplus Lancets 28G MISC 1 each by Does not apply route 2 (two) times a day. 100 each 1  . Vitamin D, Ergocalciferol, (DRISDOL) 1.25 MG (50000 UNIT) CAPS capsule Take 50,000 Units by mouth every Monday.     . [DISCONTINUED] sodium chloride (OCEAN) 0.65 % SOLN nasal spray Place 1 spray into both nostrils as needed for congestion. (Patient not taking: Reported on 03/13/2019) 1 Bottle 0   No current facility-administered medications on file prior to visit.    No Known Allergies  Objective: There were no vitals filed for this visit.  General: No acute distress, AAOx3  Left foot: No acute concerns  Right foot: blister to 5th toe healed now with mild reactive keratosis. There is no pain to right foot or ankle.  No signs of infection. Neurovascular status intact. Hammertoe deformity.  Assessment and Plan:  Problem List Items Addressed This Visit   None   Visit  Diagnoses    Blister of fifth toe    -  Primary   Pain in right foot       Diabetic polyneuropathy associated with type 2 diabetes mellitus ([facility])       Relevant Medications   gabapentin (NEURONTIN) 400 MG capsule   topiramate (TOPAMAX) 100 MG tablet   Cellulitis and abscess of toe of right foot          -Patient seen and evaluated -Right 5th toe is healed; No reopening -Advised good supportive shoes for foot type that does not rub -Return in 3 months for diabetic foot check or sooner if problems arise.  Landis Martins, DPM

## 2020-09-12 ENCOUNTER — Other Ambulatory Visit: Payer: Self-pay | Admitting: Sports Medicine

## 2020-12-18 ENCOUNTER — Other Ambulatory Visit: Payer: Self-pay

## 2020-12-18 ENCOUNTER — Encounter: Payer: Self-pay | Admitting: Sports Medicine

## 2020-12-18 ENCOUNTER — Ambulatory Visit (INDEPENDENT_AMBULATORY_CARE_PROVIDER_SITE_OTHER): Payer: 59 | Admitting: Sports Medicine

## 2020-12-18 DIAGNOSIS — E1142 Type 2 diabetes mellitus with diabetic polyneuropathy: Secondary | ICD-10-CM

## 2020-12-18 DIAGNOSIS — B351 Tinea unguium: Secondary | ICD-10-CM

## 2020-12-18 DIAGNOSIS — L03031 Cellulitis of right toe: Secondary | ICD-10-CM | POA: Diagnosis not present

## 2020-12-18 DIAGNOSIS — Z9889 Other specified postprocedural states: Secondary | ICD-10-CM | POA: Diagnosis not present

## 2020-12-18 DIAGNOSIS — M79609 Pain in unspecified limb: Secondary | ICD-10-CM

## 2020-12-18 MED ORDER — AMOXICILLIN-POT CLAVULANATE 875-125 MG PO TABS: 1.0000 | ORAL_TABLET | Freq: Two times a day (BID) | ORAL | 0 refills | Status: DC

## 2020-12-18 MED ORDER — NEOMYCIN-POLYMYXIN-HC 3.5-10000-1 OT SOLN: OTIC | 0 refills | Status: DC

## 2020-12-18 NOTE — Progress Notes (Addendum)
Subjective: Maria Morris is a 40 y.o. female patient with history of diabetes who presents to office today for evaluation of toe pain on right. Reports that she also gets some pain to the top of the foot. Reports that she thinks that pain is from her shoes rubbing. No other issues other noted.  A1c 7.3.  Patient Active Problem List   Diagnosis Date Noted  . Sepsis ([place]) 03/07/2020  . AKI (acute kidney injury) ([place]) 03/07/2020  . DM II (diabetes mellitus, type II), controlled ([place]) 03/06/2020  . Osteomyelitis ([place]) 03/05/2020  . Elevated C-reactive protein (CRP) 01/29/2020  . Elevated sed rate 01/29/2020  . Gastroesophageal reflux disease 01/29/2020  . Myalgia 01/29/2020  . Seasonal allergic rhinitis due to pollen 01/29/2020  . Photosensitivity dermatitis 12/26/2019  . Hypertension associated with diabetes ([place]) 11/27/2019  . Amenorrhea 11/14/2019  . Class 3 severe obesity due to excess calories with serious comorbidity and body mass index (BMI) of 50.0 to 59.9 in adult ([place]) 11/14/2019  . GAD (generalized anxiety disorder) 11/14/2019  . Moderate episode of recurrent major depressive disorder ([place]) 11/14/2019  . Palpitations 11/14/2019  . Polyarthralgia 11/14/2019  . Well woman exam with routine gynecological exam 07/26/2019  . Breast pain, left 07/26/2019   Current Outpatient Medications on File Prior to Visit  Medication Sig Dispense Refill  . acetaminophen (TYLENOL) 500 MG tablet Take 1,000-2,000 mg by mouth 3 (three) times daily as needed for headache (migraine).     Marland Kitchen aspirin-acetaminophen-caffeine (EXCEDRIN MIGRAINE) 250-250-65 MG tablet Take 2 tablets by mouth every 6 (six) hours as needed for headache or migraine.    . Blood Glucose Monitoring Suppl (TRUE METRIX METER) w/Device KIT 1 each by Does not apply route 2 (two) times a day. 1 kit 0  . buPROPion (WELLBUTRIN XL) 150 MG 24 hr tablet Take 150 mg by mouth daily.     . Continuous Blood Gluc Receiver ([place]) [place]     . Continuous Blood Gluc Sensor (DEXCOM G6 SENSOR) MISC Inject 1 Device into the skin every 14 (fourteen) days.     . Continuous Blood Gluc Transmit (DEXCOM G6 TRANSMITTER) MISC See admin instructions.    . cyclobenzaprine (FLEXERIL) 10 MG tablet Take 10 mg by mouth 2 (two) times daily as needed for muscle spasms.    . diclofenac Sodium (VOLTAREN) 1 % GEL SMARTSIG:2 Gram(s) Topical 4 Times Daily PRN    . Dulaglutide (TRULICITY) 1.5 DU/2.0UR SOPN Inject 1.5 mg into the skin once a week. (Patient taking differently: Inject 1.5 mg into the skin every Monday.) 2 mL 2  . DULoxetine (CYMBALTA) 60 MG capsule Take 1 capsule (60 mg total) by mouth daily. 30 capsule 3  . ferrous gluconate (FERGON) 324 MG tablet Take 324 mg by mouth daily with breakfast.    . fexofenadine (ALLEGRA) 180 MG tablet Take 180 mg by mouth daily.    . fluticasone (FLONASE) 50 MCG/ACT nasal spray SMARTSIG:1 Spray(s) Both Nares Daily PRN    . gabapentin (NEURONTIN) 300 MG capsule Take 300 mg by mouth 3 (three) times daily.    Marland Kitchen gabapentin (NEURONTIN) 400 MG capsule Take 400 mg by mouth 3 (three) times daily.    Marland Kitchen gabapentin (NEURONTIN) 600 MG tablet Take 600 mg by mouth 3 (three) times daily.    Marland Kitchen gabapentin (NEURONTIN) 800 MG tablet Take 800 mg by mouth in the morning, at noon, and at bedtime.     Marland Kitchen glipiZIDE (GLUCOTROL) 10 MG tablet Take 1 tablet (  10 mg total) by mouth 2 (two) times daily before a meal. 60 tablet 3  . glucose blood (TRUE METRIX BLOOD GLUCOSE TEST) test strip Use as instructed (Patient taking differently: 1 each by Other route as directed.) 100 each 12  . hydrOXYzine (ATARAX/VISTARIL) 10 MG tablet Take by mouth.    Marland Kitchen ibuprofen (ADVIL) 800 MG tablet TAKE 1 TABLET(800 MG) BY MOUTH EVERY 8 HOURS AS NEEDED 30 tablet 0  . indomethacin (INDOCIN) 50 MG capsule Take 50 mg by mouth 2 (two) times daily with a meal.    . insulin glargine (LANTUS SOLOSTAR) 100 UNIT/ML Solostar Pen Inject 50 Units into the skin  daily. (Patient taking differently: Inject 50 Units into the skin at bedtime.) 15 mL 2  . Insulin Pen Needle (TRUEPLUS PEN NEEDLES) 32G X 4 MM MISC Use as instructed. (Patient taking differently: 1 each by Other route as directed.) 100 each 6  . lisinopril (ZESTRIL) 20 MG tablet Take 20 mg by mouth daily.    . meloxicam (MOBIC) 15 MG tablet Take 15 mg by mouth daily.    . metFORMIN (GLUCOPHAGE) 500 MG tablet Take 1 tablet (500 mg total) by mouth 2 (two) times daily with a meal. 60 tablet 2  . montelukast (SINGULAIR) 10 MG tablet Take 10 mg by mouth daily.    . ondansetron (ZOFRAN-ODT) 8 MG disintegrating tablet Take by mouth.    . pantoprazole (PROTONIX) 40 MG tablet Take 1 tablet (40 mg total) by mouth daily. (Patient taking differently: Take 40 mg by mouth daily before breakfast.) 30 tablet 2  . rizatriptan (MAXALT) 10 MG tablet Take by mouth.    . sulfamethoxazole-trimethoprim (BACTRIM) 400-80 MG tablet Take 1 tablet by mouth 2 (two) times daily. 28 tablet 0  . topiramate (TOPAMAX) 100 MG tablet     . topiramate (TOPAMAX) 25 MG tablet Take 25 mg by mouth at bedtime.     . triamcinolone (KENALOG) 0.1 % Apply topically 2 (two) times daily.    . TRUEplus Lancets 28G MISC 1 each by Does not apply route 2 (two) times a day. 100 each 1  . Vitamin D, Ergocalciferol, (DRISDOL) 1.25 MG (50000 UNIT) CAPS capsule Take 50,000 Units by mouth every Monday.     . [DISCONTINUED] sodium chloride (OCEAN) 0.65 % SOLN nasal spray Place 1 spray into both nostrils as needed for congestion. (Patient not taking: Reported on 03/13/2019) 1 Bottle 0   No current facility-administered medications on file prior to visit.   No Known Allergies  No results found for this or any previous visit (from the past 2160 hour(s)).  Objective: General: Patient is awake, alert, and oriented x 3 and in no acute distress.  Integument: Skin is warm, dry and supple bilateral. Blanchable erythema at right hallux proximal nail fold with  blanchable erythema. No open lesions or preulcerative lesions present bilateral. Remaining integument unremarkable.  Vasculature:  Dorsalis Pedis pulse 1/4 bilateral. Posterior Tibial pulse  1/4 bilateral.  Capillary fill time <5 toes 1-5 on right and 1-4 on left.   Neurology: The patient has decreased protective sensation bilateral.   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   Assessment and Plan: Problem List Items Addressed This Visit   None   Visit Diagnoses    Paronychia of great toe of right foot    -  Primary   Pain due to onychomycosis of nail       Diabetic polyneuropathy associated with type 2 diabetes mellitus ([place])  Relevant Medications   gabapentin (NEURONTIN) 600 MG tablet   Status post left foot surgery          -Examined patient. -Rx Augmentin -Rx Corticsporin drops to use at right great toe -Advised patient to avoid shoes that rub at 1st toe on right  -Stressed the importance of good glycemic control and the detriment of not  controlling glucose levels in relation to the foot. -Mechanically smoothed all remaining nails with a bur without incident  -Patient to return in 3 weeks for follow up evaluation on toe/nail check will xray next visit if no better. -Patient advised to call the office if any problems or questions arise in the meantime.  Landis Martins, DPM

## 2021-01-08 ENCOUNTER — Encounter: Payer: Self-pay | Admitting: Sports Medicine

## 2021-01-08 ENCOUNTER — Ambulatory Visit (INDEPENDENT_AMBULATORY_CARE_PROVIDER_SITE_OTHER): Payer: 59 | Admitting: Sports Medicine

## 2021-01-08 ENCOUNTER — Other Ambulatory Visit: Payer: Self-pay

## 2021-01-08 DIAGNOSIS — E1142 Type 2 diabetes mellitus with diabetic polyneuropathy: Secondary | ICD-10-CM | POA: Diagnosis not present

## 2021-01-08 DIAGNOSIS — L03031 Cellulitis of right toe: Secondary | ICD-10-CM

## 2021-01-08 NOTE — Progress Notes (Signed)
Subjective: Maria Morris is a 40 y.o. female patient with history of diabetes who presents to office today for toe check on right. Reports that her toe is doing better, Drops and Augmentin helped. Denies constitutional symptoms.   A1c 7.3.  Patient Active Problem List   Diagnosis Date Noted  . Sepsis ([place]) 03/07/2020  . AKI (acute kidney injury) ([place]) 03/07/2020  . DM II (diabetes mellitus, type II), controlled ([place]) 03/06/2020  . Osteomyelitis ([place]) 03/05/2020  . Elevated C-reactive protein (CRP) 01/29/2020  . Elevated sed rate 01/29/2020  . Gastroesophageal reflux disease 01/29/2020  . Myalgia 01/29/2020  . Seasonal allergic rhinitis due to pollen 01/29/2020  . Photosensitivity dermatitis 12/26/2019  . Hypertension associated with diabetes ([place]) 11/27/2019  . Amenorrhea 11/14/2019  . Class 3 severe obesity due to excess calories with serious comorbidity and body mass index (BMI) of 50.0 to 59.9 in adult ([place]) 11/14/2019  . GAD (generalized anxiety disorder) 11/14/2019  . Moderate episode of recurrent major depressive disorder ([place]) 11/14/2019  . Palpitations 11/14/2019  . Polyarthralgia 11/14/2019  . Well woman exam with routine gynecological exam 07/26/2019  . Breast pain, left 07/26/2019   Current Outpatient Medications on File Prior to Visit  Medication Sig Dispense Refill  . acetaminophen (TYLENOL) 500 MG tablet Take 1,000-2,000 mg by mouth 3 (three) times daily as needed for headache (migraine).     Marland Kitchen amoxicillin-clavulanate (AUGMENTIN) 875-125 MG tablet Take 1 tablet by mouth 2 (two) times daily. 28 tablet 0  . aspirin-acetaminophen-caffeine (EXCEDRIN MIGRAINE) 250-250-65 MG tablet Take 2 tablets by mouth every 6 (six) hours as needed for headache or migraine.    . Blood Glucose Monitoring Suppl (TRUE METRIX METER) w/Device KIT 1 each by Does not apply route 2 (two) times a day. 1 kit 0  . buPROPion (WELLBUTRIN XL) 150 MG 24 hr tablet Take 150 mg by mouth daily.      . Continuous Blood Gluc Receiver ([place]) [place]     . Continuous Blood Gluc Sensor (DEXCOM G6 SENSOR) MISC Inject 1 Device into the skin every 14 (fourteen) days.     . Continuous Blood Gluc Transmit (DEXCOM G6 TRANSMITTER) MISC See admin instructions.    . cyclobenzaprine (FLEXERIL) 10 MG tablet Take 10 mg by mouth 2 (two) times daily as needed for muscle spasms.    . diclofenac Sodium (VOLTAREN) 1 % GEL SMARTSIG:2 Gram(s) Topical 4 Times Daily PRN    . Dulaglutide (TRULICITY) 1.5 ZG/0.1VC SOPN Inject 1.5 mg into the skin once a week. (Patient taking differently: Inject 1.5 mg into the skin every Monday.) 2 mL 2  . DULoxetine (CYMBALTA) 60 MG capsule Take 1 capsule (60 mg total) by mouth daily. 30 capsule 3  . ferrous gluconate (FERGON) 324 MG tablet Take 324 mg by mouth daily with breakfast.    . fexofenadine (ALLEGRA) 180 MG tablet Take 180 mg by mouth daily.    . fluticasone (FLONASE) 50 MCG/ACT nasal spray SMARTSIG:1 Spray(s) Both Nares Daily PRN    . gabapentin (NEURONTIN) 300 MG capsule Take 300 mg by mouth 3 (three) times daily.    Marland Kitchen gabapentin (NEURONTIN) 400 MG capsule Take 400 mg by mouth 3 (three) times daily.    Marland Kitchen gabapentin (NEURONTIN) 600 MG tablet Take 600 mg by mouth 3 (three) times daily.    Marland Kitchen gabapentin (NEURONTIN) 800 MG tablet Take 800 mg by mouth in the morning, at noon, and at bedtime.     Marland Kitchen glipiZIDE (GLUCOTROL) 10 MG tablet  Take 1 tablet (10 mg total) by mouth 2 (two) times daily before a meal. 60 tablet 3  . glucose blood (TRUE METRIX BLOOD GLUCOSE TEST) test strip Use as instructed (Patient taking differently: 1 each by Other route as directed.) 100 each 12  . hydrOXYzine (ATARAX/VISTARIL) 10 MG tablet Take by mouth.    Marland Kitchen ibuprofen (ADVIL) 800 MG tablet TAKE 1 TABLET(800 MG) BY MOUTH EVERY 8 HOURS AS NEEDED 30 tablet 0  . indomethacin (INDOCIN) 50 MG capsule Take 50 mg by mouth 2 (two) times daily with a meal.    . insulin glargine (LANTUS SOLOSTAR) 100  UNIT/ML Solostar Pen Inject 50 Units into the skin daily. (Patient taking differently: Inject 50 Units into the skin at bedtime.) 15 mL 2  . Insulin Pen Needle (TRUEPLUS PEN NEEDLES) 32G X 4 MM MISC Use as instructed. (Patient taking differently: 1 each by Other route as directed.) 100 each 6  . lisinopril (ZESTRIL) 20 MG tablet Take 20 mg by mouth daily.    . meloxicam (MOBIC) 15 MG tablet Take 15 mg by mouth daily.    . metFORMIN (GLUCOPHAGE) 500 MG tablet Take 1 tablet (500 mg total) by mouth 2 (two) times daily with a meal. 60 tablet 2  . montelukast (SINGULAIR) 10 MG tablet Take 10 mg by mouth daily.    Marland Kitchen neomycin-polymyxin-hydrocortisone (CORTISPORIN) OTIC solution Apply 2-3 drops to right big toe once a day after shower 10 mL 0  . ondansetron (ZOFRAN-ODT) 8 MG disintegrating tablet Take by mouth.    . pantoprazole (PROTONIX) 40 MG tablet Take 1 tablet (40 mg total) by mouth daily. (Patient taking differently: Take 40 mg by mouth daily before breakfast.) 30 tablet 2  . rizatriptan (MAXALT) 10 MG tablet Take by mouth.    . sulfamethoxazole-trimethoprim (BACTRIM) 400-80 MG tablet Take 1 tablet by mouth 2 (two) times daily. 28 tablet 0  . topiramate (TOPAMAX) 100 MG tablet     . topiramate (TOPAMAX) 25 MG tablet Take 25 mg by mouth at bedtime.     . triamcinolone (KENALOG) 0.1 % Apply topically 2 (two) times daily.    . TRUEplus Lancets 28G MISC 1 each by Does not apply route 2 (two) times a day. 100 each 1  . Vitamin D, Ergocalciferol, (DRISDOL) 1.25 MG (50000 UNIT) CAPS capsule Take 50,000 Units by mouth every Monday.     . [DISCONTINUED] sodium chloride (OCEAN) 0.65 % SOLN nasal spray Place 1 spray into both nostrils as needed for congestion. (Patient not taking: Reported on 03/13/2019) 1 Bottle 0   No current facility-administered medications on file prior to visit.   No Known Allergies  No results found for this or any previous visit (from the past 2160 hour(s)).  Objective: General:  Patient is awake, alert, and oriented x 3 and in no acute distress.  Integument: Skin is warm, dry and supple bilateral. Resolved erythema at right hallux proximal nail fold. Nails are short and thick. No open lesions or preulcerative lesions present bilateral. Remaining integument unremarkable.  Vasculature:  Dorsalis Pedis pulse 1/4 bilateral. Posterior Tibial pulse  1/4 bilateral.  Capillary fill time <5 toes 1-5 on right and 1-4 on left.   Neurology: The patient has decreased protective sensation bilateral.   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 No pain to right great toe.  Assessment and Plan: Problem List Items Addressed This Visit   None   Visit Diagnoses    Paronychia of great toe of right foot    -  Primary   Diabetic polyneuropathy associated with type 2 diabetes mellitus ([place])          -Examined patient. -Right great toe paronychia is resolved; advised patient to monitor and to d/c corticosporin -Advised patient to avoid shoes that rub at the toes like before -Advised patient to avoid self filing or self pedicure -Return in 9 weeks for diabetic nail care -Patient advised to call the office if any problems or questions arise in the meantime.  Landis Martins, DPM

## 2021-03-12 ENCOUNTER — Ambulatory Visit: Payer: 59 | Admitting: Sports Medicine

## 2021-06-18 ENCOUNTER — Ambulatory Visit (INDEPENDENT_AMBULATORY_CARE_PROVIDER_SITE_OTHER): Payer: 59 | Admitting: Sports Medicine

## 2021-06-18 ENCOUNTER — Encounter: Payer: Self-pay | Admitting: Sports Medicine

## 2021-06-18 ENCOUNTER — Ambulatory Visit (INDEPENDENT_AMBULATORY_CARE_PROVIDER_SITE_OTHER): Payer: 59

## 2021-06-18 ENCOUNTER — Other Ambulatory Visit: Payer: Self-pay

## 2021-06-18 DIAGNOSIS — L97521 Non-pressure chronic ulcer of other part of left foot limited to breakdown of skin: Secondary | ICD-10-CM | POA: Diagnosis not present

## 2021-06-18 DIAGNOSIS — E11621 Type 2 diabetes mellitus with foot ulcer: Secondary | ICD-10-CM

## 2021-06-18 DIAGNOSIS — L97529 Non-pressure chronic ulcer of other part of left foot with unspecified severity: Secondary | ICD-10-CM

## 2021-06-18 DIAGNOSIS — S98132A Complete traumatic amputation of one left lesser toe, initial encounter: Secondary | ICD-10-CM

## 2021-06-18 DIAGNOSIS — E1142 Type 2 diabetes mellitus with diabetic polyneuropathy: Secondary | ICD-10-CM

## 2021-06-18 DIAGNOSIS — M79672 Pain in left foot: Secondary | ICD-10-CM

## 2021-06-18 MED ORDER — SULFAMETHOXAZOLE-TRIMETHOPRIM 400-80 MG PO TABS: 1.0000 | ORAL_TABLET | Freq: Two times a day (BID) | ORAL | 0 refills | Status: DC

## 2021-06-18 NOTE — Progress Notes (Signed)
Subjective: Maria Morris is a 40 y.o. female patient seen in office for evaluation of ulceration of the left fourth toe.  Patient reports that she noticed the toe wound developing about 2 weeks ago and states that slowly the foot has become more swollen and that she feels nauseous but no actually vomiting and states that she had an episode of chills.  Patient is assisted by husband who reports that likely this happened because she was wearing clogs shoes that I told her not to wear.  Fasting blood sugar not recorded patient reports that she has not been checking at home and does not know what her sugars are.  Patient Active Problem List   Diagnosis Date Noted   Sepsis ([place]) 03/07/2020   AKI (acute kidney injury) ([place]) 03/07/2020   DM II (diabetes mellitus, type II), controlled ([place]) 03/06/2020   Osteomyelitis ([place]) 03/05/2020   Elevated C-reactive protein (CRP) 01/29/2020   Elevated sed rate 01/29/2020   Gastroesophageal reflux disease 01/29/2020   Myalgia 01/29/2020   Seasonal allergic rhinitis due to pollen 01/29/2020   Photosensitivity dermatitis 12/26/2019   Hypertension associated with diabetes ([place]) 11/27/2019   Amenorrhea 11/14/2019   Class 3 severe obesity due to excess calories with serious comorbidity and body mass index (BMI) of 50.0 to 59.9 in adult [facility]) 11/14/2019   GAD (generalized anxiety disorder) 11/14/2019   Moderate episode of recurrent major depressive disorder ([place]) 11/14/2019   Palpitations 11/14/2019   Polyarthralgia 11/14/2019   Well woman exam with routine gynecological exam 07/26/2019   Breast pain, left 07/26/2019   Current Outpatient Medications on File Prior to Visit  Medication Sig Dispense Refill   acetaminophen (TYLENOL) 500 MG tablet Take 1,000-2,000 mg by mouth 3 (three) times daily as needed for headache (migraine).      amoxicillin-clavulanate (AUGMENTIN) 875-125 MG tablet Take 1 tablet by mouth 2 (two) times daily. 28 tablet 0    aspirin-acetaminophen-caffeine (EXCEDRIN MIGRAINE) 250-250-65 MG tablet Take 2 tablets by mouth every 6 (six) hours as needed for headache or migraine.     Blood Glucose Monitoring Suppl (TRUE METRIX METER) w/Device KIT 1 each by Does not apply route 2 (two) times a day. 1 kit 0   buPROPion (WELLBUTRIN XL) 150 MG 24 hr tablet Take 150 mg by mouth daily.      Continuous Blood Gluc Receiver (DEXCOM G6 RECEIVER) [place]      Continuous Blood Gluc Sensor (DEXCOM G6 SENSOR) MISC Inject 1 Device into the skin every 14 (fourteen) days.      Continuous Blood Gluc Transmit (DEXCOM G6 TRANSMITTER) MISC See admin instructions.     cyclobenzaprine (FLEXERIL) 10 MG tablet Take 10 mg by mouth 2 (two) times daily as needed for muscle spasms.     diclofenac Sodium (VOLTAREN) 1 % GEL SMARTSIG:2 Gram(s) Topical 4 Times Daily PRN     DULoxetine (CYMBALTA) 30 MG capsule Take 30 mg by mouth daily.     ferrous gluconate (FERGON) 324 MG tablet Take 324 mg by mouth daily with breakfast.     fexofenadine (ALLEGRA) 180 MG tablet Take 180 mg by mouth daily.     fluticasone (FLONASE) 50 MCG/ACT nasal spray SMARTSIG:1 Spray(s) Both Nares Daily PRN     gabapentin (NEURONTIN) 300 MG capsule Take 300 mg by mouth 3 (three) times daily.     gabapentin (NEURONTIN) 400 MG capsule Take 400 mg by mouth 3 (three) times daily.     gabapentin (NEURONTIN) 600 MG tablet Take 600 mg by  mouth 3 (three) times daily.     gabapentin (NEURONTIN) 800 MG tablet Take 800 mg by mouth in the morning, at noon, and at bedtime.      gabapentin (NEURONTIN) 800 MG tablet Take by mouth.     glipiZIDE (GLUCOTROL) 10 MG tablet Take 1 tablet (10 mg total) by mouth 2 (two) times daily before a meal. 60 tablet 3   glucose blood (TRUE METRIX BLOOD GLUCOSE TEST) test strip Use as instructed (Patient taking differently: 1 each by Other route as directed.) 100 each 12   hydrOXYzine (ATARAX/VISTARIL) 10 MG tablet Take by mouth.     ibuprofen (ADVIL) 800 MG tablet TAKE  1 TABLET(800 MG) BY MOUTH EVERY 8 HOURS AS NEEDED 30 tablet 0   indomethacin (INDOCIN) 50 MG capsule Take 50 mg by mouth 2 (two) times daily with a meal.     insulin glargine (LANTUS SOLOSTAR) 100 UNIT/ML Solostar Pen Inject 50 Units into the skin daily. (Patient taking differently: Inject 50 Units into the skin at bedtime.) 15 mL 2   Insulin Pen Needle (TRUEPLUS PEN NEEDLES) 32G X 4 MM MISC Use as instructed. (Patient taking differently: 1 each by Other route as directed.) 100 each 6   lisinopril (ZESTRIL) 20 MG tablet Take 20 mg by mouth daily.     meloxicam (MOBIC) 15 MG tablet Take 15 mg by mouth daily.     metFORMIN (GLUCOPHAGE) 500 MG tablet Take 1 tablet (500 mg total) by mouth 2 (two) times daily with a meal. 60 tablet 2   montelukast (SINGULAIR) 10 MG tablet Take 10 mg by mouth daily.     neomycin-polymyxin-hydrocortisone (CORTISPORIN) OTIC solution Apply 2-3 drops to right big toe once a day after shower 10 mL 0   ondansetron (ZOFRAN-ODT) 8 MG disintegrating tablet Take by mouth.     pantoprazole (PROTONIX) 40 MG tablet Take 1 tablet (40 mg total) by mouth daily. (Patient taking differently: Take 40 mg by mouth daily before breakfast.) 30 tablet 2   rizatriptan (MAXALT) 10 MG tablet Take by mouth.     topiramate (TOPAMAX) 100 MG tablet      topiramate (TOPAMAX) 25 MG tablet Take 25 mg by mouth at bedtime.      triamcinolone (KENALOG) 0.1 % Apply topically 2 (two) times daily.     TRUEplus Lancets 28G MISC 1 each by Does not apply route 2 (two) times a day. 371 each 1   TRULICITY 3 GG/2.6RS SOPN SMARTSIG:3 Milligram(s) SUB-Q Once a Week     Vitamin D, Ergocalciferol, (DRISDOL) 1.25 MG (50000 UNIT) CAPS capsule Take 50,000 Units by mouth every Monday.      [DISCONTINUED] sodium chloride (OCEAN) 0.65 % SOLN nasal spray Place 1 spray into both nostrils as needed for congestion. (Patient not taking: Reported on 03/13/2019) 1 Bottle 0   No current facility-administered medications on file  prior to visit.   No Known Allergies  No results found for this or any previous visit (from the past 2160 hour(s)).  Objective: There were no vitals filed for this visit.  General: Patient is awake, alert, oriented x 3 and in no acute distress.  Dermatology: Skin is warm and dry bilateral with a partial thickness ulceration present left 4th toe. Ulceration measures 0.3 cm x 0.2 cm x 0.1 cm. There is a keratotic border with a granular base. The ulceration does not probe to bone. There is no malodor, no active drainage, no erythema, but mild periwound hyperpigmentation, focal edema to the left  foot without warmth. No other acute signs of infection.  Old surgical scar or previous left fifth toe amputation stump site well-healed.   Vascular: Dorsalis Pedis pulse = 1/4 Bilateral,  Posterior Tibial pulse = 1/4 Bilateral,  Capillary Fill Time < 5 seconds  Neurologic: Protective sensation absent bilateral.  Musculosketal: There is hammertoe deformity.  Status post left fifth toe and distal metatarsal amputation.  Minimal pain to palpation to left foot.  No pain with compression to calves   Xrays, left foot: To fourth toe no bony destruction suggestive of osteomyelitis. No gas in soft tissues.   No results for input(s): GRAMSTAIN, LABORGA in the last 8760 hours.  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 -Xrays reviewed - Excisionally dedbrided ulceration at left fourth toe to healthy bleeding borders removing nonviable tissue using a sterile chisel blade. Wound measures  post debridement as above.  Wound was debrided to the level of the dermis with viable wound base exposed to promote healing. Hemostasis was achieved with manuel pressure. Patient tolerated procedure well without any discomfort or anesthesia necessary for this wound debridement.  -Applied Iodosorb and Band-Aid dressing advised patient to do the same at home daily  -Prescribed Bactrim for patient to take as directed - Advised patient to go to the ER or return to office if the wound worsens or if constitutional symptoms are present. -Advised patient to closely monitor her temperature and her symptoms over the weekend -Work excuse provided and advised patient to refrain from going to work this weekend to prevent rubbing when she is in a shoe states she cannot wear surgical shoe at work as I have dispensed this visit -Patient to return to office in 1 week for follow up care and evaluation or sooner if problems arise.  Landis Martins, DPM

## 2021-06-25 ENCOUNTER — Ambulatory Visit (INDEPENDENT_AMBULATORY_CARE_PROVIDER_SITE_OTHER): Payer: 59 | Admitting: Sports Medicine

## 2021-06-25 ENCOUNTER — Other Ambulatory Visit: Payer: Self-pay

## 2021-06-25 ENCOUNTER — Encounter: Payer: Self-pay | Admitting: Sports Medicine

## 2021-06-25 DIAGNOSIS — S98132A Complete traumatic amputation of one left lesser toe, initial encounter: Secondary | ICD-10-CM

## 2021-06-25 DIAGNOSIS — E1142 Type 2 diabetes mellitus with diabetic polyneuropathy: Secondary | ICD-10-CM

## 2021-06-25 DIAGNOSIS — L97521 Non-pressure chronic ulcer of other part of left foot limited to breakdown of skin: Secondary | ICD-10-CM

## 2021-06-25 DIAGNOSIS — M79672 Pain in left foot: Secondary | ICD-10-CM

## 2021-06-25 NOTE — Progress Notes (Signed)
Subjective: Maria Morris is a 40 y.o. female patient seen in office for follow up evaluation of ulceration of the left fourth toe.  Patient reports that things are better. Got new shoes and is assisted by Husband.  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/2019   Hypertension associated with diabetes ([place]) 11/27/2019   Amenorrhea 11/14/2019   Class 3 severe obesity due to excess calories with serious comorbidity and body mass index (BMI) of 50.0 to 59.9 in adult [facility]) 11/14/2019   GAD (generalized anxiety disorder) 11/14/2019   Moderate episode of recurrent major depressive disorder ([place]) 11/14/2019   Palpitations 11/14/2019   Polyarthralgia 11/14/2019   Well woman exam with routine gynecological exam 07/26/2019   Breast pain, left 07/26/2019   Current Outpatient Medications on File Prior to Visit  Medication Sig Dispense Refill   acetaminophen (TYLENOL) 500 MG tablet Take 1,000-2,000 mg by mouth 3 (three) times daily as needed for headache (migraine).      amoxicillin-clavulanate (AUGMENTIN) 875-125 MG tablet Take 1 tablet by mouth 2 (two) times daily. 28 tablet 0   aspirin-acetaminophen-caffeine (EXCEDRIN MIGRAINE) 250-250-65 MG tablet Take 2 tablets by mouth every 6 (six) hours as needed for headache or migraine.     Blood Glucose Monitoring Suppl (TRUE METRIX METER) w/Device KIT 1 each by Does not apply route 2 (two) times a day. 1 kit 0   buPROPion (WELLBUTRIN XL) 150 MG 24 hr tablet Take 150 mg by mouth daily.      Continuous Blood Gluc Receiver (DEXCOM G6  RECEIVER) DEVI      Continuous Blood Gluc Sensor (DEXCOM G6 SENSOR) MISC Inject 1 Device into the skin every 14 (fourteen) days.      Continuous Blood Gluc Transmit (DEXCOM G6 TRANSMITTER) MISC See admin instructions.     cyclobenzaprine (FLEXERIL) 10 MG tablet Take 10 mg by mouth 2 (two) times daily as needed for muscle spasms.     diclofenac Sodium (VOLTAREN) 1 % GEL SMARTSIG:2 Gram(s) Topical 4 Times Daily PRN     DULoxetine (CYMBALTA) 30 MG capsule Take 30 mg by mouth daily.     ferrous gluconate (FERGON) 324 MG tablet Take 324 mg by mouth daily with breakfast.     fexofenadine (ALLEGRA) 180 MG tablet Take 180 mg by mouth daily.     fluticasone (FLONASE) 50 MCG/ACT nasal spray SMARTSIG:1 Spray(s) Both Nares Daily PRN     gabapentin (NEURONTIN) 300 MG capsule Take 300 mg by mouth 3 (three) times daily.     gabapentin (NEURONTIN) 400 MG capsule Take 400 mg by mouth 3 (three) times daily.     gabapentin (NEURONTIN) 600 MG tablet Take 600 mg by mouth 3 (three) times daily.     gabapentin (NEURONTIN) 800 MG tablet Take 800 mg by mouth in the morning, at noon, and at bedtime.      gabapentin (NEURONTIN) 800 MG tablet Take by mouth.     glipiZIDE (GLUCOTROL) 10 MG tablet Take 1 tablet (10 mg total) by mouth 2 (two) times daily before a meal. 60 tablet 3   glucose blood (TRUE METRIX BLOOD  GLUCOSE TEST) test strip Use as instructed (Patient taking differently: 1 each by Other route as directed.) 100 each 12   hydrOXYzine (ATARAX/VISTARIL) 10 MG tablet Take by mouth.     ibuprofen (ADVIL) 800 MG tablet TAKE 1 TABLET(800 MG) BY MOUTH EVERY 8 HOURS AS NEEDED 30 tablet 0   indomethacin (INDOCIN) 50 MG capsule Take 50 mg by mouth 2 (two) times daily with a meal.     insulin glargine (LANTUS SOLOSTAR) 100 UNIT/ML Solostar Pen Inject 50 Units into the skin daily. (Patient taking differently: Inject 50 Units into the skin at bedtime.) 15 mL 2   Insulin Pen Needle (TRUEPLUS PEN NEEDLES) 32G X 4 MM MISC Use as  instructed. (Patient taking differently: 1 each by Other route as directed.) 100 each 6   lisinopril (ZESTRIL) 20 MG tablet Take 20 mg by mouth daily.     meloxicam (MOBIC) 15 MG tablet Take 15 mg by mouth daily.     metFORMIN (GLUCOPHAGE) 500 MG tablet Take 1 tablet (500 mg total) by mouth 2 (two) times daily with a meal. 60 tablet 2   montelukast (SINGULAIR) 10 MG tablet Take 10 mg by mouth daily.     neomycin-polymyxin-hydrocortisone (CORTISPORIN) OTIC solution Apply 2-3 drops to right big toe once a day after shower 10 mL 0   ondansetron (ZOFRAN-ODT) 8 MG disintegrating tablet Take by mouth.     pantoprazole (PROTONIX) 40 MG tablet Take 1 tablet (40 mg total) by mouth daily. (Patient taking differently: Take 40 mg by mouth daily before breakfast.) 30 tablet 2   rizatriptan (MAXALT) 10 MG tablet Take by mouth.     sulfamethoxazole-trimethoprim (BACTRIM) 400-80 MG tablet Take 1 tablet by mouth 2 (two) times daily. 28 tablet 0   topiramate (TOPAMAX) 100 MG tablet      topiramate (TOPAMAX) 25 MG tablet Take 25 mg by mouth at bedtime.      triamcinolone (KENALOG) 0.1 % Apply topically 2 (two) times daily.     TRUEplus Lancets 28G MISC 1 each by Does not apply route 2 (two) times a day. 732 each 1   TRULICITY 3 KG/2.5KY SOPN SMARTSIG:3 Milligram(s) SUB-Q Once a Week     Vitamin D, Ergocalciferol, (DRISDOL) 1.25 MG (50000 UNIT) CAPS capsule Take 50,000 Units by mouth every Monday.      [DISCONTINUED] sodium chloride (OCEAN) 0.65 % SOLN nasal spray Place 1 spray into both nostrils as needed for congestion. (Patient not taking: Reported on 03/13/2019) 1 Bottle 0   No current facility-administered medications on file prior to visit.   No Known Allergies  No results found for this or any previous visit (from the past 2160 hour(s)).  Objective: There were no vitals filed for this visit.  General: Patient is awake, alert, oriented x 3 and in no acute distress.  Dermatology: Skin is warm and dry  bilateral with a partial thickness ulceration present left 4th toe. Ulceration measures 0.3 cm x 0.3 cm x 0.1 cm. There is a keratotic border with a granular base. The ulceration does not probe to bone. There is no malodor, no active drainage, no erythema, but mild periwound hyperpigmentation, focal edema to the left foot without warmth that is better from before. No other acute signs of infection.  Old surgical scar or previous left fifth toe amputation stump site well-healed.   Vascular: Dorsalis Pedis pulse = 1/4 Bilateral,  Posterior Tibial pulse = 1/4 Bilateral,  Capillary Fill Time < 5 seconds  Neurologic: Protective sensation absent  bilateral.  Musculosketal: There is hammertoe deformity.  Status post left fifth toe and distal metatarsal amputation.  Minimal pain to palpation to left foot.  No pain with compression to calves    No results for input(s): GRAMSTAIN, LABORGA in the last 8760 hours.  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 - Excisionally dedbrided ulceration at left fourth toe to healthy bleeding borders removing nonviable tissue using a sterile chisel blade. Wound measures post debridement as above.  Wound was debrided to the level of the dermis with viable wound base exposed to promote healing. Hemostasis was achieved with manuel pressure. Patient tolerated procedure well without any discomfort or anesthesia necessary for this wound debridement.  -Applied Iodosorb and Band-Aid dressing advised patient to do the same at home daily  -Continue with Bactrim until completed - Advised patient to go  to the ER or return to office if the wound worsens or if constitutional symptoms are present. -Continue with shoes that do no rub -Patient to return to office in 2 weeks for follow up wound care and evaluation or sooner if problems arise.  Landis Martins, DPM

## 2021-07-09 ENCOUNTER — Ambulatory Visit (INDEPENDENT_AMBULATORY_CARE_PROVIDER_SITE_OTHER): Payer: 59 | Admitting: Sports Medicine

## 2021-07-09 ENCOUNTER — Other Ambulatory Visit: Payer: Self-pay

## 2021-07-09 ENCOUNTER — Encounter: Payer: Self-pay | Admitting: Sports Medicine

## 2021-07-09 DIAGNOSIS — M79672 Pain in left foot: Secondary | ICD-10-CM

## 2021-07-09 DIAGNOSIS — L97521 Non-pressure chronic ulcer of other part of left foot limited to breakdown of skin: Secondary | ICD-10-CM | POA: Diagnosis not present

## 2021-07-09 DIAGNOSIS — E1142 Type 2 diabetes mellitus with diabetic polyneuropathy: Secondary | ICD-10-CM

## 2021-07-09 NOTE — Progress Notes (Signed)
Subjective: Maria Morris is a 40 y.o. female patient seen in office for follow up evaluation of ulceration of the left fourth toe.  Patient reports that she has some swelling and redness and has completed all her antibiotics.   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) 11/14/2019   Palpitations 11/14/2019   Polyarthralgia 11/14/2019   Well woman exam with routine gynecological exam 07/26/2019   Breast pain, left 07/26/2019   Current Outpatient Medications on File Prior to Visit  Medication Sig Dispense Refill   betamethasone acetate-betamethasone sodium phosphate (CELESTONE) 6 (3-3) MG/ML injection      acetaminophen (TYLENOL) 500 MG tablet Take 1,000-2,000 mg by mouth 3 (three) times daily as needed for headache (migraine).      amoxicillin-clavulanate (AUGMENTIN) 875-125 MG tablet Take 1 tablet by mouth 2 (two) times daily. 28 tablet 0   aspirin-acetaminophen-caffeine (EXCEDRIN MIGRAINE) 250-250-65 MG tablet Take 2 tablets by mouth every 6 (six) hours as needed for headache or migraine.     Blood Glucose Monitoring Suppl (TRUE METRIX METER) w/Device KIT 1 each by Does not apply route 2 (two) times a day. 1 kit 0   buPROPion (WELLBUTRIN  XL) 150 MG 24 hr tablet Take 150 mg by mouth daily.      Continuous Blood Gluc Receiver (DEXCOM G6 RECEIVER) DEVI      Continuous Blood Gluc Sensor (DEXCOM G6 SENSOR) MISC Inject 1 Device into the skin every 14 (fourteen) days.      Continuous Blood Gluc Transmit (DEXCOM G6 TRANSMITTER) MISC See admin instructions.     cyclobenzaprine (FLEXERIL) 10 MG tablet Take 10 mg by mouth 2 (two) times daily as needed for muscle spasms.     diclofenac Sodium (VOLTAREN) 1 % GEL SMARTSIG:2 Gram(s) Topical 4 Times Daily PRN     DULoxetine (CYMBALTA) 30 MG capsule Take 30 mg by mouth daily.     DULoxetine (CYMBALTA) 60 MG capsule Take 60 mg by mouth daily.     ferrous gluconate (FERGON) 324 MG tablet Take 324 mg by mouth daily with breakfast.     fexofenadine (ALLEGRA) 180 MG tablet Take 180 mg by mouth daily.     fluticasone (FLONASE) 50 MCG/ACT nasal spray SMARTSIG:1 Spray(s) Both Nares Daily PRN     gabapentin (NEURONTIN) 300 MG capsule Take 300 mg by mouth 3 (three) times daily.     gabapentin (NEURONTIN) 400 MG capsule Take 400 mg by mouth 3 (three) times daily.     gabapentin (NEURONTIN) 600 MG tablet Take 600 mg by mouth 3 (three) times daily.     gabapentin (NEURONTIN) 800 MG tablet Take 800 mg by mouth in the morning, at noon, and at bedtime.      gabapentin (NEURONTIN) 800 MG tablet Take by mouth.  glipiZIDE (GLUCOTROL) 10 MG tablet Take 1 tablet (10 mg total) by mouth 2 (two) times daily before a meal. 60 tablet 3   glucose blood (TRUE METRIX BLOOD GLUCOSE TEST) test strip Use as instructed (Patient taking differently: 1 each by Other route as directed.) 100 each 12   hydrOXYzine (ATARAX/VISTARIL) 10 MG tablet Take by mouth.     ibuprofen (ADVIL) 800 MG tablet TAKE 1 TABLET(800 MG) BY MOUTH EVERY 8 HOURS AS NEEDED 30 tablet 0   indomethacin (INDOCIN) 50 MG capsule Take 50 mg by mouth 2 (two) times daily with a meal.     insulin glargine (LANTUS SOLOSTAR) 100 UNIT/ML Solostar Pen Inject 50 Units  into the skin daily. (Patient taking differently: Inject 50 Units into the skin at bedtime.) 15 mL 2   Insulin Pen Needle (TRUEPLUS PEN NEEDLES) 32G X 4 MM MISC Use as instructed. (Patient taking differently: 1 each by Other route as directed.) 100 each 6   lisinopril (ZESTRIL) 20 MG tablet Take 20 mg by mouth daily.     meloxicam (MOBIC) 15 MG tablet Take 15 mg by mouth daily.     metFORMIN (GLUCOPHAGE) 500 MG tablet Take 1 tablet (500 mg total) by mouth 2 (two) times daily with a meal. 60 tablet 2   montelukast (SINGULAIR) 10 MG tablet Take 10 mg by mouth daily.     neomycin-polymyxin-hydrocortisone (CORTISPORIN) OTIC solution Apply 2-3 drops to right big toe once a day after shower 10 mL 0   ondansetron (ZOFRAN-ODT) 8 MG disintegrating tablet Take by mouth.     pantoprazole (PROTONIX) 40 MG tablet Take 1 tablet (40 mg total) by mouth daily. (Patient taking differently: Take 40 mg by mouth daily before breakfast.) 30 tablet 2   rizatriptan (MAXALT) 10 MG tablet Take by mouth.     sulfamethoxazole-trimethoprim (BACTRIM) 400-80 MG tablet Take 1 tablet by mouth 2 (two) times daily. 28 tablet 0   topiramate (TOPAMAX) 100 MG tablet      topiramate (TOPAMAX) 25 MG tablet Take 25 mg by mouth at bedtime.      triamcinolone (KENALOG) 0.1 % Apply topically 2 (two) times daily.     TRUEplus Lancets 28G MISC 1 each by Does not apply route 2 (two) times a day. 098 each 1   TRULICITY 3 JX/9.1YN SOPN SMARTSIG:3 Milligram(s) SUB-Q Once a Week     Vitamin D, Ergocalciferol, (DRISDOL) 1.25 MG (50000 UNIT) CAPS capsule Take 50,000 Units by mouth every Monday.      [DISCONTINUED] sodium chloride (OCEAN) 0.65 % SOLN nasal spray Place 1 spray into both nostrils as needed for congestion. (Patient not taking: Reported on 03/13/2019) 1 Bottle 0   No current facility-administered medications on file prior to visit.   No Known Allergies  No results found for this or any previous visit (from the past 2160  hour(s)).  Objective: There were no vitals filed for this visit.  General: Patient is awake, alert, oriented x 3 and in no acute distress.  Dermatology: Skin is warm and dry bilateral with a partial thickness ulceration present left 4th toe. Ulceration measures 0.3 cm x 0.3 cm x 0.1 cm, unchanged from prior. There is a keratotic border with a granular base. The ulceration does not probe to bone. There is no malodor, no active drainage, no erythema, but mild periwound hyperpigmentation, focal edema to the left foot without warmth that is better from before. No other acute signs of infection.  Old surgical scar or previous left fifth  toe amputation stump site well-healed.  Corn to right 5th toe   Vascular: Dorsalis Pedis pulse = 1/4 Bilateral,  Posterior Tibial pulse = 1/4 Bilateral,  Capillary Fill Time < 5 seconds  Neurologic: Protective sensation absent bilateral.  Musculosketal: There is hammertoe deformity.  Status post left fifth toe and distal metatarsal amputation.  Minimal pain to palpation to left foot.  No pain with compression to calves    No results for input(s): GRAMSTAIN, LABORGA in the last 8760 hours.  Assessment and Plan:  Problem List Items Addressed This Visit   None Visit Diagnoses     Skin ulcer of fourth toe of left foot, limited to breakdown of skin ([place])    -  Primary   Diabetic polyneuropathy associated with type 2 diabetes mellitus ([facility])       Relevant Medications   DULoxetine (CYMBALTA) 60 MG capsule   Left foot pain           -Examined patient and discussed the progression of the wound and treatment alternatives. - Excisionally dedbrided ulceration at left fourth toe to healthy bleeding borders removing nonviable tissue using a sterile chisel blade. Wound measures post debridement as above.  Wound was debrided to the level of the dermis with viable wound base exposed to promote healing. Hemostasis was achieved with manuel pressure. Patient tolerated  procedure well without any discomfort or anesthesia necessary for this wound debridement.  -Applied medihoney and Band-Aid dressing advised patient to do the same at home daily  - Advised patient to go to the ER or return to office if the wound worsens or if constitutional symptoms are present. -Continue with shoes that do no rub and surgical shoe -Recommend reducing hours at work -Patient to return to office in 2 weeks for follow up wound care and evaluation or sooner if problems arise.  Landis Martins, DPM

## 2021-07-14 ENCOUNTER — Encounter: Payer: Self-pay | Admitting: Sports Medicine

## 2021-07-14 ENCOUNTER — Other Ambulatory Visit: Payer: Self-pay | Admitting: Sports Medicine

## 2021-07-14 DIAGNOSIS — E1142 Type 2 diabetes mellitus with diabetic polyneuropathy: Secondary | ICD-10-CM

## 2021-07-14 NOTE — Progress Notes (Signed)
Rx sent to [facility] neurology for diabetic neuropathy

## 2021-07-15 ENCOUNTER — Encounter: Payer: Self-pay | Admitting: Neurology

## 2021-07-30 ENCOUNTER — Other Ambulatory Visit: Payer: Self-pay

## 2021-07-30 ENCOUNTER — Ambulatory Visit (INDEPENDENT_AMBULATORY_CARE_PROVIDER_SITE_OTHER): Payer: 59 | Admitting: Sports Medicine

## 2021-07-30 DIAGNOSIS — E1142 Type 2 diabetes mellitus with diabetic polyneuropathy: Secondary | ICD-10-CM

## 2021-07-30 DIAGNOSIS — M7989 Other specified soft tissue disorders: Secondary | ICD-10-CM

## 2021-07-30 DIAGNOSIS — M79672 Pain in left foot: Secondary | ICD-10-CM | POA: Diagnosis not present

## 2021-07-30 DIAGNOSIS — S98132A Complete traumatic amputation of one left lesser toe, initial encounter: Secondary | ICD-10-CM

## 2021-07-30 DIAGNOSIS — L97521 Non-pressure chronic ulcer of other part of left foot limited to breakdown of skin: Secondary | ICD-10-CM

## 2021-07-30 MED ORDER — GABAPENTIN 800 MG PO TABS: 800.0000 mg | ORAL_TABLET | Freq: Three times a day (TID) | ORAL | 2 refills | Status: AC

## 2021-07-30 NOTE — Progress Notes (Signed)
Subjective: Maria Morris is a 40 y.o. female patient seen in office for follow up evaluation of ulceration of the left fourth toe.  Patient reports that she has some swelling and very minimal drainage but denies nausea vomiting fever chills or any other constitutional symptoms admits that she does have pain burning and stinging sharp shooting and states that she has been off her gabapentin for several months and the pain has gotten worse.  Patient Active Problem List   Diagnosis Date Noted   Sepsis ([place]) 03/07/2020   AKI (acute kidney injury) ([place]) 03/07/2020   DM II (diabetes mellitus, type II), controlled ([place]) 03/06/2020   Osteomyelitis ([place]) 03/05/2020   Elevated C-reactive protein (CRP) 01/29/2020   Elevated sed rate 01/29/2020   Gastroesophageal reflux disease 01/29/2020   Myalgia 01/29/2020   Seasonal allergic rhinitis due to pollen 01/29/2020   Photosensitivity dermatitis 12/26/2019   Hypertension associated with diabetes ([place]) 11/27/2019   Amenorrhea 11/14/2019   Class 3 severe obesity due to excess calories with serious comorbidity and body mass index (BMI) of 50.0 to 59.9 in adult [facility]) 11/14/2019   GAD (generalized anxiety disorder) 11/14/2019   Moderate episode of recurrent major depressive disorder ([place]) 11/14/2019   Palpitations 11/14/2019   Polyarthralgia 11/14/2019   Well woman exam with routine gynecological exam 07/26/2019   Breast pain, left 07/26/2019   Current Outpatient Medications on File Prior to Visit  Medication Sig Dispense Refill   acetaminophen (TYLENOL) 500 MG tablet Take 1,000-2,000 mg by mouth 3 (three) times daily as needed for headache (migraine).      amoxicillin-clavulanate (AUGMENTIN) 875-125 MG tablet Take 1 tablet by mouth 2 (two) times daily. 28 tablet 0   aspirin-acetaminophen-caffeine (EXCEDRIN MIGRAINE) 250-250-65 MG tablet Take 2 tablets by mouth every 6 (six) hours as needed for headache or migraine.     betamethasone  acetate-betamethasone sodium phosphate (CELESTONE) 6 (3-3) MG/ML injection      Blood Glucose Monitoring Suppl (TRUE METRIX METER) w/Device KIT 1 each by Does not apply route 2 (two) times a day. 1 kit 0   buPROPion (WELLBUTRIN XL) 150 MG 24 hr tablet Take 150 mg by mouth daily.      Continuous Blood Gluc Receiver (DEXCOM G6 RECEIVER) DEVI      Continuous Blood Gluc Sensor (DEXCOM G6 SENSOR) MISC Inject 1 Device into the skin every 14 (fourteen) days.      Continuous Blood Gluc Transmit (DEXCOM G6 TRANSMITTER) MISC See admin instructions.     cyclobenzaprine (FLEXERIL) 10 MG tablet Take 10 mg by mouth 2 (two) times daily as needed for muscle spasms.     diclofenac Sodium (VOLTAREN) 1 % GEL SMARTSIG:2 Gram(s) Topical 4 Times Daily PRN     DULoxetine (CYMBALTA) 30 MG capsule Take 30 mg by mouth daily.     DULoxetine (CYMBALTA) 60 MG capsule Take 60 mg by mouth daily.     ferrous gluconate (FERGON) 324 MG tablet Take 324 mg by mouth daily with breakfast.     fexofenadine (ALLEGRA) 180 MG tablet Take 180 mg by mouth daily.     fluticasone (FLONASE) 50 MCG/ACT nasal spray SMARTSIG:1 Spray(s) Both Nares Daily PRN     gabapentin (NEURONTIN) 300 MG capsule Take 300 mg by mouth 3 (three) times daily.     gabapentin (NEURONTIN) 400 MG capsule Take 400 mg by mouth 3 (three) times daily.     gabapentin (NEURONTIN) 600 MG tablet Take 600 mg by mouth 3 (three) times daily.  gabapentin (NEURONTIN) 800 MG tablet Take by mouth.     glipiZIDE (GLUCOTROL) 10 MG tablet Take 1 tablet (10 mg total) by mouth 2 (two) times daily before a meal. 60 tablet 3   glucose blood (TRUE METRIX BLOOD GLUCOSE TEST) test strip Use as instructed (Patient taking differently: 1 each by Other route as directed.) 100 each 12   hydrOXYzine (ATARAX/VISTARIL) 10 MG tablet Take by mouth.     ibuprofen (ADVIL) 800 MG tablet TAKE 1 TABLET(800 MG) BY MOUTH EVERY 8 HOURS AS NEEDED 30 tablet 0   indomethacin (INDOCIN) 50 MG capsule Take 50 mg  by mouth 2 (two) times daily with a meal.     insulin glargine (LANTUS SOLOSTAR) 100 UNIT/ML Solostar Pen Inject 50 Units into the skin daily. (Patient taking differently: Inject 50 Units into the skin at bedtime.) 15 mL 2   Insulin Pen Needle (TRUEPLUS PEN NEEDLES) 32G X 4 MM MISC Use as instructed. (Patient taking differently: 1 each by Other route as directed.) 100 each 6   lisinopril (ZESTRIL) 20 MG tablet Take 20 mg by mouth daily.     meloxicam (MOBIC) 15 MG tablet Take 15 mg by mouth daily.     metFORMIN (GLUCOPHAGE) 500 MG tablet Take 1 tablet (500 mg total) by mouth 2 (two) times daily with a meal. 60 tablet 2   montelukast (SINGULAIR) 10 MG tablet Take 10 mg by mouth daily.     neomycin-polymyxin-hydrocortisone (CORTISPORIN) OTIC solution Apply 2-3 drops to right big toe once a day after shower 10 mL 0   ondansetron (ZOFRAN-ODT) 8 MG disintegrating tablet Take by mouth.     pantoprazole (PROTONIX) 40 MG tablet Take 1 tablet (40 mg total) by mouth daily. (Patient taking differently: Take 40 mg by mouth daily before breakfast.) 30 tablet 2   rizatriptan (MAXALT) 10 MG tablet Take by mouth.     sulfamethoxazole-trimethoprim (BACTRIM) 400-80 MG tablet Take 1 tablet by mouth 2 (two) times daily. 28 tablet 0   topiramate (TOPAMAX) 100 MG tablet      topiramate (TOPAMAX) 25 MG tablet Take 25 mg by mouth at bedtime.      triamcinolone (KENALOG) 0.1 % Apply topically 2 (two) times daily.     TRUEplus Lancets 28G MISC 1 each by Does not apply route 2 (two) times a day. 784 each 1   TRULICITY 3 ON/6.2XB SOPN SMARTSIG:3 Milligram(s) SUB-Q Once a Week     Vitamin D, Ergocalciferol, (DRISDOL) 1.25 MG (50000 UNIT) CAPS capsule Take 50,000 Units by mouth every Monday.      [DISCONTINUED] sodium chloride (OCEAN) 0.65 % SOLN nasal spray Place 1 spray into both nostrils as needed for congestion. (Patient not taking: Reported on 03/13/2019) 1 Bottle 0   No current facility-administered medications on file  prior to visit.   No Known Allergies  No results found for this or any previous visit (from the past 2160 hour(s)).  Objective: There were no vitals filed for this visit.  General: Patient is awake, alert, oriented x 3 and in no acute distress.  Dermatology: Skin is warm and dry bilateral with a partial thickness ulceration present left 4th toe. Ulceration measures 0.5 cm x 0.7 cm x 0.1 cm slightly larger.  There is a keratotic border with a granular base. The ulceration does not probe to bone. There is no malodor, no active drainage, no erythema, but mild periwound hyperpigmentation, focal edema to the left foot without warmth.  No other acute signs of infection.  Old surgical scar or previous left fifth toe amputation stump site well-healed.  Corn to right 5th toe   Vascular: Dorsalis Pedis pulse = 1/4 Bilateral,  Posterior Tibial pulse = 1/4 Bilateral,  Capillary Fill Time < 5 seconds  Neurologic: Protective sensation absent bilateral.  Musculosketal: There is hammertoe deformity.  Status post left fifth toe and distal metatarsal amputation.  Minimal pain to palpation to left foot.  No pain with compression to calves    No results for input(s): GRAMSTAIN, LABORGA in the last 8760 hours.  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 -Cleansed ulceration there was minimal keratosis at this visit so no debridement performed.  Applied Prisma and Band-Aid dressing advised patient to do the same at home daily  -Ordered MRI for further evaluation to rule out osteomyelitis at the left fourth toe patient has chronic wound and swelling - Advised patient to go to the ER or return to office if the wound worsens or if constitutional symptoms are present. -Continue with shoes that do no rub or surgical shoe as previously dispensed  -Rx gabapentin 800 mg 3 times daily for patient to resume taking for likely neuropathic pain until she can see the neurologist -Patient to return to office after MRI and evaluation or sooner if problems arise.  Landis Martins, DPM

## 2021-08-07 ENCOUNTER — Emergency Department (HOSPITAL_COMMUNITY): Payer: Managed Care, Other (non HMO)

## 2021-08-07 ENCOUNTER — Other Ambulatory Visit: Payer: Self-pay

## 2021-08-07 ENCOUNTER — Encounter (HOSPITAL_COMMUNITY): Payer: Self-pay | Admitting: Internal Medicine

## 2021-08-07 ENCOUNTER — Inpatient Hospital Stay (HOSPITAL_COMMUNITY)
Admission: EM | Admit: 2021-08-07 | Discharge: 2021-08-09 | DRG: 617 | Disposition: A | Discharge status: Home / Self Care | Payer: Managed Care, Other (non HMO) | Attending: Internal Medicine | Admitting: Internal Medicine

## 2021-08-07 ENCOUNTER — Ambulatory Visit
Admission: EM | Admit: 2021-08-07 | Discharge: 2021-08-07 | Disposition: A | Discharge status: Home / Self Care | Payer: Managed Care, Other (non HMO)

## 2021-08-07 DIAGNOSIS — F411 Generalized anxiety disorder: Secondary | ICD-10-CM | POA: Diagnosis present

## 2021-08-07 DIAGNOSIS — L97529 Non-pressure chronic ulcer of other part of left foot with unspecified severity: Secondary | ICD-10-CM | POA: Diagnosis not present

## 2021-08-07 DIAGNOSIS — E11621 Type 2 diabetes mellitus with foot ulcer: Secondary | ICD-10-CM | POA: Diagnosis present

## 2021-08-07 DIAGNOSIS — Z20822 Contact with and (suspected) exposure to covid-19: Secondary | ICD-10-CM | POA: Diagnosis present

## 2021-08-07 DIAGNOSIS — E1169 Type 2 diabetes mellitus with other specified complication: Principal | ICD-10-CM | POA: Diagnosis present

## 2021-08-07 DIAGNOSIS — Z87891 Personal history of nicotine dependence: Secondary | ICD-10-CM

## 2021-08-07 DIAGNOSIS — M869 Osteomyelitis, unspecified: Secondary | ICD-10-CM | POA: Diagnosis not present

## 2021-08-07 DIAGNOSIS — Z833 Family history of diabetes mellitus: Secondary | ICD-10-CM

## 2021-08-07 DIAGNOSIS — Z8249 Family history of ischemic heart disease and other diseases of the circulatory system: Secondary | ICD-10-CM

## 2021-08-07 DIAGNOSIS — F419 Anxiety disorder, unspecified: Secondary | ICD-10-CM | POA: Diagnosis present

## 2021-08-07 DIAGNOSIS — G629 Polyneuropathy, unspecified: Secondary | ICD-10-CM | POA: Diagnosis present

## 2021-08-07 DIAGNOSIS — Z91120 Patient's intentional underdosing of medication regimen due to financial hardship: Secondary | ICD-10-CM

## 2021-08-07 DIAGNOSIS — T383X6A Underdosing of insulin and oral hypoglycemic [antidiabetic] drugs, initial encounter: Secondary | ICD-10-CM | POA: Diagnosis present

## 2021-08-07 DIAGNOSIS — E1142 Type 2 diabetes mellitus with diabetic polyneuropathy: Secondary | ICD-10-CM | POA: Diagnosis present

## 2021-08-07 DIAGNOSIS — F32A Depression, unspecified: Secondary | ICD-10-CM | POA: Diagnosis present

## 2021-08-07 DIAGNOSIS — Z803 Family history of malignant neoplasm of breast: Secondary | ICD-10-CM

## 2021-08-07 DIAGNOSIS — I10 Essential (primary) hypertension: Secondary | ICD-10-CM | POA: Diagnosis present

## 2021-08-07 DIAGNOSIS — Z79899 Other long term (current) drug therapy: Secondary | ICD-10-CM

## 2021-08-07 DIAGNOSIS — Z825 Family history of asthma and other chronic lower respiratory diseases: Secondary | ICD-10-CM

## 2021-08-07 DIAGNOSIS — Z89422 Acquired absence of other left toe(s): Secondary | ICD-10-CM

## 2021-08-07 DIAGNOSIS — K219 Gastro-esophageal reflux disease without esophagitis: Secondary | ICD-10-CM | POA: Diagnosis present

## 2021-08-07 DIAGNOSIS — M86672 Other chronic osteomyelitis, left ankle and foot: Secondary | ICD-10-CM | POA: Diagnosis not present

## 2021-08-07 DIAGNOSIS — Y92009 Unspecified place in unspecified non-institutional (private) residence as the place of occurrence of the external cause: Secondary | ICD-10-CM

## 2021-08-07 DIAGNOSIS — Z6841 Body Mass Index (BMI) 40.0 and over, adult: Secondary | ICD-10-CM

## 2021-08-07 LAB — I-STAT BETA HCG BLOOD, ED (MC, WL, AP ONLY): I-stat hCG, quantitative: <5 m[IU]/mL (ref <5)

## 2021-08-07 LAB — COMPREHENSIVE METABOLIC PANEL
ALT: 12 U/L (ref 0–44)
AST: 16 U/L (ref 15–41)
Albumin: 3.3 g/dL — ABNORMAL LOW (ref 3.5–5.0)
Alkaline Phosphatase: 68 U/L (ref 38–126)
Anion gap: 8 (ref 5–15)
BUN: 15 mg/dL (ref 6–20)
CO2: 28 mmol/L (ref 22–32)
Calcium: 8.9 mg/dL (ref 8.9–10.3)
Chloride: 100 mmol/L (ref 98–111)
Creatinine, Ser: 0.79 mg/dL (ref 0.44–1.00)
GFR, Estimated: >60 mL/min (ref >60)
Glucose, Bld: 195 mg/dL — ABNORMAL HIGH (ref 70–99)
Potassium: 3.9 mmol/L (ref 3.5–5.1)
Sodium: 136 mmol/L (ref 135–145)
Total Bilirubin: 0.5 mg/dL (ref 0.3–1.2)
Total Protein: 6.7 g/dL (ref 6.5–8.1)

## 2021-08-07 LAB — LIPID PANEL
Cholesterol: 191 mg/dL (ref 0–200)
HDL: 52 mg/dL (ref >40)
LDL Cholesterol: 61 mg/dL (ref 0–99)
Total CHOL/HDL Ratio: 3.7 RATIO
Triglycerides: 390 mg/dL — ABNORMAL HIGH (ref <150)
VLDL: 78 mg/dL — ABNORMAL HIGH (ref 0–40)

## 2021-08-07 LAB — URINALYSIS, ROUTINE W REFLEX MICROSCOPIC
Bilirubin Urine: NEGATIVE
Glucose, UA: 250 mg/dL — AB
Hgb urine dipstick: NEGATIVE
Ketones, ur: NEGATIVE mg/dL
Leukocytes,Ua: NEGATIVE
Nitrite: NEGATIVE
Protein, ur: NEGATIVE mg/dL
Specific Gravity, Urine: 1.02 (ref 1.005–1.030)
pH: 6.5 (ref 5.0–8.0)

## 2021-08-07 LAB — SEDIMENTATION RATE: Sed Rate: 45 mm/hr — ABNORMAL HIGH (ref 0–22)

## 2021-08-07 LAB — CBC WITH DIFFERENTIAL/PLATELET
Abs Immature Granulocytes: 0.05 10*3/uL (ref 0.00–0.07)
Basophils Absolute: 0 10*3/uL (ref 0.0–0.1)
Basophils Relative: 0 %
Eosinophils Absolute: 0.4 10*3/uL (ref 0.0–0.5)
Eosinophils Relative: 4 %
HCT: 37.6 % (ref 36.0–46.0)
Hemoglobin: 12 g/dL (ref 12.0–15.0)
Immature Granulocytes: 1 %
Lymphocytes Relative: 25 %
Lymphs Abs: 2.5 10*3/uL (ref 0.7–4.0)
MCH: 27.3 pg (ref 26.0–34.0)
MCHC: 31.9 g/dL (ref 30.0–36.0)
MCV: 85.5 fL (ref 80.0–100.0)
Monocytes Absolute: 0.8 10*3/uL (ref 0.1–1.0)
Monocytes Relative: 8 %
Neutro Abs: 6.1 10*3/uL (ref 1.7–7.7)
Neutrophils Relative %: 62 %
Platelets: 246 10*3/uL (ref 150–400)
RBC: 4.4 MIL/uL (ref 3.87–5.11)
RDW: 15 % (ref 11.5–15.5)
WBC: 9.8 10*3/uL (ref 4.0–10.5)
nRBC: 0 % (ref 0.0–0.2)

## 2021-08-07 LAB — LACTIC ACID, PLASMA
Lactic Acid, Venous: 1.5 mmol/L (ref 0.5–1.9)
Lactic Acid, Venous: 1.6 mmol/L (ref 0.5–1.9)

## 2021-08-07 LAB — CBG MONITORING, ED: Glucose-Capillary: 164 mg/dL — ABNORMAL HIGH (ref 70–99)

## 2021-08-07 LAB — RESP PANEL BY RT-PCR (FLU A&B, COVID) ARPGX2
Influenza A by PCR: NEGATIVE
Influenza B by PCR: NEGATIVE
SARS Coronavirus 2 by RT PCR: NEGATIVE

## 2021-08-07 LAB — C-REACTIVE PROTEIN: CRP: 2.4 mg/dL — ABNORMAL HIGH (ref <1.0)

## 2021-08-07 IMAGING — DX DG FOOT COMPLETE 3+V*L*
3 series · 3 of 3 positions shown · non-contrast
Comparison: [DATE].

CLINICAL DATA: Left foot wound.

EXAM:
LEFT FOOT - COMPLETE 3+ VIEW

[foot ap]
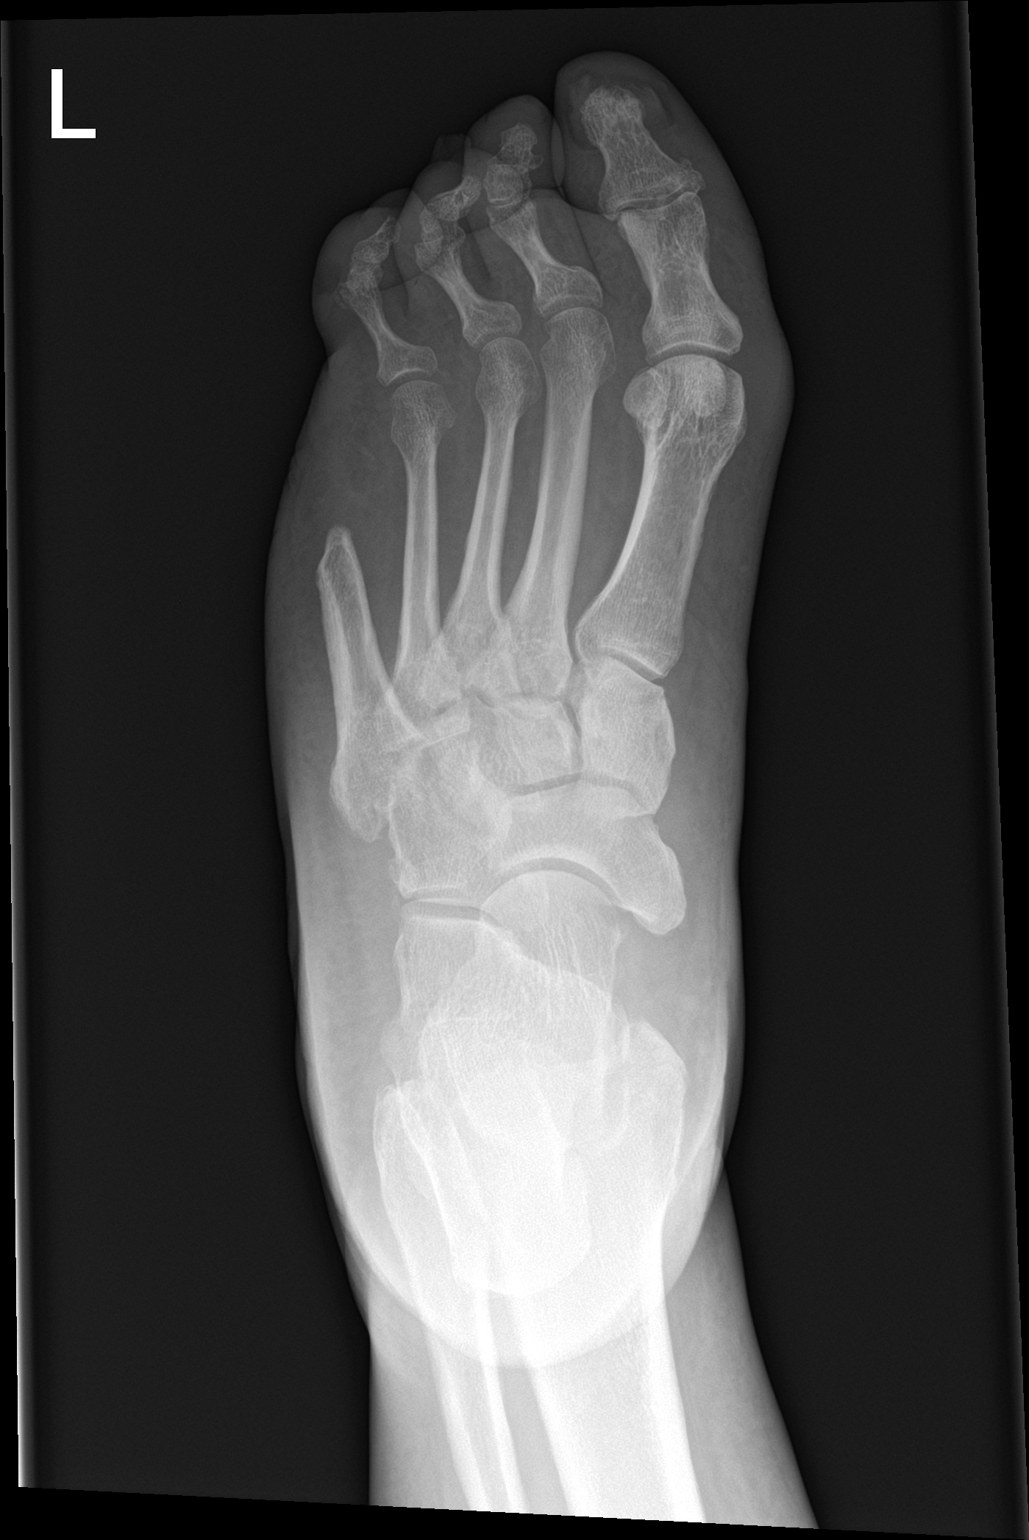

[foot obl]
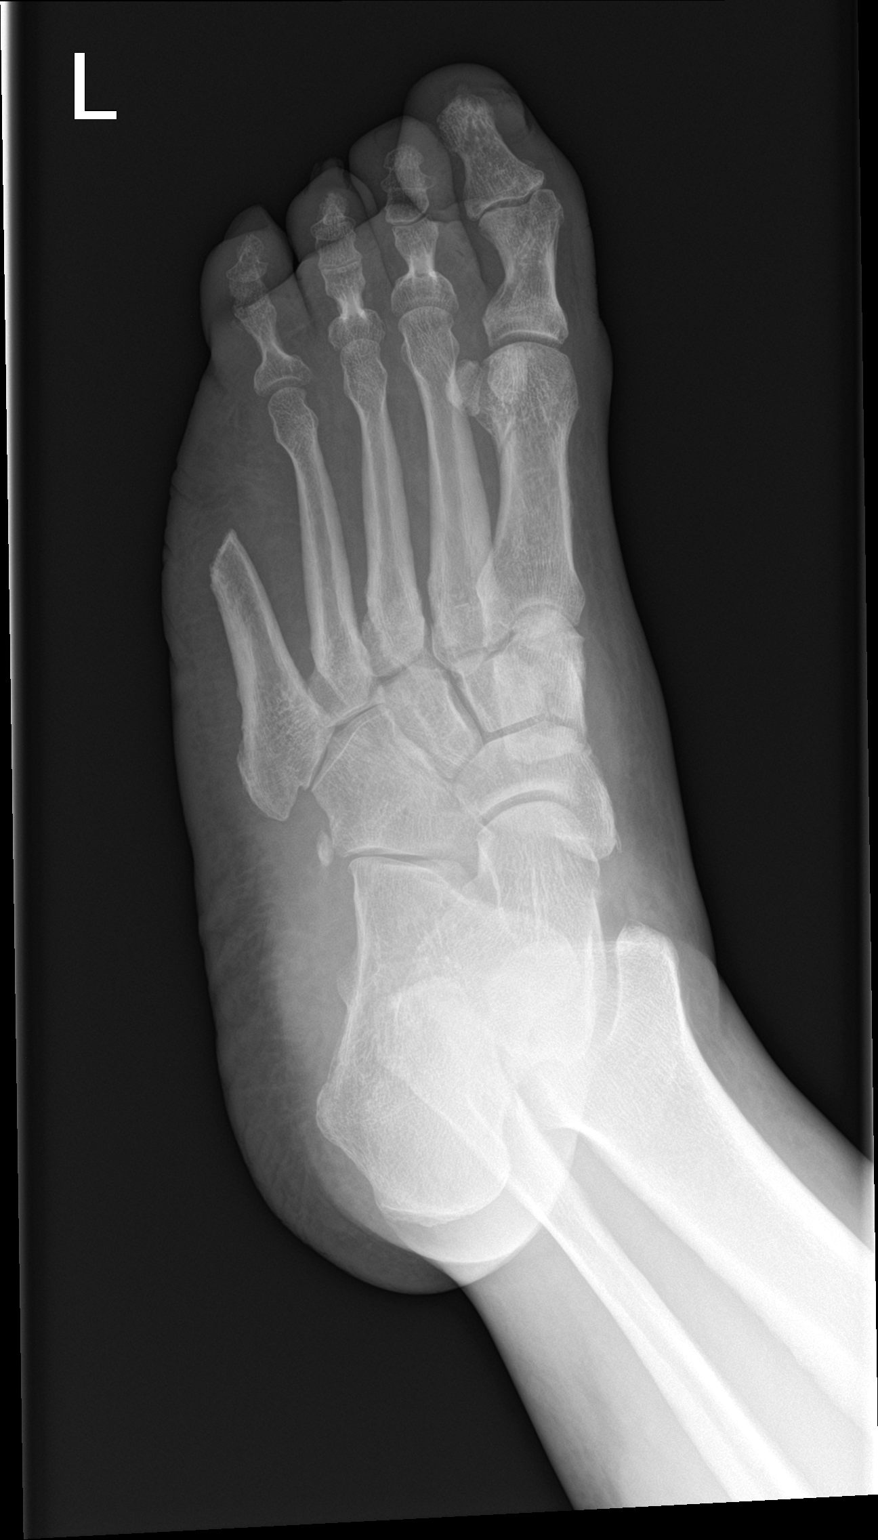

[foot lat]
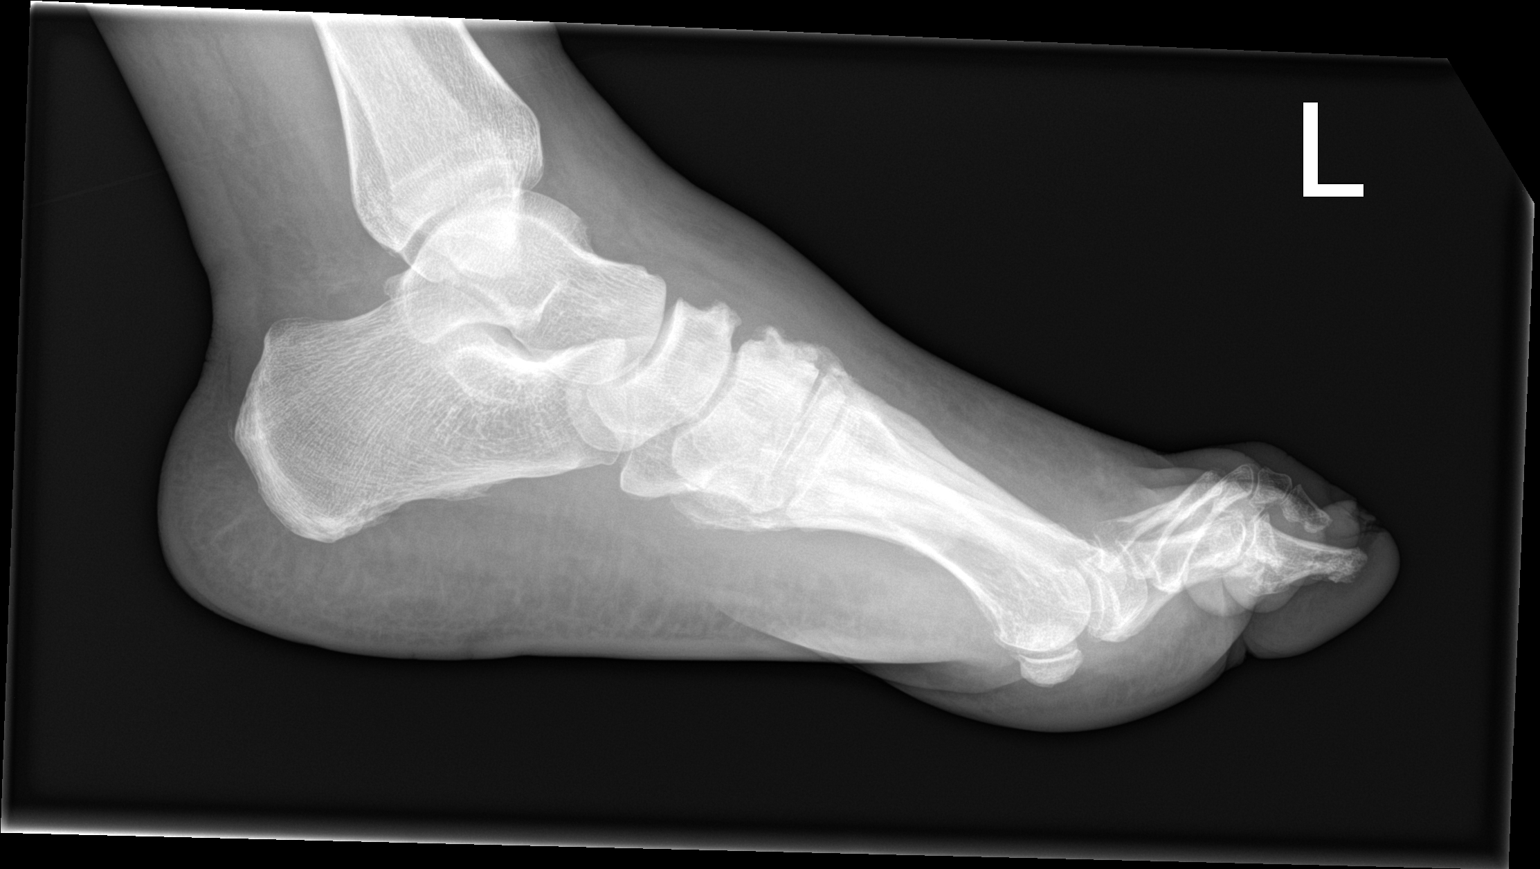

[3 of 3 positions shown; findings below may reference images not displayed]

FINDINGS: Status post surgical amputation of distal fifth metatarsal and
phalanges. There appears to be a soft tissue wound involving the
fourth toe overlying the fourth proximal interphalangeal joint
without definite evidence of underlying lytic destruction to suggest
osteomyelitis.
IMPRESSION: Postsurgical changes as described above. Soft tissue wound or
ulceration involving fourth toe without definite evidence of
underlying osteomyelitis.

## 2021-08-07 IMAGING — MR MR TOES*L* W/O CM
4 of 5 series · 19 of 40 positions shown · non-contrast
Comparison: Radiographs dated [DATE]

CLINICAL DATA: Fourth toe skin ulcer for 2-3 weeks, osteomyelitis
suspected.

EXAM:
MRI OF THE LEFT TOES WITHOUT CONTRAST
TECHNIQUE: Multiplanar, multisequence MR imaging of the left forefoot was
performed. No intravenous contrast was administered.

[Series 3: T1 · coronal · 3.0mm · 0.27mm/px · 3 of 35 slices shown (1 of 2)]
[im 4/35]
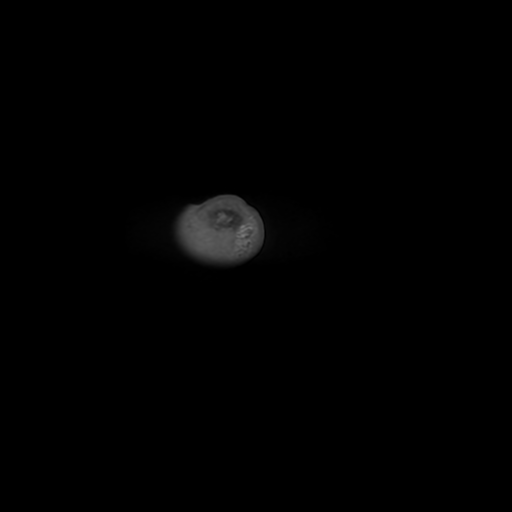
[im 18/35]
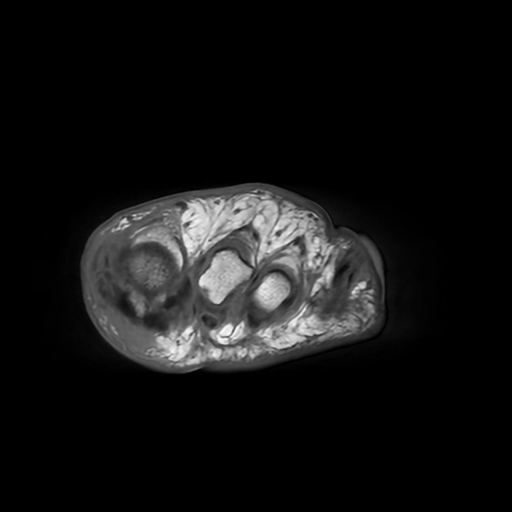
[im 31/35]
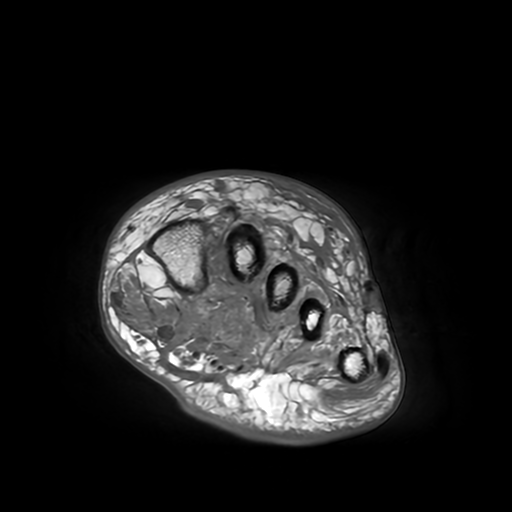

[Series 4: T2 fat-sat · coronal · 3.0mm · 0.27mm/px · 10 of 35 slices shown]
[im 1/35]
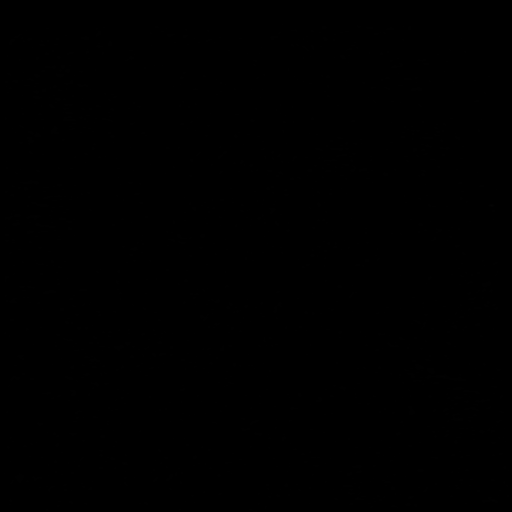
[im 4/35]
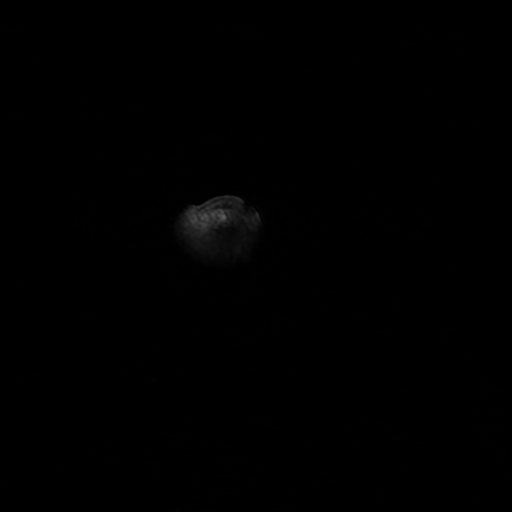
[im 7/35]
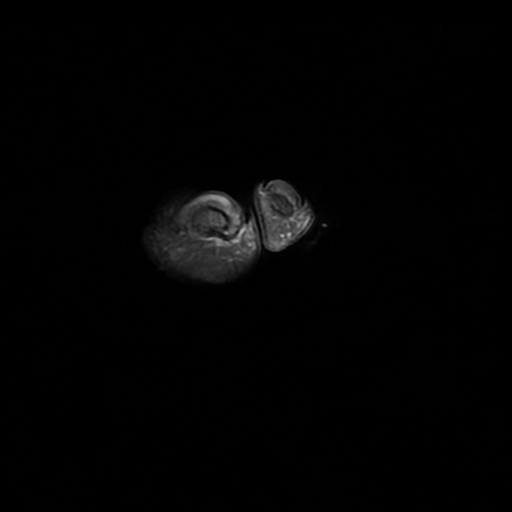
[im 11/35]
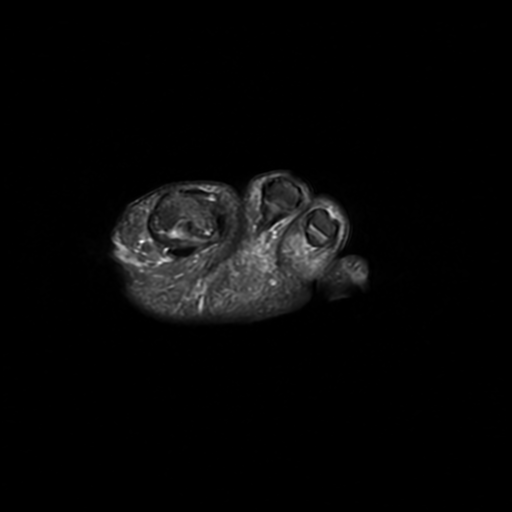
[im 14/35]
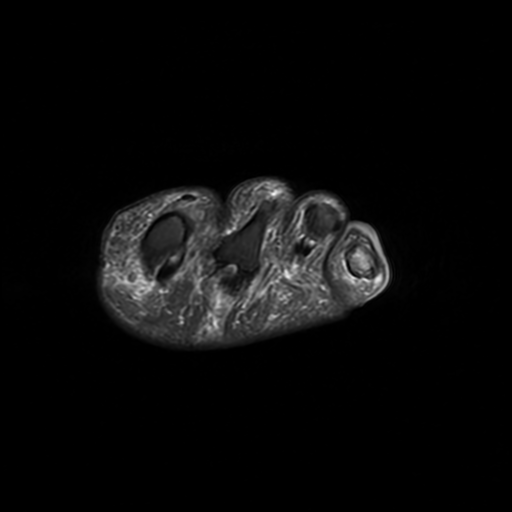
[im 18/35]
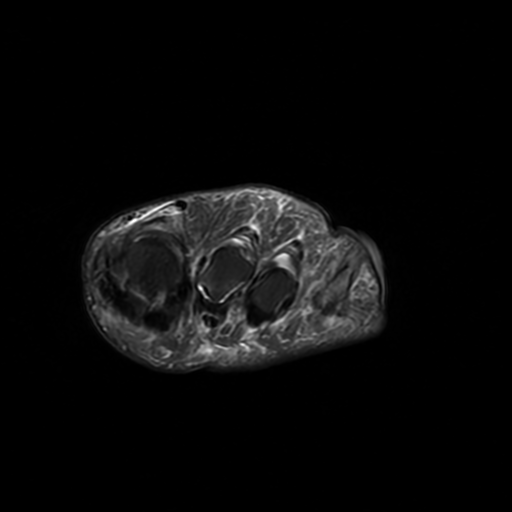
[im 21/35]
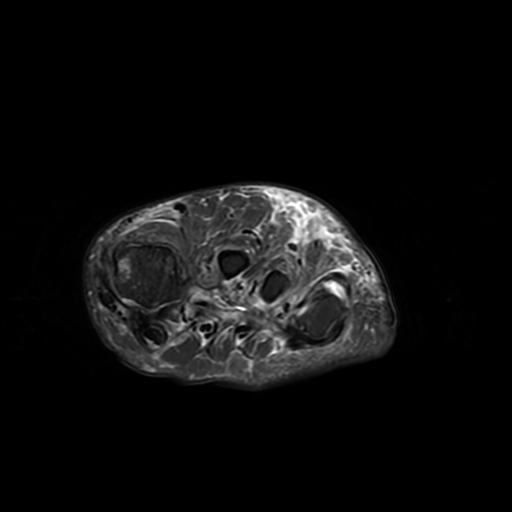
[im 24/35]
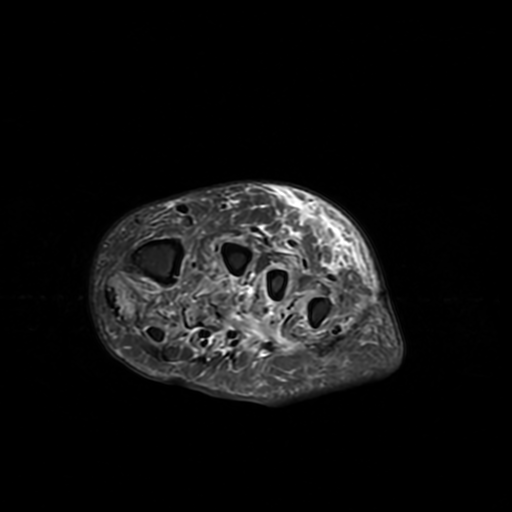
[im 28/35]
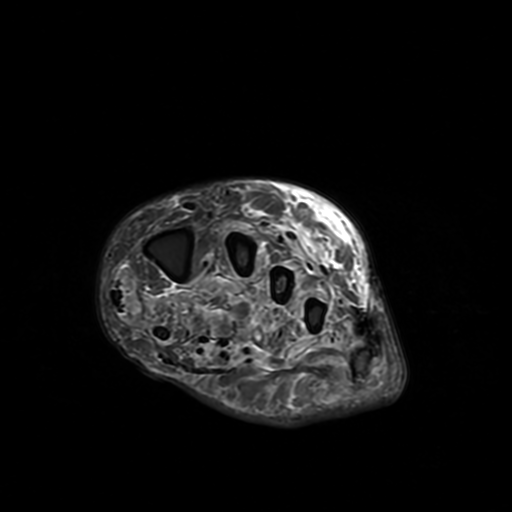
[im 31/35]
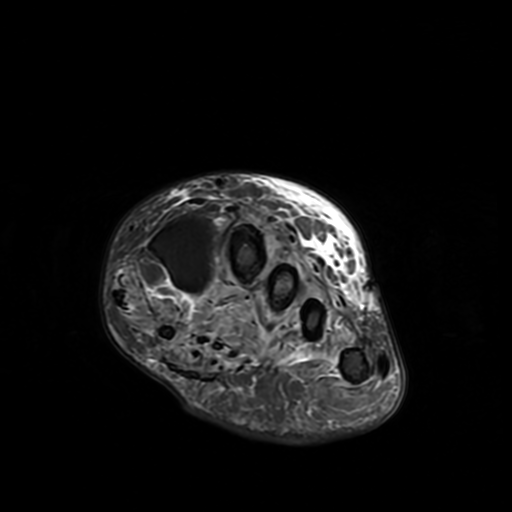

[Series 5: STIR · sagittal · 3.0mm · 0.31mm/px · 3 of 26 slices shown]
[im 4/26]
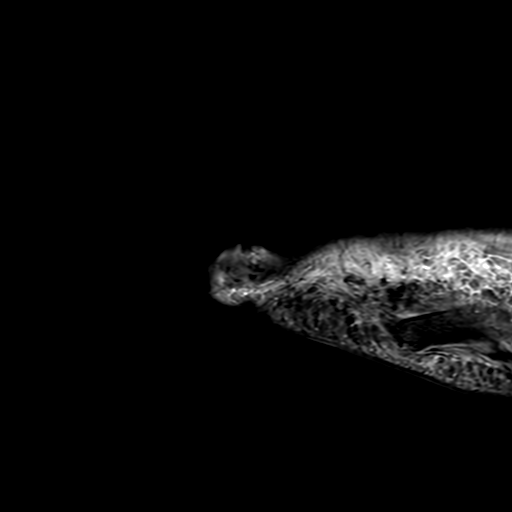
[im 15/26]
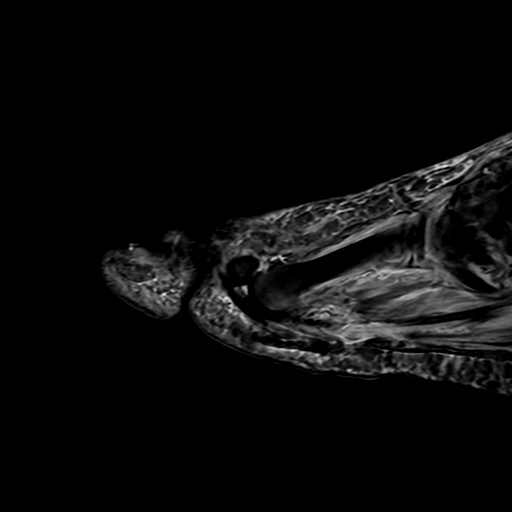
[im 22/26]
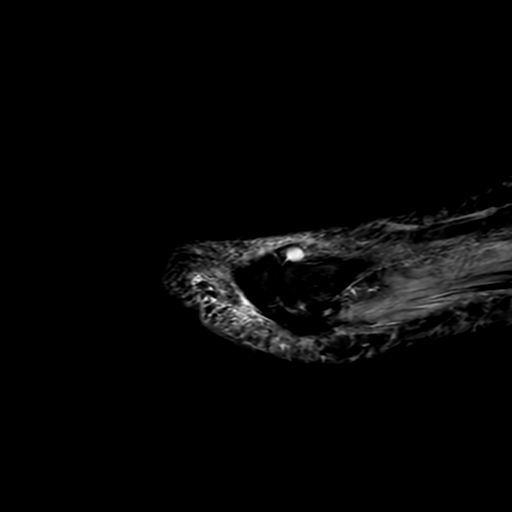

[Series 7: T1 · axial · 3.0mm · 0.27mm/px · z∈[-115,-61]mm · 3 of 18 slices shown (2 of 2)]
[im 1/18]
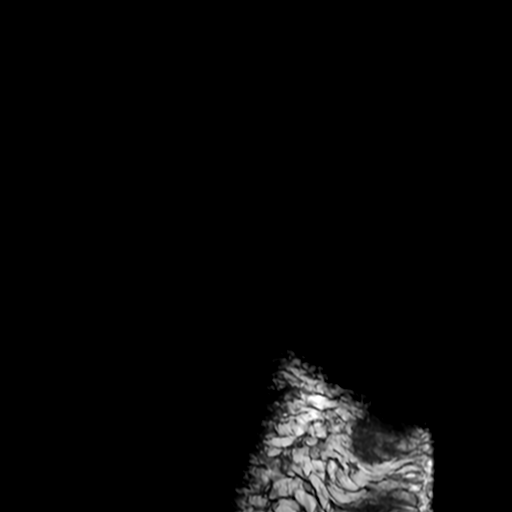
[im 9/18]
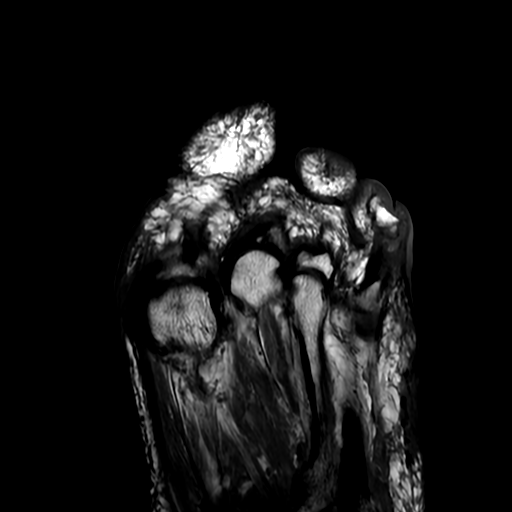
[im 18/18]
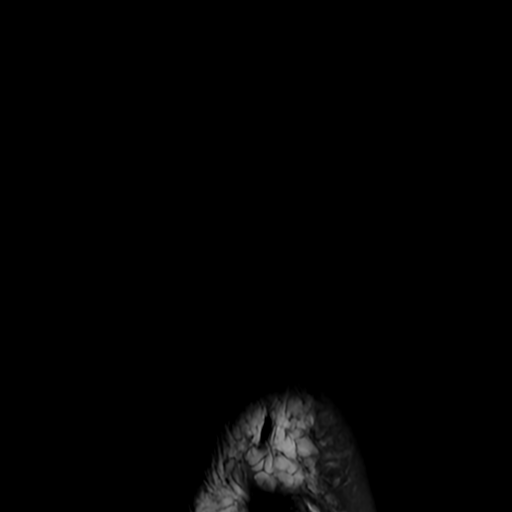

[19 of 40 positions shown; findings below may reference images not displayed]

FINDINGS: Bones/Joint/Cartilage

Bone marrow edema of the distal and middle phalanges of the fourth
digit. Subchondral cystic changes at the first metatarsophalangeal
and first interphalangeal joint. Status post amputation through the
fifth distal metatarsal.

Ligaments

Collateral ligaments are intact.  Lisfranc ligament is intact.

Muscles and Tendons

Visualized flexor and extensor tendons are intact. Increased
intramuscular signal of the plantar muscles concerning for diabetic
myopathy/myositis.

Soft tissues

Subcutaneous soft tissue edema about the dorsum and lateral aspect
of the foot. No drainable fluid collection or abscess.
IMPRESSION: 1. Bone marrow edema of the middle and distal phalanges of the
fourth digit, which in the presence of adjacent skin wound is
concerning for osteomyelitis.

2. Degenerative changes of the first metatarsophalangeal and
interphalangeal joints.

3. Increased intramuscular signal of the plantar muscles concerning
for diabetic myopathy/myositis. No drainable fluid collection or
abscess.

## 2021-08-07 MED ORDER — VANCOMYCIN HCL 10 G IV SOLR
2500.0000 mg | Freq: Once | INTRAVENOUS | Status: DC
  Filled 2021-08-07: qty 25

## 2021-08-07 MED ORDER — ENOXAPARIN SODIUM 40 MG/0.4ML IJ SOSY
40.0000 mg | PREFILLED_SYRINGE | INTRAMUSCULAR | Status: DC
  Administered 2021-08-07 – 2021-08-08 (×2): 40 mg via SUBCUTANEOUS
  Filled 2021-08-07 (×2): qty 0.4

## 2021-08-07 MED ORDER — GABAPENTIN 600 MG PO TABS
600.0000 mg | ORAL_TABLET | Freq: Three times a day (TID) | ORAL | Status: DC
  Administered 2021-08-07 – 2021-08-09 (×5): 600 mg via ORAL
  Filled 2021-08-07 (×6): qty 1

## 2021-08-07 MED ORDER — OXYCODONE-ACETAMINOPHEN 5-325 MG PO TABS
1.0000 | ORAL_TABLET | Freq: Once | ORAL | Status: AC
  Administered 2021-08-07: 1 via ORAL
  Filled 2021-08-07: qty 1

## 2021-08-07 MED ORDER — GABAPENTIN 800 MG PO TABS
800.0000 mg | ORAL_TABLET | Freq: Three times a day (TID) | ORAL | Status: DC
  Filled 2021-08-07 (×2): qty 1

## 2021-08-07 MED ORDER — VANCOMYCIN HCL 10 G IV SOLR
2250.0000 mg | Freq: Once | INTRAVENOUS | Status: AC
  Administered 2021-08-07: 2250 mg via INTRAVENOUS
  Filled 2021-08-07: qty 22.5

## 2021-08-07 MED ORDER — OXYCODONE-ACETAMINOPHEN 5-325 MG PO TABS
1.0000 | ORAL_TABLET | Freq: Four times a day (QID) | ORAL | Status: DC | PRN
  Administered 2021-08-08 – 2021-08-09 (×4): 1 via ORAL
  Filled 2021-08-07 (×4): qty 1

## 2021-08-07 MED ORDER — INSULIN ASPART 100 UNIT/ML IJ SOLN
0.0000 [IU] | Freq: Three times a day (TID) | INTRAMUSCULAR | Status: DC
  Administered 2021-08-08: 5 [IU] via SUBCUTANEOUS
  Administered 2021-08-08: 3 [IU] via SUBCUTANEOUS
  Administered 2021-08-09: 09:00:00 2 [IU] via SUBCUTANEOUS
  Administered 2021-08-09: 12:00:00 5 [IU] via SUBCUTANEOUS

## 2021-08-07 MED ORDER — INSULIN ASPART 100 UNIT/ML IJ SOLN: 0.0000 [IU] | Freq: Every day | INTRAMUSCULAR | Status: DC

## 2021-08-07 NOTE — H&P (Addendum)
Date: 08/07/2021               Patient Name:  Maria Morris MRN: [id number]  DOB: [birth year] Age / Sex: 40 y.o., female   PCP: Juanda Chance         Medical Service: Internal Medicine Teaching Service         Attending Physician: Dr. Aldine Contes    First Contact: Dr. Corky Sox Pager: [phone]  Second Contact: Dr. Virl Axe Pager: [phone]       After Hours (After 5p/  First Contact Pager: [phone]  weekends / holidays): Second Contact Pager: [phone]   Chief Complaint: pain in the fourth toe of the left foot  History of Present Illness:  Maria Morris is a 40 year old female with a PMHx of T2DM, generalized anxiety disorder, and depression, presenting with pain in the fourth toe of the left foot.   The patient began experiencing redness and swelling of the fourth toe of the left foot approximately 1 month ago. States that she saw podiatry a couple of times with the most recent visit being on 12/8 where an MRI was ordered for further evaluation. However, she did not receive a call about when and thus did not follow up. Subsequently, had worsening swelling and pain, prompting her to come to the ED. Of note, she has a previous amputation of the fifth digit of the left foot. She denies systemic symptoms such as fever/chills, nausea/vomiting.   Upon chart review, it appears that the ulceration of her left fourth toe was initially noticed by podiatry on 06/18/2021 by Dr. Cannon Kettle with multiple subsequent visits focused on medical management of patient's wound.  She reports not being able to afford her diabetes medications due to cost and lack of income to pay for them. Per chart review, diabetic medications included glipizide 36m BID, glargine 50u qhs, metformin 5875IEBID, and trulicity 311mweekly. She reports that she has not taken these medications in the past 6 months or so.    Meds:  No current facility-administered medications on file prior to encounter.    Current Outpatient Medications on File Prior to Encounter  Medication Sig Dispense Refill   acetaminophen (TYLENOL) 500 MG tablet Take 1,000-2,000 mg by mouth 3 (three) times daily as needed for headache (migraine).      aspirin-acetaminophen-caffeine (EXCEDRIN MIGRAINE) 250-250-65 MG tablet Take 2 tablets by mouth every 6 (six) hours as needed for headache or migraine.     gabapentin (NEURONTIN) 800 MG tablet Take 1 tablet (800 mg total) by mouth in the morning, at noon, and at bedtime. 90 tablet 2   amoxicillin-clavulanate (AUGMENTIN) 875-125 MG tablet Take 1 tablet by mouth 2 (two) times daily. (Patient not taking: Reported on 08/07/2021) 28 tablet 0   Blood Glucose Monitoring Suppl (TRUE METRIX METER) w/Device KIT 1 each by Does not apply route 2 (two) times a day. 1 kit 0   buPROPion (WELLBUTRIN XL) 150 MG 24 hr tablet Take 150 mg by mouth daily.  (Patient not taking: Reported on 08/07/2021)     Continuous Blood Gluc Receiver (DEXCOM G6 RECEIVER) DEVI      Continuous Blood Gluc Sensor (DEXCOM G6 SENSOR) MISC Inject 1 Device into the skin every 14 (fourteen) days.  (Patient not taking: Reported on 08/07/2021)     Continuous Blood Gluc Transmit (DEXCOM G6 TRANSMITTER) MISC See admin instructions.     glipiZIDE (GLUCOTROL) 10 MG tablet Take 1 tablet (10 mg  total) by mouth 2 (two) times daily before a meal. (Patient not taking: Reported on 08/07/2021) 60 tablet 3   glucose blood (TRUE METRIX BLOOD GLUCOSE TEST) test strip Use as instructed (Patient taking differently: 1 each by Other route as directed.) 100 each 12   ibuprofen (ADVIL) 800 MG tablet TAKE 1 TABLET(800 MG) BY MOUTH EVERY 8 HOURS AS NEEDED (Patient not taking: Reported on 08/07/2021) 30 tablet 0   insulin glargine (LANTUS SOLOSTAR) 100 UNIT/ML Solostar Pen Inject 50 Units into the skin daily. (Patient not taking: Reported on 08/07/2021) 15 mL 2   Insulin Pen Needle (TRUEPLUS PEN NEEDLES) 32G X 4 MM MISC Use as instructed. (Patient  taking differently: 1 each by Other route as directed.) 100 each 6   metFORMIN (GLUCOPHAGE) 500 MG tablet Take 1 tablet (500 mg total) by mouth 2 (two) times daily with a meal. (Patient not taking: Reported on 08/07/2021) 60 tablet 2   neomycin-polymyxin-hydrocortisone (CORTISPORIN) OTIC solution Apply 2-3 drops to right big toe once a day after shower (Patient not taking: Reported on 08/07/2021) 10 mL 0   ondansetron (ZOFRAN-ODT) 8 MG disintegrating tablet Take by mouth. (Patient not taking: Reported on 08/07/2021)     pantoprazole (PROTONIX) 40 MG tablet Take 1 tablet (40 mg total) by mouth daily. (Patient not taking: Reported on 08/07/2021) 30 tablet 2   sulfamethoxazole-trimethoprim (BACTRIM) 400-80 MG tablet Take 1 tablet by mouth 2 (two) times daily. (Patient not taking: Reported on 08/07/2021) 28 tablet 0   TRUEplus Lancets 28G MISC 1 each by Does not apply route 2 (two) times a day. 100 each 1   [DISCONTINUED] sodium chloride (OCEAN) 0.65 % SOLN nasal spray Place 1 spray into both nostrils as needed for congestion. (Patient not taking: Reported on 03/13/2019) 1 Bottle 0     Allergies: Allergies as of 08/07/2021   (No Known Allergies)   Past Medical History:  Diagnosis Date   Anemia    Anxiety    Diabetic neuropathy (HCC)    Gallstones    GERD (gastroesophageal reflux disease)    Hypertension    Migraines    Peripheral neuropathy    Tachycardia    Type 2 diabetes mellitus ([place]) 2014    Family History:  Father: T2DM, COPD Mother: cancer, unspecified Brothers (3): no health problems  Social History:  Rents a house and lives with her husband. Independent for ADLs. Works at [employer].  She reports drinking 4 beers per week. She denies smoking tobacco. She uses marijuana 3x/week.   Review of Systems: A complete ROS was negative except as per HPI.   Physical Exam: Blood pressure 140/79, pulse 82, temperature 98.1 F (36.7 C), temperature source Oral, resp.  rate 18, last menstrual period 07/12/2021, SpO2 99 %. Physical Exam Vitals reviewed.  HENT:     Head: Normocephalic and atraumatic.  Cardiovascular:     Rate and Rhythm: Normal rate and regular rhythm.     Pulses: Normal pulses.     Heart sounds: No murmur heard. Pulmonary:     Effort: Pulmonary effort is normal.     Breath sounds: Normal breath sounds.  Abdominal:     General: Bowel sounds are normal.     Palpations: Abdomen is soft.     Tenderness: There is no abdominal tenderness.  Musculoskeletal:     Comments: LLE extremity with an area of erythema and suppuration on the fourth digit of the left foot.  Strong DP pulse Intact sensation, subjectively diminished  1+  pitting edema to the mid-shins   Skin:    General: Skin is warm and dry.  Neurological:     Mental Status: She is alert and oriented to person, place, and time.  Psychiatric:        Behavior: Behavior normal.        Thought Content: Thought content normal.     EKG: personally reviewed my interpretation is not obtained  CXR: personally reviewed my interpretation is not obtained  Assessment & Plan by Problem: Maria Morris is a 40 year old female with a PMHx of T2DM, generalized anxiety disorder, and depression, presenting with pain in the fourth toe of the left foot, imaging findings consistent with osteomyelitis.   #Likely osteomyelitis of the fourth toe of the left foot MRI showing bone marrow edema of the middle and distal phalanges, concerning for osteomyelitis. Osteomyelitis likely in the setting of uncontrolled T2DM. The patient lacks constitutional symptoms (no fever/chills, n/v) and there is no leukocytosis. Podiatry consulted, will assess patient tonight. Received a dose of vancomycin in the ED. Will avoid further antibiotics in anticipation for surgical cultures, consider restarting if develops systemic signs. -Podiatry consult, appreciate recs -NPO _0  in case of surgery tomorrow -holding antibiotics  as patient does not report any systemic signs -percocet 5-37m q6 prn for pain control -CBC, BMP tomorrow  #T2DM A1C of 6.5 from June of 2022, indicating good glycemic control when patient is taking their medications. -f/u A1C, protein/Cr ratio -Moderate SSI with QHS correction -trend CBGs -Continue home gabapentin 600 mg TID -Discuss options for obtaining metformin and insulin  #GAD #Depression Chronic and stable. Was previously on wellbutrin, but this was stopped due to financial difficulties. Appropriate mood and affect on exam today.  FEN: NPO pending podiatry recs VTE: Lovenox Code status: FULL Dispo: home   Dispo: Admit patient to Inpatient with expected length of stay greater than 2 midnights.   Signed: NCorky Sox MD PGY-1 Pager: [phone] 5pm on weekdays and 1pm on weekends: On Call pager: [phone]

## 2021-08-07 NOTE — Hospital Course (Addendum)
Osteo of left 4th toe  Hx of diabetes, without meds for about 6 months due to financial difficulty.  Wound appeared 1 month ago, followed by podiatry. Last seen on 12/8. Became more red.  Spoke with orthopedics, will not be able to perform amputation until Wednesday. Recommended discharge with Augmentin.    __________________________________

## 2021-08-07 NOTE — Plan of Care (Signed)
  Problem: Education: Goal: Knowledge of General Education information will improve Description: Including pain rating scale, medication(s)/side effects and non-pharmacologic comfort measures Outcome: Progressing   Problem: Pain Managment: Goal: General experience of comfort will improve Outcome: Progressing   Problem: Skin Integrity: Goal: Risk for impaired skin integrity will decrease Outcome: Progressing   

## 2021-08-07 NOTE — ED Triage Notes (Signed)
2 week h/o left 5th toe pain and swelling. Pt c/o of toe pain radiating to her left ankle. Walking aggravates sxs. No meds taken. No falls or injuries. H/o DM type 2.

## 2021-08-07 NOTE — ED Provider Notes (Signed)
[facility]    CSN: [id number] Arrival date & time: 08/07/21  0801      History   Chief Complaint Chief Complaint  Patient presents with   Toe Pain    Left 5th    HPI Maria Morris is a 40 y.o. female.   Patient presents with skin ulcer to left fourth toe that has been present for 2 to 3 weeks.  Patient reports that she has noticed increased swelling and pain along with purulent drainage over the past few days.  She saw podiatry on 07/30/2021 for same left fourth toe ulcer.  An MRI was ordered to rule out osteomyelitis, but patient reports that this has not been completed yet.  She does have a history of diabetes, so she is followed closely by podiatry due to this foot ulcer.  Denies any numbness or tingling.   Toe Pain   Past Medical History:  Diagnosis Date   Anemia    Anxiety    Diabetic neuropathy (HCC)    Gallstones    GERD (gastroesophageal reflux disease)    Hypertension    Migraines    Peripheral neuropathy    Tachycardia    Type 2 diabetes mellitus ([place]) 2014    Patient Active Problem List   Diagnosis Date Noted   Sepsis ([place]) 03/07/2020   AKI (acute kidney injury) ([place]) 03/07/2020   DM II (diabetes mellitus, type II), controlled ([place]) 03/06/2020   Osteomyelitis ([place]) 03/05/2020   Elevated C-reactive protein (CRP) 01/29/2020   Elevated sed rate 01/29/2020   Gastroesophageal reflux disease 01/29/2020   Myalgia 01/29/2020   Seasonal allergic rhinitis due to pollen 01/29/2020   Photosensitivity dermatitis 12/26/2019   Hypertension associated with diabetes ([place]) 11/27/2019   Amenorrhea 11/14/2019   Class 3 severe obesity due to excess calories with serious comorbidity and body mass index (BMI) of 50.0 to 59.9 in adult ([place]) 11/14/2019   GAD (generalized anxiety disorder) 11/14/2019   Moderate episode of recurrent major depressive disorder ([place]) 11/14/2019   Palpitations 11/14/2019   Polyarthralgia 11/14/2019   Well woman exam with  routine gynecological exam 07/26/2019   Breast pain, left 07/26/2019    Past Surgical History:  Procedure Laterality Date   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 Authorizing Provider  acetaminophen (TYLENOL) 500 MG tablet Take 1,000-2,000 mg by mouth 3 (three) times daily as needed for headache (migraine).     [provider]  amoxicillin-clavulanate (AUGMENTIN) 875-125 MG tablet Take 1 tablet by mouth 2 (two) times daily. 12/18/20   Landis Martins, DPM  aspirin-acetaminophen-caffeine (EXCEDRIN MIGRAINE) [phone] MG tablet Take 2 tablets by mouth every 6 (  six) hours as needed for headache or migraine.    [provider]  betamethasone acetate-betamethasone sodium phosphate (CELESTONE) 6 (3-3) MG/ML injection  01/26/21   [provider]  Blood Glucose Monitoring Suppl (TRUE METRIX METER) w/Device KIT 1 each by Does not apply route 2 (two) times a day. 03/22/19   Argentina Donovan, PA-C  buPROPion (WELLBUTRIN XL) 150 MG 24 hr tablet Take 150 mg by mouth daily.  02/25/20   [provider]  Continuous Blood Gluc Receiver ([place]) [employer]  09/10/20   [provider]  Continuous Blood Gluc Sensor (DEXCOM G6 SENSOR) MISC Inject 1 Device into the skin every 14 (fourteen) days.  03/04/20   [provider]  Continuous Blood Gluc Transmit (DEXCOM G6 TRANSMITTER) MISC  See admin instructions. 03/11/20   [provider]  cyclobenzaprine (FLEXERIL) 10 MG tablet Take 10 mg by mouth 2 (two) times daily as needed for muscle spasms.    [provider]  diclofenac Sodium (VOLTAREN) 1 % GEL SMARTSIG:2 Gram(s) Topical 4 Times Daily PRN 07/15/20   [provider]  DULoxetine (CYMBALTA) 30 MG capsule Take 30 mg by mouth daily. 12/22/20   [provider]  DULoxetine (CYMBALTA) 60 MG capsule Take 60 mg by mouth daily. 06/12/21   [provider]  ferrous gluconate (FERGON) 324 MG tablet Take 324 mg by mouth daily with breakfast.    [provider]  fexofenadine (ALLEGRA) 180 MG tablet Take 180 mg by mouth daily.    [provider]  fluticasone (FLONASE) 50 MCG/ACT nasal spray SMARTSIG:1 Spray(s) Both Nares Daily PRN 04/19/20   [provider]  gabapentin (NEURONTIN) 300 MG capsule Take 300 mg by mouth 3 (three) times daily. 07/12/20   [provider]  gabapentin (NEURONTIN) 400 MG capsule Take 400 mg by mouth 3 (three) times daily. 08/21/20   [provider]  gabapentin (NEURONTIN) 600 MG tablet Take 600 mg by mouth 3 (three) times daily. 12/15/20   [provider]  gabapentin (NEURONTIN) 800 MG tablet Take by mouth. 12/24/20   [provider]  gabapentin (NEURONTIN) 800 MG tablet Take 1 tablet (800 mg total) by mouth in the morning, at noon, and at bedtime. 07/30/21   Landis Martins, DPM  glipiZIDE (GLUCOTROL) 10 MG tablet Take 1 tablet (10 mg total) by mouth 2 (two) times daily before a meal. 07/27/19   Fulp, Cammie, MD  glucose blood (TRUE METRIX BLOOD GLUCOSE TEST) test strip Use as instructed Patient taking differently: 1 each by Other route as directed. 03/22/19   Argentina Donovan, PA-C  hydrOXYzine (ATARAX/VISTARIL) 10 MG tablet Take by mouth. 03/11/20   [provider]  ibuprofen (ADVIL) 800 MG tablet TAKE 1 TABLET(800 MG) BY MOUTH EVERY 8 HOURS AS NEEDED 08/31/20    Landis Martins, DPM  indomethacin (INDOCIN) 50 MG capsule Take 50 mg by mouth 2 (two) times daily with a meal.    [provider]  insulin glargine (LANTUS SOLOSTAR) 100 UNIT/ML Solostar Pen Inject 50 Units into the skin daily. Patient taking differently: Inject 50 Units into the skin at bedtime. 10/31/19   Fulp, Cammie, MD  Insulin Pen Needle (TRUEPLUS PEN NEEDLES) 32G X 4 MM MISC Use as instructed. Patient taking differently: 1 each by Other route as directed. 10/10/19   Fulp, Cammie, MD  lisinopril (ZESTRIL) 20 MG tablet Take 20 mg by mouth daily. 02/18/20   [provider]  meloxicam (MOBIC) 15 MG tablet Take 15 mg  by mouth daily.    [provider]  metFORMIN (GLUCOPHAGE) 500 MG tablet Take 1 tablet (500 mg total) by mouth 2 (two) times daily with a meal. 07/27/19   Fulp, Cammie, MD  montelukast (SINGULAIR) 10 MG tablet Take 10 mg by mouth daily. 05/10/20   [provider]  neomycin-polymyxin-hydrocortisone (CORTISPORIN) OTIC solution Apply 2-3 drops to right big toe once a day after shower 12/18/20   Landis Martins, DPM  ondansetron (ZOFRAN-ODT) 8 MG disintegrating tablet Take by mouth. 06/10/20   [provider]  pantoprazole (PROTONIX) 40 MG tablet Take 1 tablet (40 mg total) by mouth daily. Patient taking differently: Take 40 mg by mouth daily before breakfast. 07/27/19   Fulp, Cammie, MD  rizatriptan (MAXALT) 10 MG tablet Take by mouth. 09/05/20   [provider]  sulfamethoxazole-trimethoprim (BACTRIM) 400-80 MG tablet Take 1 tablet by mouth 2 (two) times daily. 06/18/21   Landis Martins, DPM  topiramate (TOPAMAX) 100 MG tablet  08/07/20   [provider]  topiramate (TOPAMAX) 25 MG tablet Take 25 mg by mouth at bedtime.  02/05/20   [provider]  triamcinolone (KENALOG) 0.1 % Apply topically 2 (two) times daily. 09/05/20   [provider]  TRUEplus Lancets 28G MISC 1 each by Does not apply route 2 (two) times a  day. 03/22/19   Argentina Donovan, PA-C  TRULICITY 3 ON/6.2XB SOPN SMARTSIG:3 Milligram(s) SUB-Q Once a Week 12/24/20   [provider]  Vitamin D, Ergocalciferol, (DRISDOL) 1.25 MG (50000 UNIT) CAPS capsule Take 50,000 Units by mouth every Monday.  01/30/20   [provider]  sodium chloride (OCEAN) 0.65 % SOLN nasal spray Place 1 spray into both nostrils as needed for congestion. Patient not taking: Reported on 03/13/2019 07/24/18 03/13/19  Arville Lime, PA-C    Family History Family History  Problem Relation Age of Onset   Diabetes Mother    Diabetes Father    Hypertension Father    Heart disease Father    Breast cancer Paternal Aunt    Cancer Neg Hx     Social History Social History   Tobacco Use   Smoking status: Former    Types: Cigarettes    Quit date: 02/2019    Years since quitting: 2.4   Smokeless tobacco: Never  Vaping Use   Vaping Use: Never used  Substance Use Topics   Alcohol use: Yes    Comment: occ   Drug use: No     Allergies   Patient has no known allergies.   Review of Systems Review of Systems Per HPI  Physical Exam Triage Vital Signs ED Triage Vitals  Enc Vitals Group     BP 08/07/21 0811 131/83     Pulse Rate 08/07/21 0811 84     Resp 08/07/21 0811 18     Temp 08/07/21 0811 98 F (36.7 C)     Temp Source 08/07/21 0811 Oral     SpO2 08/07/21 0811 97 %     Weight --      Height --      Head Circumference --      Peak Flow --      Pain Score 08/07/21 0813 10     Pain Loc --      Pain Edu? --      Excl. in [place]? --    No data found.  Updated Vital Signs BP 131/83 (BP Location: Left Arm)    Pulse 84  Temp 98 F (36.7 C) (Oral)    Resp 18    SpO2 97%   Visual Acuity Right Eye Distance:   Left Eye Distance:   Bilateral Distance:    Right Eye Near:   Left Eye Near:    Bilateral Near:     Physical Exam Constitutional:      General: She is not in acute distress.    Appearance: Normal appearance. She is not  toxic-appearing or diaphoretic.  HENT:     Head: Normocephalic and atraumatic.  Eyes:     Extraocular Movements: Extraocular movements intact.     Conjunctiva/sclera: Conjunctivae normal.  Pulmonary:     Effort: Pulmonary effort is normal.  Musculoskeletal:       Feet:  Feet:     Left foot:     Skin integrity: Ulcer, erythema and warmth present.     Comments: Patient has approximately 1 cm in diameter ulcer to dorsal surface of left fourth toe with surrounding erythema.  No purulent drainage noted.  Moderate swelling also noted to toe.  Neurovascular intact. Neurological:     General: No focal deficit present.     Mental Status: She is alert and oriented to person, place, and time. Mental status is at baseline.  Psychiatric:        Mood and Affect: Mood normal.        Behavior: Behavior normal.        Thought Content: Thought content normal.        Judgment: Judgment normal.     UC Treatments / Results  Labs (all labs ordered are listed, but only abnormal results are displayed) Labs Reviewed - No data to display  EKG   Radiology No results found.  Procedures Procedures (including critical care time)  Medications Ordered in UC Medications - No data to display  Initial Impression / Assessment and Plan / UC Course  I have reviewed the triage vital signs and the nursing notes.  Pertinent labs & imaging results that were available during my care of the patient were reviewed by me and considered in my medical decision making (see chart for details).     The skin ulcer of the left fourth toe appears to have infection starting. Per podiatry note, an MRI was ordered to rule out osteomyelitis but has not been completed per patient.  I do think that this needs to be done sooner rather than later given that toe is worsening per patient.  Unable to order MRI in urgent care.  Advised patient to go to the hospital for further evaluation and management.  Patient was agreeable with  plan.  Vital signs stable at discharge.  Agree with patient's family member transporting her to the hospital. Final Clinical Impressions(s) / UC Diagnoses   Final diagnoses:  Skin ulcer of fourth toe of left foot, unspecified ulcer stage [facility])     Discharge Instructions      Please go to the hospital as soon as you leave urgent care for further evaluation management of skin ulcer of left toe.  You may need further imaging with an MRI.    ED Prescriptions   None    PDMP not reviewed this encounter.   Teodora Medici, [place] 08/07/21 [phone]

## 2021-08-07 NOTE — ED Provider Notes (Signed)
[facility] EMERGENCY DEPARTMENT Provider Note   CSN: [id number] Arrival date & time: 08/07/21  6812     History Chief Complaint  Patient presents with   Wound Check    Maria Morris is a 40 y.o. female with PMHx HTN, GERD, Diabetes, and peripheral neuropathy who presents to the ED today from [facility] for wound check.  Reports that she has had an ulcer to the dorsal aspect of her left fourth toe for approximately 1 month and was seen by podiatry on 12/08 for same.  They were planning to have an MRI done with concern for possible osteomyelitis however she never heard back from them.  She reports that over the past 1 to 2 weeks she has noticed clear drainage from the wound and feels like the wound is getting more swollen.  She went to urgent care today however they prompted her to come to the ED for further evaluation.  Patient denies any fevers at home.  She does report she has been without her metformin for insulin for approximately 6 months due to cost.   The history is provided by the patient and medical records.      Past Medical History:  Diagnosis Date   Anemia    Anxiety    Diabetic neuropathy (HCC)    Gallstones    GERD (gastroesophageal reflux disease)    Hypertension    Migraines    Peripheral neuropathy    Tachycardia    Type 2 diabetes mellitus ([place]) 2014    Patient Active Problem List   Diagnosis Date Noted   Osteomyelitis of fourth toe of left foot ([place]) 08/07/2021   Sepsis ([place]) 03/07/2020   AKI (acute kidney injury) ([place]) 03/07/2020   DM II (diabetes mellitus, type II), controlled ([place]) 03/06/2020   Osteomyelitis ([place]) 03/05/2020   Elevated C-reactive protein (CRP) 01/29/2020   Elevated sed rate 01/29/2020   Gastroesophageal reflux disease 01/29/2020   Myalgia 01/29/2020   Seasonal allergic rhinitis due to pollen 01/29/2020   Photosensitivity dermatitis 12/26/2019   Hypertension associated with diabetes ([place]) 11/27/2019   Amenorrhea  11/14/2019   Class 3 severe obesity due to excess calories with serious comorbidity and body mass index (BMI) of 50.0 to 59.9 in adult ([place]) 11/14/2019   GAD (generalized anxiety disorder) 11/14/2019   Moderate episode of recurrent major depressive disorder ([place]) 11/14/2019   Palpitations 11/14/2019   Polyarthralgia 11/14/2019   Well woman exam with routine gynecological exam 07/26/2019   Breast pain, left 07/26/2019    Past Surgical History:  Procedure Laterality Date   AMPUTATION TOE Left 03/08/2020   Procedure: AMPUTATION 5th TOE;  Surgeon: Landis Martins, DPM;  Location: [facility];  Service: Podiatry;  Laterality: Left;   CHOLECYSTECTOMY N/A 10/16/2019   Procedure: LAPAROSCOPIC CHOLECYSTECTOMY;  Surgeon: Mickeal Skinner, MD;  Location: [facility];  Service: General;  Laterality: N/A;   NO PAST SURGERIES       OB History     Gravida  0   Para  0   Term  0   Preterm  0   AB  0   Living  0      SAB  0   IAB  0   Ectopic  0   Multiple  0   Live Births  0           Family History  Problem Relation Age of Onset   Diabetes Mother    Diabetes Father    Hypertension Father  Heart disease Father    Breast cancer Paternal Aunt    Cancer Neg Hx     Social History   Tobacco Use   Smoking status: Former    Types: Cigarettes    Quit date: 02/2019    Years since quitting: 2.4   Smokeless tobacco: Never  Vaping Use   Vaping Use: Never used  Substance Use Topics   Alcohol use: Yes    Comment: occ   Drug use: No    Home Medications Prior to Admission medications   Medication Sig Start Date End Date Taking? Authorizing Provider  acetaminophen (TYLENOL) 500 MG tablet Take 1,000-2,000 mg by mouth 3 (three) times daily as needed for headache (migraine).    Yes [provider]  aspirin-acetaminophen-caffeine (EXCEDRIN MIGRAINE) [phone] MG tablet Take 2 tablets by mouth every 6 (six) hours as needed for headache or migraine.   Yes [provider]  gabapentin (NEURONTIN) 800 MG tablet Take 1 tablet (800 mg total) by mouth in the morning, at noon, and at bedtime. 07/30/21  Yes Stover, [place], DPM  amoxicillin-clavulanate (AUGMENTIN) 875-125 MG tablet Take 1 tablet by mouth 2 (two) times daily. Patient not taking: Reported on 08/07/2021 12/18/20   Landis Martins, DPM  Blood Glucose Monitoring Suppl (TRUE METRIX METER) w/Device KIT 1 each by Does not apply route 2 (two) times a day. 03/22/19   Argentina Donovan, PA-C  buPROPion (WELLBUTRIN XL) 150 MG 24 hr tablet Take 150 mg by mouth daily.  Patient not taking: Reported on 08/07/2021 02/25/20   [provider]  Continuous Blood Gluc Receiver ([place]) [place]  09/10/20   [provider]  Continuous Blood Gluc Sensor (DEXCOM G6 SENSOR) MISC Inject 1 Device into the skin every 14 (fourteen) days.  Patient not taking: Reported on 08/07/2021 03/04/20   [provider]  Continuous Blood Gluc Transmit (DEXCOM G6 TRANSMITTER) MISC See admin instructions. 03/11/20   [provider]  glipiZIDE (GLUCOTROL) 10 MG tablet Take 1 tablet (10 mg total) by mouth 2 (two) times daily before a meal. Patient not taking: Reported on 08/07/2021 07/27/19   Fulp, Cammie, MD  glucose blood (TRUE METRIX BLOOD GLUCOSE TEST) test strip Use as instructed Patient taking differently: 1 each by Other route as directed. 03/22/19   Argentina Donovan, PA-C  ibuprofen (ADVIL) 800 MG tablet TAKE 1 TABLET(800 MG) BY MOUTH EVERY 8 HOURS AS NEEDED Patient not taking: Reported on 08/07/2021 08/31/20   Landis Martins, DPM  insulin glargine (LANTUS SOLOSTAR) 100 UNIT/ML Solostar Pen Inject 50 Units into the skin daily. Patient not taking: Reported on 08/07/2021 10/31/19   Fulp, Cammie, MD  Insulin Pen Needle (TRUEPLUS PEN NEEDLES) 32G X 4 MM MISC Use as instructed. Patient taking differently: 1 each by Other route as directed. 10/10/19   Fulp, Cammie, MD  metFORMIN (GLUCOPHAGE) 500 MG  tablet Take 1 tablet (500 mg total) by mouth 2 (two) times daily with a meal. Patient not taking: Reported on 08/07/2021 07/27/19   Antony Blackbird, MD  neomycin-polymyxin-hydrocortisone (CORTISPORIN) OTIC solution Apply 2-3 drops to right big toe once a day after shower Patient not taking: Reported on 08/07/2021 12/18/20   Landis Martins, DPM  ondansetron (ZOFRAN-ODT) 8 MG disintegrating tablet Take by mouth. Patient not taking: Reported on 08/07/2021 06/10/20   [provider]  pantoprazole (PROTONIX) 40 MG tablet Take 1 tablet (40 mg total) by mouth daily. Patient not taking: Reported on 08/07/2021 07/27/19   Antony Blackbird, MD  sulfamethoxazole-trimethoprim (BACTRIM) 400-80 MG tablet Take 1 tablet by mouth 2 (two) times daily. Patient not taking: Reported on 08/07/2021 06/18/21   Landis Martins, DPM  TRUEplus Lancets 28G MISC 1 each by Does not apply route 2 (two) times a day. 03/22/19   Argentina Donovan, PA-C  sodium chloride (OCEAN) 0.65 % SOLN nasal spray Place 1 spray into both nostrils as needed for congestion. Patient not taking: Reported on 03/13/2019 07/24/18 03/13/19  Arville Lime, PA-C    Allergies    Patient has no known allergies.  Review of Systems   Review of Systems  Constitutional:  Negative for chills and fever.  Musculoskeletal:  Positive for arthralgias and joint swelling.  Skin:  Positive for color change and wound.  All other systems reviewed and are negative.  Physical Exam Updated Vital Signs BP 140/79 (BP Location: Right Arm)    Pulse 82    Temp 98.1 F (36.7 C) (Oral)    Resp 18    LMP 07/12/2021 (Exact Date)    SpO2 99%   Physical Exam Vitals and nursing note reviewed.  Constitutional:      Appearance: She is obese. She is not ill-appearing or diaphoretic.  HENT:     Head: Normocephalic and atraumatic.  Eyes:     Conjunctiva/sclera: Conjunctivae normal.  Cardiovascular:     Rate and Rhythm: Normal rate and regular rhythm.     Pulses: Normal  pulses.  Pulmonary:     Effort: Pulmonary effort is normal.     Breath sounds: Normal breath sounds. No wheezing, rhonchi or rales.  Abdominal:     Palpations: Abdomen is soft.     Tenderness: There is no abdominal tenderness.  Musculoskeletal:     Cervical back: Neck supple.     Comments: See photo below. 1 x 1 cm diabetic ulcer to dorsal aspect of L 4th toe without active drainage appreciated. Small amount of swelling surrounding ulcer. No obvious erythema. 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 2+ DP Pulse.   Skin:    General: Skin is warm and dry.  Neurological:     Mental Status: She is alert.     ED Results / Procedures / Treatments   Labs (all labs ordered are listed, but only abnormal results are displayed) Labs Reviewed  COMPREHENSIVE METABOLIC PANEL - Abnormal; Notable for the following components:      Result Value   Glucose, Bld 195 (*)    Albumin 3.3 (*)    All other components within normal limits  URINALYSIS, ROUTINE W REFLEX MICROSCOPIC - Abnormal; Notable for the following components:   Glucose, UA 250 (*)    All other components within normal limits  SEDIMENTATION RATE - Abnormal; Notable for the following components:   Sed Rate 45 (*)    All other components within normal limits  C-REACTIVE PROTEIN - Abnormal; Notable for the following components:   CRP 2.4 (*)    All other components within normal limits  CBG MONITORING, ED - Abnormal; Notable for the following components:   Glucose-Capillary 164 (*)    All other components within normal limits  RESP PANEL BY RT-PCR (FLU A&B, COVID) [id number]  LACTIC ACID, PLASMA  LACTIC ACID, PLASMA  CBC WITH DIFFERENTIAL/PLATELET  PROTEIN / CREATININE RATIO, URINE  I-STAT BETA HCG BLOOD, ED ([facility], [facility], [facility] ONLY)    EKG None  Radiology MR TOES LEFT WO CONTRAST  Result Date: 08/07/2021 CLINICAL DATA:  Fourth toe skin ulcer for 2-3 weeks, osteomyelitis suspected. EXAM:  MRI OF THE LEFT TOES WITHOUT CONTRAST TECHNIQUE:  Multiplanar, multisequence MR imaging of the left forefoot was performed. No intravenous contrast was administered. COMPARISON:  Radiographs dated August 07, 2021 FINDINGS: Bones/Joint/Cartilage Bone marrow edema of the distal and middle phalanges of the fourth digit. Subchondral cystic changes at the first metatarsophalangeal and first interphalangeal joint. Status post amputation through the fifth distal metatarsal. Ligaments Collateral ligaments are intact.  Lisfranc ligament is intact. Muscles and Tendons Visualized flexor and extensor tendons are intact. 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 Soft tissues Subcutaneous soft tissue edema about the dorsum and lateral aspect of the foot. No drainable fluid collection or abscess. IMPRESSION: 1. Bone marrow edema of the middle and distal phalanges of the fourth digit, which in the presence of adjacent skin wound is concerning for osteomyelitis. 2. Degenerative changes of the first metatarsophalangeal and interphalangeal joints. 3. 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 No drainable fluid collection or abscess. Electronically Signed   By: Keane Police D.O.   On: 08/07/2021 14:42   DG Foot Complete Left  Result Date: 08/07/2021 CLINICAL DATA:  Left foot wound. EXAM: LEFT FOOT - COMPLETE 3+ VIEW COMPARISON:  June 18, 2021. FINDINGS: Status post surgical amputation of distal fifth metatarsal and phalanges. There appears to be a soft tissue wound involving the fourth toe overlying the fourth proximal interphalangeal joint without definite evidence of underlying lytic destruction to suggest osteomyelitis. IMPRESSION: Postsurgical changes as described above. Soft tissue wound or ulceration involving fourth toe without definite evidence of underlying osteomyelitis. Electronically Signed   By: Marijo Conception M.D.   On: 08/07/2021 09:38     Procedures Procedures   Medications Ordered in ED Medications  vancomycin (VANCOCIN) 2,250 mg in sodium chloride 0.9 % 500 mL IVPB (has no administration in time range)  enoxaparin (LOVENOX) injection 40 mg (has no administration in time range)  oxyCODONE-acetaminophen (PERCOCET/ROXICET) 5-325 MG per tablet 1 tablet (has no administration in time range)  oxyCODONE-acetaminophen (PERCOCET/ROXICET) 5-325 MG per tablet 1 tablet (1 tablet Oral Given 08/07/21 1122)    ED Course  I have reviewed the triage vital signs and the nursing notes.  Pertinent labs & imaging results that were available during my care of the patient were reviewed by me and considered in my medical decision making (see chart for details).    MDM Rules/Calculators/A&P                          40 year old female with a history of diabetes, currently without metformin or other diabetic medications due to cost who presents to the ED today from urgent care for further evaluation of diabetic ulcer to left fourth toe dorsal aspect for the past month, worsening in the past 1 to 2 weeks.  Followed by podiatry.  On arrival to the ED today vitals are stable.  Patient appears to be no acute distress.  Labs and x-ray were ordered in triage.  CBG has returned at 164.  Xray without obvious signs of osteomyelitis however we will proceed with MRI at this time due to worsening condition of wound itself.  XR: IMPRESSION:  Postsurgical changes as described above. Soft tissue wound or  ulceration involving fourth toe without definite evidence of  underlying osteomyelitis.   Will proceed with MRI  CBC without leukocytosis. Hgb stable at 12.0 CMP with glucose 195. No other electrolyte abnormalities.  Lactic acid 1.6 CRP  elevated at 2.4 Sed rate elevated at 45  MRI: IMPRESSION:  1. Bone marrow edema of the middle and distal phalanges of the  fourth digit, which in the presence of adjacent skin wound is  concerning for  osteomyelitis.     2. Degenerative changes of the first metatarsophalangeal and  interphalangeal joints.     3. Increased intramuscular signal of the plantar muscles concerning  for diabetic myopathy/myositis. No drainable fluid collection or  abscess.      Will consult orthopedist at this time. Will start vanc and order COVID testing.   Hilbert Odor PA-C aware of patient; pt will be unable to have amputation done until Wednesday however cannot in good conscience send patient home with diagnosis of osteo. Will consult medicine for admission. Discussed case with attending physician Dr. Matilde Sprang who agrees with plan.   Discussed case with Internal Medicine who agrees to come evaluate patient. Ortho made aware pt is being admitted - internal medicine recommends formal consult. Will also touch base with podiatry since pt has been followed by them.   3:52 PM Ortho made myself aware that pt is a [facility]. Recommends consulting them. Consult pending at this time. Pt still requires admission.   At shift change case signed out to Charmaine Downs, PA-C who will await podiatry consult.   This note was prepared using Dragon voice recognition software and may include unintentional dictation errors due to the inherent limitations of voice recognition software.     Final Clinical Impression(s) / ED Diagnoses Final diagnoses:  Osteomyelitis of left foot, unspecified type [facility])    Rx / DC Orders ED Discharge Orders     None        Eustaquio Maize, PA-C 08/07/21 1616    Kommor, [place], MD 08/11/21 [phone]

## 2021-08-07 NOTE — ED Triage Notes (Signed)
Patient sent to [facility] from urgent care for further evaluation of wound on left foot, history of diabetes, wound first noticed one month ago. Patient states she has been followed by her podiatrist and was told she would be called for an MRI earlier in December was never contacted. Patient alert, oriented, and in no apparent distress at this time.

## 2021-08-07 NOTE — Consult Note (Signed)
Podiatry Consult Note  To: Dr. Oswaldo Conroy Reason for consult: Left toe infection   From: Dr. Landis Martins  HPI: Maria Morris is a 40 y.o. female patient who seen at bedside for evaluation of left foot pain and swelling.  Patient is well-known to outpatient clinic and has been closely followed for left toe ulcer.  Last week in office wound was noted to be measuring about the same size so preemptively a MRI was ordered however over a span of 1 week patient has experienced increased pain and swelling in the left foot and went to urgent care and was referred to the ER for MRI.  Patient reports that she is nervous and is concerned about her foot because the swelling and pain had been getting worse.  Patient denies nausea vomiting fever chills or any other constitutional symptoms at this time.  Patient Active Problem List   Diagnosis Date Noted   Osteomyelitis of fourth toe of left foot ([place]) 08/07/2021   Sepsis ([place]) 03/07/2020   AKI (acute kidney injury) ([place]) 03/07/2020   DM II (diabetes mellitus, type II), controlled ([place]) 03/06/2020   Osteomyelitis ([place]) 03/05/2020   Elevated C-reactive protein (CRP) 01/29/2020   Elevated sed rate 01/29/2020   Gastroesophageal reflux disease 01/29/2020   Myalgia 01/29/2020   Seasonal allergic rhinitis due to pollen 01/29/2020   Photosensitivity dermatitis 12/26/2019   Hypertension associated with diabetes ([place]) 11/27/2019   Amenorrhea 11/14/2019   Class 3 severe obesity due to excess calories with serious comorbidity and body mass index (BMI) of 50.0 to 59.9 in adult ([place]) 11/14/2019   GAD (generalized anxiety disorder) 11/14/2019   Moderate episode of recurrent major depressive disorder ([place]) 11/14/2019   Palpitations 11/14/2019   Polyarthralgia 11/14/2019   Well woman exam with routine gynecological exam 07/26/2019   Breast pain, left 07/26/2019    No current facility-administered medications on file prior to encounter.   Current  Outpatient Medications on File Prior to Encounter  Medication Sig Dispense Refill   acetaminophen (TYLENOL) 500 MG tablet Take 1,000-2,000 mg by mouth 3 (three) times daily as needed for headache (migraine).      aspirin-acetaminophen-caffeine (EXCEDRIN MIGRAINE) 250-250-65 MG tablet Take 2 tablets by mouth every 6 (six) hours as needed for headache or migraine.     gabapentin (NEURONTIN) 800 MG tablet Take 1 tablet (800 mg total) by mouth in the morning, at noon, and at bedtime. 90 tablet 2   amoxicillin-clavulanate (AUGMENTIN) 875-125 MG tablet Take 1 tablet by mouth 2 (two) times daily. (Patient not taking: Reported on 08/07/2021) 28 tablet 0   Blood Glucose Monitoring Suppl (TRUE METRIX METER) w/Device KIT 1 each by Does not apply route 2 (two) times a day. 1 kit 0   buPROPion (WELLBUTRIN XL) 150 MG 24 hr tablet Take 150 mg by mouth daily.  (Patient not taking: Reported on 08/07/2021)     Continuous Blood Gluc Receiver (DEXCOM G6 RECEIVER) DEVI      Continuous Blood Gluc Sensor (DEXCOM G6 SENSOR) MISC Inject 1 Device into the skin every 14 (fourteen) days.  (Patient not taking: Reported on 08/07/2021)     Continuous Blood Gluc Transmit (DEXCOM G6 TRANSMITTER) MISC See admin instructions.     glipiZIDE (GLUCOTROL) 10 MG tablet Take 1 tablet (10 mg total) by mouth 2 (two) times daily before a meal. (Patient not taking: Reported on 08/07/2021) 60 tablet 3   glucose blood (TRUE METRIX BLOOD GLUCOSE TEST) test strip Use as instructed (Patient taking differently: 1  each by Other route as directed.) 100 each 12   ibuprofen (ADVIL) 800 MG tablet TAKE 1 TABLET(800 MG) BY MOUTH EVERY 8 HOURS AS NEEDED (Patient not taking: Reported on 08/07/2021) 30 tablet 0   insulin glargine (LANTUS SOLOSTAR) 100 UNIT/ML Solostar Pen Inject 50 Units into the skin daily. (Patient not taking: Reported on 08/07/2021) 15 mL 2   Insulin Pen Needle (TRUEPLUS PEN NEEDLES) 32G X 4 MM MISC Use as instructed. (Patient taking  differently: 1 each by Other route as directed.) 100 each 6   metFORMIN (GLUCOPHAGE) 500 MG tablet Take 1 tablet (500 mg total) by mouth 2 (two) times daily with a meal. (Patient not taking: Reported on 08/07/2021) 60 tablet 2   neomycin-polymyxin-hydrocortisone (CORTISPORIN) OTIC solution Apply 2-3 drops to right big toe once a day after shower (Patient not taking: Reported on 08/07/2021) 10 mL 0   ondansetron (ZOFRAN-ODT) 8 MG disintegrating tablet Take by mouth. (Patient not taking: Reported on 08/07/2021)     pantoprazole (PROTONIX) 40 MG tablet Take 1 tablet (40 mg total) by mouth daily. (Patient not taking: Reported on 08/07/2021) 30 tablet 2   sulfamethoxazole-trimethoprim (BACTRIM) 400-80 MG tablet Take 1 tablet by mouth 2 (two) times daily. (Patient not taking: Reported on 08/07/2021) 28 tablet 0   TRUEplus Lancets 28G MISC 1 each by Does not apply route 2 (two) times a day. 100 each 1   [DISCONTINUED] sodium chloride (OCEAN) 0.65 % SOLN nasal spray Place 1 spray into both nostrils as needed for congestion. (Patient not taking: Reported on 03/13/2019) 1 Bottle 0    No Known Allergies  Past Surgical History:  Procedure Laterality Date   AMPUTATION TOE Left 03/08/2020   Procedure: AMPUTATION 5th TOE;  Surgeon: Landis Martins, DPM;  Location: [facility];  Service: Podiatry;  Laterality: Left;   CHOLECYSTECTOMY N/A 10/16/2019   Procedure: LAPAROSCOPIC CHOLECYSTECTOMY;  Surgeon: Kinsinger, Arta Bruce, MD;  Location: [facility];  Service: General;  Laterality: N/A;   NO PAST SURGERIES      Family History  Problem Relation Age of Onset   Diabetes Mother    Diabetes Father    Hypertension Father    Heart disease Father    Breast cancer Paternal Aunt    Cancer Neg Hx     Social History   Socioeconomic History   Marital status: Married    Spouse name: Not on file   Number of children: Not on file   Years of education: Not on file   Highest education level: 12th grade  Occupational History    Not on file  Tobacco Use   Smoking status: Former    Types: Cigarettes    Quit date: 02/2019    Years since quitting: 2.4   Smokeless tobacco: Never  Vaping Use   Vaping Use: Never used  Substance and Sexual Activity   Alcohol use: Yes    Comment: occ   Drug use: No   Sexual activity: Yes    Birth control/protection: None  Other Topics Concern   Not on file  Social History Narrative   Not on file   Social Determinants of Health   Financial Resource Strain: Not on file  Food Insecurity: Not on file  Transportation Needs: Not on file  Physical Activity: Not on file  Stress: Not on file  Social Connections: Not on file  Intimate Partner Violence: Not on file     Objective:  Today's Vitals   08/07/21 1448 08/07/21 1931 08/07/21 2042 08/07/21 2053  BP: 140/79 136/79  127/77  Pulse: 82 80  81  Resp: _0 Temp: 98.1 F (36.7 C) 98.2 F (36.8 C) 98.6 F (37 C) (!) 97.5 F (36.4 C)  TempSrc: Oral  Oral Oral  SpO2: 99% 98%  100%  Weight:   134.9 kg   Height:   5' 7" (1.702 m)   PainSc:  3      Body mass index is 46.58 kg/m.   General: Alert and oriented x3 in no acute distress  Dermatology: Left fourth toe full-thickness ulceration noted to the interphalangeal joint of the dorsal aspect of the fourth toe.  There is significant edema very minimal drainage, localized erythema.  No other open lesions noted.  Vascular: Dorsalis Pedis and Posterior Tibial pedal pulses palpable, Capillary Fill Time 3 seconds,(+) pedal hair growth bilateral, no edema bilateral lower extremities, Temperature gradient within normal limits.  Neurology: Johney Maine sensation intact via light touch bilateral, Protective sensation severely diminished bilateral.  Musculoskeletal: Mild tenderness with palpation left fourth toe.  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   Results Xrays  Left foot IMPRESSION: Postsurgical changes as described above. Soft  tissue wound or ulceration involving fourth toe without definite evidence of underlying osteomyelitis.  MRI left IMPRESSION: 1. Bone marrow edema of the middle and distal phalanges of the fourth digit, which in the presence of adjacent skin wound is concerning for osteomyelitis.   2. Degenerative changes of the first metatarsophalangeal and interphalangeal joints.   3. 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 No drainable fluid collection or abscess.      Results for orders placed or performed during the hospital encounter of 08/07/21  Resp Panel by RT-PCR (Flu A&B, Covid) Nasopharyngeal Swab     Status: None   Collection Time: 08/07/21  3:01 PM   Specimen: Nasopharyngeal Swab; Nasopharyngeal(NP) swabs in vial transport medium  Result Value Ref Range Status   SARS Coronavirus 2 by RT PCR NEGATIVE NEGATIVE Final    Comment: (NOTE) SARS-CoV-2 target nucleic acids are NOT DETECTED.  The SARS-CoV-2 RNA is generally detectable in upper respiratory specimens during the acute phase of infection. The lowest concentration of SARS-CoV-2 viral copies this assay can detect is 138 copies/mL. A negative result does not preclude SARS-Cov-2 infection and should not be used as the sole basis for treatment or other patient management decisions. A negative result may occur with  improper specimen collection/handling, submission of specimen other than nasopharyngeal swab, presence of viral mutation(s) within the areas targeted by this assay, and inadequate number of viral copies(<138 copies/mL). A negative result must be combined with clinical observations, patient history, and epidemiological information. The expected result is Negative.  Fact Sheet for Patients:  EntrepreneurPulse.com.au  Fact Sheet for Healthcare Providers:  IncredibleEmployment.be  This test is no t yet approved or cleared by  the [place] FDA and  has been authorized for detection and/or diagnosis of SARS-CoV-2 by FDA under an Emergency Use Authorization (EUA). This EUA will remain  in effect (meaning this test can be used) for the duration of the COVID-19 declaration under Section 564(b)(1) of the Act, 21 U.S.C.section 360bbb-3(b)(1), unless the authorization is terminated  or revoked sooner.       Influenza A by PCR NEGATIVE NEGATIVE Final   Influenza B by PCR NEGATIVE NEGATIVE Final    Comment: (NOTE) The Xpert Xpress SARS-CoV-2/FLU/RSV plus assay is intended as an aid in the diagnosis  of influenza from Nasopharyngeal swab specimens and should not be used as a sole basis for treatment. Nasal washings and aspirates are unacceptable for Xpert Xpress SARS-CoV-2/FLU/RSV testing.  Fact Sheet for Patients: EntrepreneurPulse.com.au  Fact Sheet for Healthcare Providers: IncredibleEmployment.be  This test is not yet approved or cleared by the [place] FDA and has been authorized for detection and/or diagnosis of SARS-CoV-2 by FDA under an Emergency Use Authorization (EUA). This EUA will remain in effect (meaning this test can be used) for the duration of the COVID-19 declaration under Section 564(b)(1) of the Act, 21 U.S.C. section 360bbb-3(b)(1), unless the authorization is terminated or revoked.  Performed at [facility], [place] [address], [place], [place] [address]      Assessment and Plan: Problem List Items Addressed This Visit       Musculoskeletal and Integument   Osteomyelitis ([place]) - Primary     -Complete examination performed -Xrays reviewed -Discussed treatment options early osteomyelitis of the left fourth toe with chronic ulcer -Patient elects for surgical management. Consent obtained for left fourth toe amputation. Pre and Post op course explained. Risks, benefits, alternatives explained. No guarantees given or implied. -Patient  to be n.p.o. at midnight for the OR tomorrow -Recommend rest and elevation for pain and edema control -Recommend continue with medical management and IV antibiotics -Patient to be weightbearing as tolerated with surgical shoe  -Consult appreciated -Podiatry to follow   Dr. Landis Martins, [place] and [place] [phone] office [phone] cell  Time spent with patient for exam and coordination of care:  25  mins

## 2021-08-07 NOTE — Discharge Instructions (Signed)
Please go to the hospital as soon as you leave urgent care for further evaluation management of skin ulcer of left toe.  You may need further imaging with an MRI.

## 2021-08-07 NOTE — ED Provider Notes (Signed)
5:00 PM Spoke to Dr. Marylene Land with podiatry who will see patient tonight.     Mannie Stabile, PA-C 08/07/21 1701    Melene Plan, DO 08/07/21 1705

## 2021-08-08 ENCOUNTER — Encounter (HOSPITAL_COMMUNITY)
Admission: EM | Disposition: A | Discharge status: Home / Self Care | Payer: Self-pay | Source: Home / Self Care | Attending: Internal Medicine

## 2021-08-08 ENCOUNTER — Observation Stay (HOSPITAL_COMMUNITY): Payer: Managed Care, Other (non HMO)

## 2021-08-08 ENCOUNTER — Observation Stay (HOSPITAL_COMMUNITY): Payer: Managed Care, Other (non HMO) | Admitting: Certified Registered Nurse Anesthetist

## 2021-08-08 ENCOUNTER — Encounter (HOSPITAL_COMMUNITY): Payer: Self-pay | Admitting: Internal Medicine

## 2021-08-08 DIAGNOSIS — F419 Anxiety disorder, unspecified: Secondary | ICD-10-CM | POA: Diagnosis present

## 2021-08-08 DIAGNOSIS — Z8249 Family history of ischemic heart disease and other diseases of the circulatory system: Secondary | ICD-10-CM | POA: Diagnosis not present

## 2021-08-08 DIAGNOSIS — Z6841 Body Mass Index (BMI) 40.0 and over, adult: Secondary | ICD-10-CM | POA: Diagnosis not present

## 2021-08-08 DIAGNOSIS — Z91120 Patient's intentional underdosing of medication regimen due to financial hardship: Secondary | ICD-10-CM | POA: Diagnosis not present

## 2021-08-08 DIAGNOSIS — E1169 Type 2 diabetes mellitus with other specified complication: Secondary | ICD-10-CM | POA: Diagnosis present

## 2021-08-08 DIAGNOSIS — L97529 Non-pressure chronic ulcer of other part of left foot with unspecified severity: Secondary | ICD-10-CM | POA: Diagnosis present

## 2021-08-08 DIAGNOSIS — F411 Generalized anxiety disorder: Secondary | ICD-10-CM | POA: Diagnosis present

## 2021-08-08 DIAGNOSIS — G629 Polyneuropathy, unspecified: Secondary | ICD-10-CM | POA: Diagnosis present

## 2021-08-08 DIAGNOSIS — I10 Essential (primary) hypertension: Secondary | ICD-10-CM | POA: Diagnosis present

## 2021-08-08 DIAGNOSIS — Z20822 Contact with and (suspected) exposure to covid-19: Secondary | ICD-10-CM | POA: Diagnosis present

## 2021-08-08 DIAGNOSIS — Z803 Family history of malignant neoplasm of breast: Secondary | ICD-10-CM | POA: Diagnosis not present

## 2021-08-08 DIAGNOSIS — E11621 Type 2 diabetes mellitus with foot ulcer: Secondary | ICD-10-CM | POA: Diagnosis present

## 2021-08-08 DIAGNOSIS — Z87891 Personal history of nicotine dependence: Secondary | ICD-10-CM | POA: Diagnosis not present

## 2021-08-08 DIAGNOSIS — M869 Osteomyelitis, unspecified: Secondary | ICD-10-CM | POA: Diagnosis present

## 2021-08-08 DIAGNOSIS — K219 Gastro-esophageal reflux disease without esophagitis: Secondary | ICD-10-CM | POA: Diagnosis present

## 2021-08-08 DIAGNOSIS — Y92009 Unspecified place in unspecified non-institutional (private) residence as the place of occurrence of the external cause: Secondary | ICD-10-CM | POA: Diagnosis not present

## 2021-08-08 DIAGNOSIS — Z825 Family history of asthma and other chronic lower respiratory diseases: Secondary | ICD-10-CM | POA: Diagnosis not present

## 2021-08-08 DIAGNOSIS — F32A Depression, unspecified: Secondary | ICD-10-CM | POA: Diagnosis present

## 2021-08-08 DIAGNOSIS — Z833 Family history of diabetes mellitus: Secondary | ICD-10-CM | POA: Diagnosis not present

## 2021-08-08 DIAGNOSIS — E1142 Type 2 diabetes mellitus with diabetic polyneuropathy: Secondary | ICD-10-CM | POA: Diagnosis present

## 2021-08-08 DIAGNOSIS — T383X6A Underdosing of insulin and oral hypoglycemic [antidiabetic] drugs, initial encounter: Secondary | ICD-10-CM | POA: Diagnosis present

## 2021-08-08 DIAGNOSIS — Z79899 Other long term (current) drug therapy: Secondary | ICD-10-CM | POA: Diagnosis not present

## 2021-08-08 HISTORY — PX: AMPUTATION TOE: SHX6595

## 2021-08-08 LAB — BASIC METABOLIC PANEL
Anion gap: 10 (ref 5–15)
BUN: 11 mg/dL (ref 6–20)
CO2: 25 mmol/L (ref 22–32)
Calcium: 8.6 mg/dL — ABNORMAL LOW (ref 8.9–10.3)
Chloride: 99 mmol/L (ref 98–111)
Creatinine, Ser: 0.66 mg/dL (ref 0.44–1.00)
GFR, Estimated: >60 mL/min (ref >60)
Glucose, Bld: 169 mg/dL — ABNORMAL HIGH (ref 70–99)
Potassium: 4 mmol/L (ref 3.5–5.1)
Sodium: 134 mmol/L — ABNORMAL LOW (ref 135–145)

## 2021-08-08 LAB — CBC
HCT: 36.8 % (ref 36.0–46.0)
Hemoglobin: 11.6 g/dL — ABNORMAL LOW (ref 12.0–15.0)
MCH: 26.6 pg (ref 26.0–34.0)
MCHC: 31.5 g/dL (ref 30.0–36.0)
MCV: 84.4 fL (ref 80.0–100.0)
Platelets: 239 10*3/uL (ref 150–400)
RBC: 4.36 MIL/uL (ref 3.87–5.11)
RDW: 15.3 % (ref 11.5–15.5)
WBC: 10.3 10*3/uL (ref 4.0–10.5)
nRBC: 0 % (ref 0.0–0.2)

## 2021-08-08 LAB — GLUCOSE, CAPILLARY
Glucose-Capillary: 188 mg/dL — ABNORMAL HIGH (ref 70–99)
Glucose-Capillary: 158 mg/dL — ABNORMAL HIGH (ref 70–99)
Glucose-Capillary: 118 mg/dL — ABNORMAL HIGH (ref 70–99)
Glucose-Capillary: 110 mg/dL — ABNORMAL HIGH (ref 70–99)
Glucose-Capillary: 224 mg/dL — ABNORMAL HIGH (ref 70–99)
Glucose-Capillary: 149 mg/dL — ABNORMAL HIGH (ref 70–99)

## 2021-08-08 LAB — HIV ANTIBODY (ROUTINE TESTING W REFLEX): HIV Screen 4th Generation wRfx: NONREACTIVE

## 2021-08-08 LAB — SURGICAL PCR SCREEN
MRSA, PCR: NEGATIVE
Staphylococcus aureus: NEGATIVE

## 2021-08-08 LAB — HEMOGLOBIN A1C
Hgb A1c MFr Bld: 7.1 % — ABNORMAL HIGH (ref 4.8–5.6)
Mean Plasma Glucose: 157.07 mg/dL

## 2021-08-08 IMAGING — DX DG FOOT 2V*L*
2 series · 2 of 2 positions shown · non-contrast
Comparison: Left foot radiographs [DATE]

CLINICAL DATA: Osteomyelitis of the fourth toe, status post
amputation

EXAM:
LEFT FOOT - 2 VIEW

[foot ap]
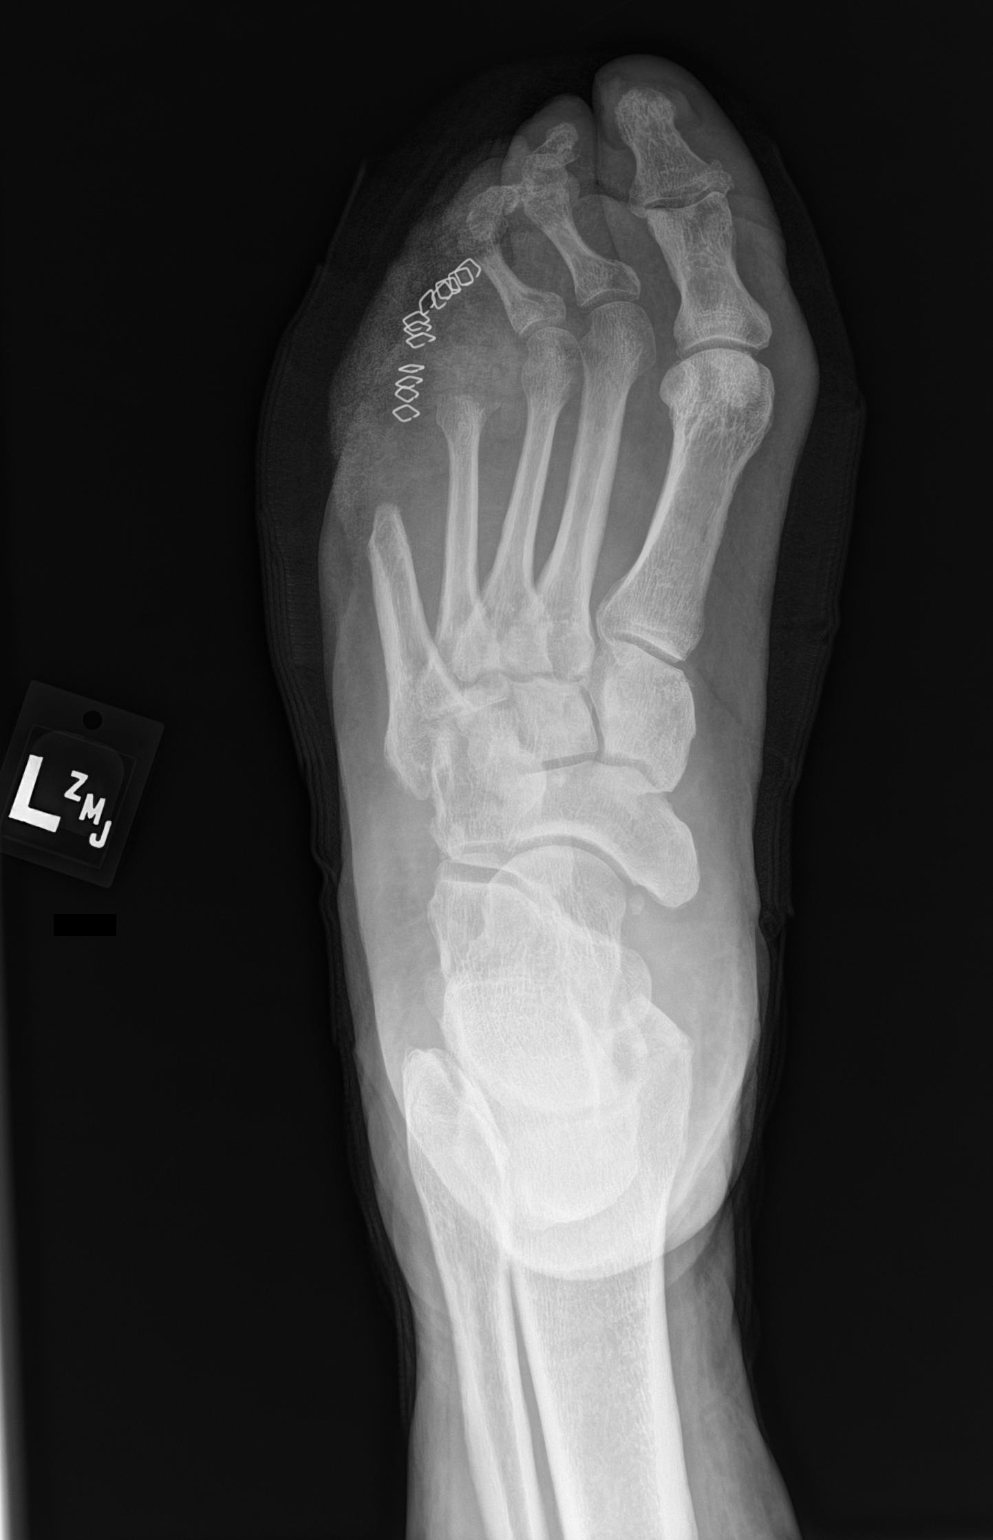

[foot lat]
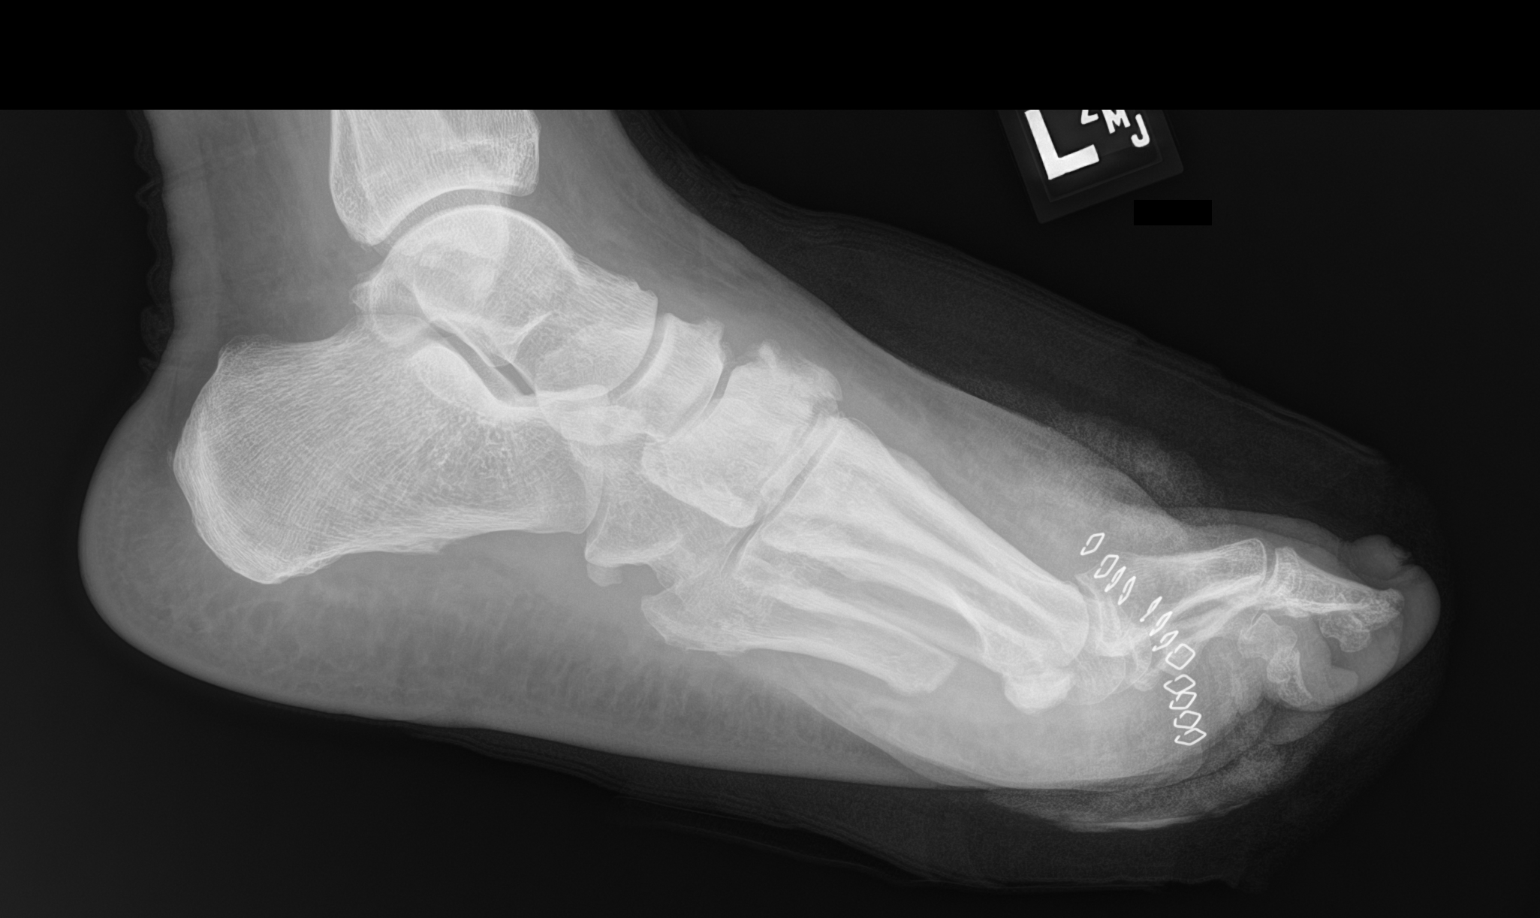

[2 of 2 positions shown; findings below may reference images not displayed]

FINDINGS: Status post amputation of the fourth toe at the level of the
metatarsal head. There are postoperative changes including staples
in the regional soft tissues. No acute osseous abnormality
identified.
IMPRESSION: Status post amputation of the fourth toe at the level of the
metatarsal head.

## 2021-08-08 SURGERY — AMPUTATION, TOE
Anesthesia: Monitor Anesthesia Care | Site: Toe | Laterality: Left

## 2021-08-08 MED ORDER — FENTANYL CITRATE (PF) 250 MCG/5ML IJ SOLN
INTRAMUSCULAR | Status: AC
  Filled 2021-08-08: qty 5

## 2021-08-08 MED ORDER — FENTANYL CITRATE (PF) 250 MCG/5ML IJ SOLN
INTRAMUSCULAR | Status: DC | PRN
  Administered 2021-08-08 (×2): 25 ug via INTRAVENOUS

## 2021-08-08 MED ORDER — VANCOMYCIN HCL 1750 MG/350ML IV SOLN
1750.0000 mg | Freq: Two times a day (BID) | INTRAVENOUS | Status: DC
  Administered 2021-08-08 – 2021-08-09 (×2): 1750 mg via INTRAVENOUS
  Filled 2021-08-08 (×2): qty 350

## 2021-08-08 MED ORDER — ONDANSETRON HCL 4 MG/2ML IJ SOLN
INTRAMUSCULAR | Status: AC
  Filled 2021-08-08: qty 2

## 2021-08-08 MED ORDER — CHLORHEXIDINE GLUCONATE 0.12 % MT SOLN
15.0000 mL | Freq: Once | OROMUCOSAL | Status: AC
  Filled 2021-08-08: qty 15

## 2021-08-08 MED ORDER — CHLORHEXIDINE GLUCONATE 0.12 % MT SOLN
OROMUCOSAL | Status: AC
  Administered 2021-08-08: 15 mL via OROMUCOSAL
  Filled 2021-08-08: qty 15

## 2021-08-08 MED ORDER — ARTIFICIAL TEARS OPHTHALMIC OINT
TOPICAL_OINTMENT | OPHTHALMIC | Status: AC
  Filled 2021-08-08: qty 3.5

## 2021-08-08 MED ORDER — PROPOFOL 500 MG/50ML IV EMUL
INTRAVENOUS | Status: DC | PRN
  Administered 2021-08-08: 100 ug/kg/min via INTRAVENOUS

## 2021-08-08 MED ORDER — ONDANSETRON HCL 4 MG/2ML IJ SOLN: 4.0000 mg | Freq: Four times a day (QID) | INTRAMUSCULAR | Status: DC | PRN

## 2021-08-08 MED ORDER — OXYCODONE HCL 5 MG/5ML PO SOLN: 5.0000 mg | Freq: Once | ORAL | Status: DC | PRN

## 2021-08-08 MED ORDER — OXYCODONE HCL 5 MG PO TABS: 5.0000 mg | ORAL_TABLET | Freq: Once | ORAL | Status: DC | PRN

## 2021-08-08 MED ORDER — LIDOCAINE 2% (20 MG/ML) 5 ML SYRINGE
INTRAMUSCULAR | Status: DC | PRN
  Administered 2021-08-08: 40 mg via INTRAVENOUS

## 2021-08-08 MED ORDER — PROPOFOL 10 MG/ML IV BOLUS
INTRAVENOUS | Status: AC
  Filled 2021-08-08: qty 20

## 2021-08-08 MED ORDER — MIDAZOLAM HCL 2 MG/2ML IJ SOLN
INTRAMUSCULAR | Status: AC
  Filled 2021-08-08: qty 2

## 2021-08-08 MED ORDER — BUPIVACAINE HCL (PF) 0.25 % IJ SOLN
INTRAMUSCULAR | Status: AC
  Filled 2021-08-08: qty 30

## 2021-08-08 MED ORDER — LIDOCAINE HCL 1 % IJ SOLN
INTRAMUSCULAR | Status: AC
  Filled 2021-08-08: qty 20

## 2021-08-08 MED ORDER — ONDANSETRON HCL 4 MG/2ML IJ SOLN
INTRAMUSCULAR | Status: DC | PRN
  Administered 2021-08-08: 4 mg via INTRAVENOUS

## 2021-08-08 MED ORDER — LACTATED RINGERS IV SOLN: INTRAVENOUS | Status: DC

## 2021-08-08 MED ORDER — FENTANYL CITRATE (PF) 100 MCG/2ML IJ SOLN: 25.0000 ug | INTRAMUSCULAR | Status: DC | PRN

## 2021-08-08 MED ORDER — LIDOCAINE HCL 1 % IJ SOLN
INTRAMUSCULAR | Status: DC | PRN
  Administered 2021-08-08 (×2): 10 mL via INTRAMUSCULAR

## 2021-08-08 MED ORDER — MIDAZOLAM HCL 2 MG/2ML IJ SOLN
INTRAMUSCULAR | Status: DC | PRN
  Administered 2021-08-08: 2 mg via INTRAVENOUS

## 2021-08-08 MED ORDER — KETOROLAC TROMETHAMINE 30 MG/ML IJ SOLN
30.0000 mg | Freq: Once | INTRAMUSCULAR | Status: AC
  Administered 2021-08-08: 30 mg via INTRAVENOUS
  Filled 2021-08-08: qty 1

## 2021-08-08 SURGICAL SUPPLY — 35 items
BAG COUNTER SPONGE SURGICOUNT (BAG) ×2 IMPLANT
BAG SPNG CNTER NS LX DISP (BAG) ×1
BAG SURGICOUNT SPONGE COUNTING (BAG) ×1
BLADE SAW SGTL NAR THIN XSHT (BLADE) ×3 IMPLANT
BLADE SURG 10 STRL SS (BLADE) ×3 IMPLANT
BNDG CMPR 9X4 STRL LF SNTH (GAUZE/BANDAGES/DRESSINGS) ×1
BNDG COHESIVE 4X5 TAN STRL (GAUZE/BANDAGES/DRESSINGS) ×3 IMPLANT
BNDG ELASTIC 4X5.8 VLCR STR LF (GAUZE/BANDAGES/DRESSINGS) ×3 IMPLANT
BNDG ESMARK 4X9 LF (GAUZE/BANDAGES/DRESSINGS) ×3 IMPLANT
BNDG GAUZE ELAST 4 BULKY (GAUZE/BANDAGES/DRESSINGS) ×3 IMPLANT
COVER SURGICAL LIGHT HANDLE (MISCELLANEOUS) ×3 IMPLANT
CUFF TOURN SGL QUICK 18X4 (TOURNIQUET CUFF) ×3 IMPLANT
CUFF TOURN SGL QUICK 24 (TOURNIQUET CUFF) ×3
CUFF TRNQT CYL 24X4X16.5-23 (TOURNIQUET CUFF) ×1 IMPLANT
DRSG PAD ABDOMINAL 8X10 ST (GAUZE/BANDAGES/DRESSINGS) ×3 IMPLANT
DURAPREP 26ML APPLICATOR (WOUND CARE) ×3 IMPLANT
GAUZE SPONGE 4X4 12PLY STRL LF (GAUZE/BANDAGES/DRESSINGS) ×3 IMPLANT
GLOVE SURG ENC MOIS LTX SZ6.5 (GLOVE) ×3 IMPLANT
GLOVE SURG UNDER LTX SZ6.5 (GLOVE) ×3 IMPLANT
GOWN STRL REUS W/ TWL LRG LVL3 (GOWN DISPOSABLE) ×2 IMPLANT
GOWN STRL REUS W/TWL LRG LVL3 (GOWN DISPOSABLE) ×6
KIT BASIN OR (CUSTOM PROCEDURE TRAY) ×3 IMPLANT
KIT TURNOVER KIT B (KITS) ×3 IMPLANT
NDL HYPO 25GX1X1/2 BEV (NEEDLE) ×1 IMPLANT
NEEDLE HYPO 25GX1X1/2 BEV (NEEDLE) ×3 IMPLANT
NS IRRIG 1000ML POUR BTL (IV SOLUTION) ×3 IMPLANT
PACK ORTHO EXTREMITY (CUSTOM PROCEDURE TRAY) ×3 IMPLANT
PAD ARMBOARD 7.5X6 YLW CONV (MISCELLANEOUS) ×6 IMPLANT
STAPLER VISISTAT (STAPLE) ×2 IMPLANT
SUT PROLENE 3 0 PS 2 (SUTURE) ×6 IMPLANT
SUT VIC AB 2-0 SH 27 (SUTURE) ×3
SUT VIC AB 2-0 SH 27XBRD (SUTURE) ×1 IMPLANT
SYR CONTROL 10ML LL (SYRINGE) ×3 IMPLANT
TUBE CONNECTING 12'X1/4 (SUCTIONS) ×1
TUBE CONNECTING 12X1/4 (SUCTIONS) ×2 IMPLANT

## 2021-08-08 NOTE — Progress Notes (Signed)
°  Date: 08/08/2021  Patient name: Maria Morris  Medical record number: [id number]  Date of birth: Jul 15, 1981   I have seen and evaluated Maria Morris and discussed their care with the Residency Team.  In brief: Patient is a 40 year old female with a past medical history of type 2 diabetes, generalized anxiety disorder and depression who presented to the ED with worsening pain and swelling in her left fourth toe.  Patient states that over the last couple of months she has been following with podiatry intermittently for a wound over her left fourth toe.  She last saw podiatry on December 8 and an MRI was ordered for further evaluation of that toe.  Over the last week patient has noted worsening pain and swelling in the left toe as well as purulent drainage from the toe and came to the ED for further evaluation.  No chest pain, no shortness of breath, no palpitations, no lightheadedness, no syncope, no fevers or chills, no headache, no blurry vision, no abdominal pain, no diarrhea, no nausea or vomiting.  Of note, patient has had difficulty affording her diabetes medications and has not been taking them.  Today, patient states that she feels well today and denies any new complaints.  PMHx, Fam Hx, and/or Soc Hx : As per resident admit note  Vitals:   08/08/21 0819 08/08/21 1026  BP: 128/82 132/70  Pulse: 77 73  Resp: 20 17  Temp: 97.6 F (36.4 C) 98.4 F (36.9 C)  SpO2: 99% 97%   General: Awake, alert, oriented x3, NAD CVs: Regular rate and rhythm, normal heart sounds Lungs: CTA bilaterally Abdomen: Soft, nontender, nondistended, normoactive bowel sounds Extremities: Mild localized edema noted over the left foot, status post left fifth toe amputation, nontender to palpation Psych: Normal mood and affect HEENT: Normocephalic, atraumatic Skin: Ulcer noted over left fourth toe with an area of surrounding erythema, no drainage noted  Assessment and Plan: I have seen and  evaluated the patient as outlined above. I agree with the formulated Assessment and Plan as detailed in the residents' note, with the following changes:   1.  Osteomyelitis of the left fourth toe: -Patient presented the ED with worsening pain and swelling as well as purulent drainage from the left fourth toe and was found to have osteomyelitis on imaging with possible myositis of the left foot.  Patient does have a history of diabetes and is not able to afford her current medications which likely contributed to nonhealing of the ulcer of her left fourth toe. -We will continue with IV vancomycin for now -Podiatry follow-up and recommendations appreciated.  Patient scheduled to have left fourth amputation today -Continue with pain control for now -Lactic acid was within normal limits and leukocytosis has resolved. -No further work-up at this time -I suspect patient should be stable for DC in the next day or 2  Earl Lagos, MD 12/17/202211:38 AM

## 2021-08-08 NOTE — Progress Notes (Signed)
Acknowledging TOC consult for medication assistance. Patient has insurance. No assistance available for discharge medicaitons. For specific medication concern, please submit new consult with specific details of need. Thank you.   Hortencia Conradi, RN MSN CCM Transitions of Care

## 2021-08-08 NOTE — Anesthesia Preprocedure Evaluation (Signed)
Anesthesia Evaluation  Patient identified by MRN, date of birth, ID band Patient awake    Reviewed: Allergy & Precautions, H&P , NPO status , Patient's Chart, lab work & pertinent test results  Airway Mallampati: II   Neck ROM: full    Dental   Pulmonary former smoker,    breath sounds clear to auscultation       Cardiovascular hypertension,  Rhythm:regular Rate:Normal     Neuro/Psych  Headaches, PSYCHIATRIC DISORDERS Anxiety Depression  Neuromuscular disease    GI/Hepatic GERD  ,  Endo/Other  diabetes, Type 2Morbid obesity  Renal/GU      Musculoskeletal   Abdominal   Peds  Hematology   Anesthesia Other Findings   Reproductive/Obstetrics                             Anesthesia Physical Anesthesia Plan  ASA: 3  Anesthesia Plan: MAC   Post-op Pain Management:    Induction: Intravenous  PONV Risk Score and Plan: 2 and Ondansetron, Propofol infusion, Midazolam and Treatment may vary due to age or medical condition  Airway Management Planned: Simple Face Mask  Additional Equipment:   Intra-op Plan:   Post-operative Plan:   Informed Consent: I have reviewed the patients History and Physical, chart, labs and discussed the procedure including the risks, benefits and alternatives for the proposed anesthesia with the patient or authorized representative who has indicated his/her understanding and acceptance.     Dental advisory given  Plan Discussed with: CRNA, Anesthesiologist and Surgeon  Anesthesia Plan Comments:         Anesthesia Quick Evaluation

## 2021-08-08 NOTE — Progress Notes (Signed)
Patient arrived from PACU. Aox4, VSS. OOB to bathroom. Voiding. Lunch ordered. Denies pain. Drsg intact.   Patient requesting to be discharged this afternoon. IM paged to make aware of request.  No other complaints at this time.

## 2021-08-08 NOTE — Progress Notes (Signed)
RN informed by IM resident patient will be staying tonight and possible d/c tomorrow or Monday. Patient started on IV abx.   RN informed patient of plan of care. Patient became very tearful. RN encouraged patient to have family and friends come by to see her and that the providers will be around in the morning to see her.

## 2021-08-08 NOTE — Progress Notes (Signed)
Orthopedic Tech Progress Note Patient Details:  Maria Morris 1981/05/17 [id number]   Ortho Devices Type of Ortho Device: Postop shoe/boot Ortho Device/Splint Location: LLE Ortho Device/Splint Interventions: Application, Adjustment, Ordered   Post Interventions Patient Tolerated: Well Instructions Provided: Poper ambulation with device, Care of device, Adjustment of device  Travis Purk Carmine Savoy 08/08/2021, 1:56 PM

## 2021-08-08 NOTE — Progress Notes (Signed)
Patient received from ED via wheelchair by staff. Patient alert and oriented X4. Denies any discomfort or pain upon arrival. Other than swelling to patient left 4th toe, patient skin is intact. Call button, phone, and side table within reach of patient. Bed in the lowest position.

## 2021-08-08 NOTE — Plan of Care (Signed)
  Problem: Education: Goal: Knowledge of General Education information will improve Description: Including pain rating scale, medication(s)/side effects and non-pharmacologic comfort measures Outcome: Progressing   Problem: Pain Managment: Goal: General experience of comfort will improve Outcome: Progressing   Problem: Safety: Goal: Ability to remain free from injury will improve Outcome: Progressing   

## 2021-08-08 NOTE — Progress Notes (Signed)
Mobility Specialist Progress Note    08/08/21 1801  Mobility  Activity Ambulated in hall  Level of Assistance Modified independent, requires aide device or extra time  Assistive Device  (IV pole)  Distance Ambulated (ft) 300 ft  Mobility Ambulated independently in hallway  Mobility Response Tolerated well  Mobility performed by Mobility specialist  $Mobility charge 1 Mobility   Pt received sitting EOB and agreeable. C/o 8/10 pain. Returned to sitting EOB with call bell in reach.   [facility] Mobility Specialist  M.S. Primary Phone: [phone] M.S. Secondary Phone: [phone]

## 2021-08-08 NOTE — Brief Op Note (Signed)
08/07/2021 - 08/08/2021  12:14 PM  PATIENT:  Maria Morris  40 y.o. female  PRE-OPERATIVE DIAGNOSIS:  toe ulcer with early osteomyelitis  POST-OPERATIVE DIAGNOSIS:  toe ulcer with early osteomyelitis  PROCEDURE:  Procedure(s) with comments: AMPUTATION TOE (Left) - 4th toe  SURGEON:  Surgeon(s) and Role:    Landis Martins, DPM - Primary  PHYSICIAN ASSISTANT:   ASSISTANTS: none   ANESTHESIA:   MAC  EBL:  5cc  BLOOD ADMINISTERED:none  DRAINS: none   LOCAL MEDICATIONS USED:  MARCAINE and lidocaine plain     SPECIMEN:  Source of Specimen:  left 4th toe and met head to path and deep wound culture to micro  DISPOSITION OF SPECIMEN:  PATHOLOGY  COUNTS:  YES  TOURNIQUET:   Total Tourniquet Time Documented: Calf (Left) - 23 minutes Total: Calf (Left) - 23 minutes   DICTATION: .Note written in EPIC  PLAN OF CARE: Admit to inpatient   PATIENT DISPOSITION:  PACU - hemodynamically stable.   Delay start of Pharmacological VTE agent (>24hrs) due to surgical blood loss or risk of bleeding: no

## 2021-08-08 NOTE — Op Note (Signed)
DATE: 08/08/2021  SURGEON: Landis Martins, DPM  PREOPERATIVE DIAGNOSIS: Left fourth toe ulcer with early osteomyelitis  POSTOPERATIVE DIAGNOSIS: Same   PROCEDURE PERFORMED: Left fourth toe amputation with metatarsal head resection and deep wound culture   HEMOSTASIS: Left ankle tourniquet   ESTIMATED BLOOD LOSS: 5 cc   ANESTHESIA:  MAC with local 20cc of 1:1 mixture 1% lidocaine and 0.5% marcaine plain   SPECIMENS: Bone and soft tissue- path, wound swab- micro   COMPLICATIONS:  None.   INDICATIONS FOR PROCEDURE:  This patient is a pleasant 40 y.o. diabetic female who has been seen in office for chronic wound with now concern for early osteomyelitis.  Patient elects for surgery for amputation.  Risks and complications include but are not limited to infection, recurrence of symptoms, pain, numbness, wound dehiscence, delayed healing, as well as need for future surgery/further amputation.  No guarantees were given or applied.  All questions were answered to the patients satisfaction, and the patient has consented to the above procedure.  All preoperative labs and H&P, medical clearances have been obtained and NPO status past midnight has been confirmed.    PREPARATION FOR PROCEDURE:  The patient was brought to the operating room and placed on the operating table in supine position.  A pneumatic ankle tourniquet was placed about the patient's right foot but not yet inflated.  After the department of anesthesia had administered MAC anesthesia. A local block was administered and the right foot was then scrubbed, prepped, and draped in the usual aseptic manner.  An Esmarch bandage was utilized to exsanguinate the patients right foot and leg, and the pneumatic tourniquet was inflated to 250 mmHg.  PROCEDURE IN DETAIL:  Attention was then directed to left fourth toe where a racquet-type incision was made, extending distally, looping around the toe and ulcer of the fourth toe into the  digital web space. The incision was made in the routine fashion, single layer down to the level of bone. When doing so, there was no purulence noted at the incision site. The incision was carried deep down to the level of the fourth metatarsophalangeal joint where the toe was disarticulated at that level. Once disarticulated, the fourth metatarsophalangeal joint, the toe was removed from the operative field and placed on the back stable to be sent for pathologic analysis. The fourth metatarsal head was inspected and resected at level of surgical neck and any remaining devitalizing infected tissue was debrided as necessary. The extensor and flexor tendon was dissected as proximally as visible and transected at that level.  The operative field was irrigated with normal saline solution.  A small tissue culture from the remaining tissue flaps was then taken post irrigation, to be sent for culture and sensitivity analysis. Also, bleeders were bovied as necessary. Skin closure was then obtained utilizing 2-0 vicryl and 3-0 Prolene in combination of simple interrupted suture fashion and skin staples.  The left foot was then dressed with Betadine overlying the suture sites, 4 x 4 gauze, abd, Kerlix,and ACE.  At this time, the left pneumatic tourniquet was deflated, and a positive hyperemic response was noted to the remaining. The patient tolerated the procedure and anesthesia well.  Upon transfer to the recovery room, the patients vital signs were stable, and neurovascular status was intact.    Patient to return to the medical floor for continued medical monitoring and continuation of IV antibiotics pending cultures likely discharge to home on Monday.  Landis Martins, DPM

## 2021-08-08 NOTE — Transfer of Care (Signed)
Immediate Anesthesia Transfer of Care Note  Patient: Maria Morris  Procedure(s) Performed: AMPUTATION TOE (Left: Toe)  Patient Location: PACU  Anesthesia Type:MAC  Level of Consciousness: awake, alert , oriented, patient cooperative and responds to stimulation  Airway & Oxygen Therapy: Pt Spontaneous respirations   Post-op Assessment: Report given to RN and Post -op Vital signs reviewed and stable  Post vital signs: Reviewed and stable  Last Vitals:  Vitals Value Taken Time  BP 112/52 08/08/21 1222  Temp    Pulse 89 08/08/21 1222  Resp 22 08/08/21 1222  SpO2 100 % 08/08/21 1222  Vitals shown include unvalidated device data.  Last Pain:  Vitals:   08/08/21 1026  TempSrc: Oral  PainSc: 5       Patients Stated Pain Goal: 3 (02/33/43 5686)  Complications: No notable events documented.

## 2021-08-08 NOTE — Progress Notes (Signed)
Pharmacy Antibiotic Note  Maria Morris is a 40 y.o. female admitted on 08/07/2021 with osteomyelitis of the left fourth toe with surrounding erythema and no drainage noted. Patient has not been compliant with her diabetes medications.  Patient underwent left fourth toe amputation and metatarsal head resection on 12/17. Pharmacy has been consulted for Vancomycin dosing pending surgical cultures.   Of note, patient received a dose of Vancomycin (2250 mg in ED 16:24 08/07/21) and then further doses were held to yield surgical cultures in light of no systemic signs of infection.   Plan: Vancomycin 1,750 mg Q12H  F/U de-escalation, renal function, levels at steady state, clinical improvement   Height: 5\' 7"  (170.2 cm) Weight: 134.9 kg (297 lb 6.4 oz) IBW/kg (Calculated) : 61.6  Temp (24hrs), Avg:98.1 F (36.7 C), Min:97.5 F (36.4 C), Max:98.6 F (37 C)  Recent Labs  Lab 08/07/21 0912 08/07/21 1107 08/08/21 0440  WBC 9.8  --  10.3  CREATININE 0.79  --  0.66  LATICACIDVEN 1.5 1.6  --     Estimated Creatinine Clearance: 136.8 mL/min (by C-G formula based on SCr of 0.66 mg/dL).    No Known Allergies  Antimicrobials this admission: Vancomycin 12/16>> (see above)   Microbiology results: 12/17 Surgical Cx: ngtd   1/18, PharmD PGY-1 Acute Care Resident  08/08/2021 1:41 PM

## 2021-08-08 NOTE — Progress Notes (Signed)
° °  Subjective:   No acute overnight events.  Doing well this morning. Pain is well managed with percocet. She has had an amputation of her left 5th digit in the past and thus is familiar with the current management plan.  States that she used to follow PA Ralene Ok but has not seen her recently. Discussed importance of close follow up going forward in order to obtain outpatient medications regularly.  Objective:  Vital signs in last 24 hours: Vitals:   08/07/21 2042 08/07/21 2053 08/08/21 0819 08/08/21 1026  BP:  127/77 128/82 132/70  Pulse:  81 77 73  Resp:  18 20 17   Temp: 98.6 F (37 C) (!) 97.5 F (36.4 C) 97.6 F (36.4 C) 98.4 F (36.9 C)  TempSrc: Oral Oral  Oral  SpO2:  100% 99% 97%  Weight: 134.9 kg     Height: 5\' 7"  (1.702 m)      Physical Exam: General: young obese female, sitting up in chair, NAD. CV: normal rate and regular rhythm, no m/r/g. Pulm: CTABL, no adventitious sounds noted. Abdomen: soft, nondistended, nontender, normoactive bowel sounds. MSK: left 5th digit amputation noted. Ulceration on left 4th digit unchanged from yesterday, still no drainage noted. Diminished sensation, similar to yesterday.  Neuro: AAOx4, no focal deficits. Psych: appropriate mood and affect.  Assessment/Plan:  Principal Problem:   Osteomyelitis of fourth toe of left foot (HCC)  Osteomyelitis of left 4th toe Likely in the setting of long-standing T2DM, although A1c of 7.1% suggests that T2DM is fairly well controlled even without medication management. Dr. evaluated patient yesterday, plans for surgery today. Denies systemic symptoms, has remained afebrile, and no leukocytosis on labs.  -podiatry consulted, appreciate recs -NPO in anticipation for surgery today -percocet 5-325mg  q6 prn for pain control -IV vancomycin per podiatry  T2DM A1c 7.1%, which is fairly well controlled given nonadherence to medications over the past 6 months due to financial difficulties.  CBGs well controlled overnight. -continue moderate SSI -trend CBGs -continue home gabapentin -TOC consulted for medication assistance  Prior to Admission Living Arrangement: home Anticipated Discharge Location: home Barriers to Discharge: continued medical management Dispo: Anticipated discharge in approximately 1-2 day(s).   , MD 08/08/2021, 11:20 AM Pager: [phone] After 5pm on weekdays and 1pm on weekends: On Call pager [phone]

## 2021-08-08 NOTE — Plan of Care (Signed)

## 2021-08-09 DIAGNOSIS — M869 Osteomyelitis, unspecified: Secondary | ICD-10-CM

## 2021-08-09 LAB — GLUCOSE, CAPILLARY
Glucose-Capillary: 130 mg/dL — ABNORMAL HIGH (ref 70–99)
Glucose-Capillary: 207 mg/dL — ABNORMAL HIGH (ref 70–99)

## 2021-08-09 MED ORDER — AMOXICILLIN-POT CLAVULANATE 875-125 MG PO TABS
1.0000 | ORAL_TABLET | Freq: Two times a day (BID) | ORAL | Status: DC
  Administered 2021-08-09: 10:00:00 1 via ORAL
  Filled 2021-08-09: qty 1

## 2021-08-09 MED ORDER — AMOXICILLIN-POT CLAVULANATE 875-125 MG PO TABS: 1.0000 | ORAL_TABLET | Freq: Two times a day (BID) | ORAL | 0 refills | Status: AC

## 2021-08-09 NOTE — Plan of Care (Signed)

## 2021-08-09 NOTE — Progress Notes (Signed)
Subjective: Maria Morris is a 40 y.o. female patient seen at bedside, resting comfortably in no acute distress s/p day #1 Left fourth toe amputation to the level of the metatarsal head.  Patient denies pain at surgical site, denies calf pain, denies headache, chest pain, shortness of breath, nausea, vomitting, denies loss of appetite, denies problems with voiding.  Reports that she got up and walk with physical therapy on yesterday and did very well.  No other issues noted.   Patient Active Problem List   Diagnosis Date Noted   Osteomyelitis of fourth toe of left foot ([place]) 08/07/2021   Sepsis ([place]) 03/07/2020   AKI (acute kidney injury) ([place]) 03/07/2020   DM II (diabetes mellitus, type II), controlled ([place]) 03/06/2020   Osteomyelitis ([place]) 03/05/2020   Elevated C-reactive protein (CRP) 01/29/2020   Elevated sed rate 01/29/2020   Gastroesophageal reflux disease 01/29/2020   Myalgia 01/29/2020   Seasonal allergic rhinitis due to pollen 01/29/2020   Photosensitivity dermatitis 12/26/2019   Hypertension associated with diabetes ([place]) 11/27/2019   Amenorrhea 11/14/2019   Class 3 severe obesity due to excess calories with serious comorbidity and body mass index (BMI) of 50.0 to 59.9 in adult ([place]) 11/14/2019   GAD (generalized anxiety disorder) 11/14/2019   Moderate episode of recurrent major depressive disorder ([place]) 11/14/2019   Palpitations 11/14/2019   Polyarthralgia 11/14/2019   Well woman exam with routine gynecological exam 07/26/2019   Breast pain, left 07/26/2019     Current Facility-Administered Medications:    amoxicillin-clavulanate (AUGMENTIN) 875-125 MG per tablet 1 tablet, 1 tablet, Oral, Q12H, Corky Sox, MD, 1 tablet at 08/09/21 1014   enoxaparin (LOVENOX) injection 40 mg, 40 mg, Subcutaneous, Q24H, Allyson Sabal, Sagar, MD, 40 mg at 08/08/21 1705   gabapentin (NEURONTIN) tablet 600 mg, 600 mg, Oral, TID, Virl Axe, MD, 600 mg at 08/09/21 0912   insulin aspart  (novoLOG) injection 0-15 Units, 0-15 Units, Subcutaneous, TID WC, Corky Sox, MD, 2 Units at 08/09/21 0912   insulin aspart (novoLOG) injection 0-5 Units, 0-5 Units, Subcutaneous, QHS, Corky Sox, MD   oxyCODONE-acetaminophen (PERCOCET/ROXICET) 5-325 MG per tablet 1 tablet, 1 tablet, Oral, Q6H PRN, Virl Axe, MD, 1 tablet at 08/08/21 2229  No Known Allergies   Objective: Today's Vitals   08/08/21 2200 08/08/21 2314 08/09/21 0828 08/09/21 0912  BP:   127/80   Pulse:   80   Resp:   16   Temp:   98.3 F (36.8 C)   TempSrc:   Oral   SpO2:   100%   Weight:      Height:      PainSc: 7  3   0-No pain    General: No acute distress  Left Lower extremity: Dressing to left foot clean, dry, intact. No strikethrough noted, Upon removal of dressings sutures and staples intact with no dehiscence. Decreased erythema, decreased edema, mild blood drainage. No other acute signs of infection. No calf pain. Range of motion excluding surgical site within normal limits with no pain or crepitation.     Results for orders placed or performed during the hospital encounter of 08/07/21  Resp Panel by RT-PCR (Flu A&B, Covid) Nasopharyngeal Swab     Status: None   Collection Time: 08/07/21  3:01 PM   Specimen: Nasopharyngeal Swab; Nasopharyngeal(NP) swabs in vial transport medium  Result Value Ref Range Status   SARS Coronavirus 2 by RT PCR NEGATIVE NEGATIVE Final    Comment: (NOTE) SARS-CoV-2 target nucleic acids are NOT DETECTED.  The SARS-CoV-2 RNA is generally detectable in upper respiratory specimens during the acute phase of infection. The lowest concentration of SARS-CoV-2 viral copies this assay can detect is 138 copies/mL. A negative result does not preclude SARS-Cov-2 infection and should not be used as the sole basis for treatment or other patient management decisions. A negative result may occur with  improper specimen collection/handling, submission of specimen other than  nasopharyngeal swab, presence of viral mutation(s) within the areas targeted by this assay, and inadequate number of viral copies(<138 copies/mL). A negative result must be combined with clinical observations, patient history, and epidemiological information. The expected result is Negative.  Fact Sheet for Patients:  BloggerCourse.com  Fact Sheet for Healthcare Providers:  SeriousBroker.it  This test is no t yet approved or cleared by the [place] FDA and  has been authorized for detection and/or diagnosis of SARS-CoV-2 by FDA under an Emergency Use Authorization (EUA). This EUA will remain  in effect (meaning this test can be used) for the duration of the COVID-19 declaration under Section 564(b)(1) of the Act, 21 U.S.C.section 360bbb-3(b)(1), unless the authorization is terminated  or revoked sooner.       Influenza A by PCR NEGATIVE NEGATIVE Final   Influenza B by PCR NEGATIVE NEGATIVE Final    Comment: (NOTE) The Xpert Xpress SARS-CoV-2/FLU/RSV plus assay is intended as an aid in the diagnosis of influenza from Nasopharyngeal swab specimens and should not be used as a sole basis for treatment. Nasal washings and aspirates are unacceptable for Xpert Xpress SARS-CoV-2/FLU/RSV testing.  Fact Sheet for Patients: BloggerCourse.com  Fact Sheet for Healthcare Providers: SeriousBroker.it  This test is not yet approved or cleared by the [place] FDA and has been authorized for detection and/or diagnosis of SARS-CoV-2 by FDA under an Emergency Use Authorization (EUA). This EUA will remain in effect (meaning this test can be used) for the duration of the COVID-19 declaration under Section 564(b)(1) of the Act, 21 U.S.C. section 360bbb-3(b)(1), unless the authorization is terminated or revoked.  Performed at [facility], [address] [place], [place] [address]    Surgical pcr screen     Status: None   Collection Time: 08/07/21  9:22 PM   Specimen: Nasal Mucosa; Nasal Swab  Result Value Ref Range Status   MRSA, PCR NEGATIVE NEGATIVE Final   Staphylococcus aureus NEGATIVE NEGATIVE Final    Comment: (NOTE) The Xpert SA Assay (FDA approved for NASAL specimens in patients 56 years of age and older), is one component of a comprehensive surveillance program. It is not intended to diagnose infection nor to guide or monitor treatment. Performed at [facility], [address] [place], [place] [address]   Aerobic/Anaerobic Culture w Gram Stain (surgical/deep wound)     Status: None (Preliminary result)   Collection Time: 08/08/21 11:55 AM   Specimen: PATH Other; Tissue  Result Value Ref Range Status   Specimen Description TISSUE  Final   Special Requests   Final    DEEP WD CULTURE LEFT 4TH TOE AMPUTATION SITE SPEC A   Gram Stain   Final    RARE WBC PRESENT,BOTH PMN AND MONONUCLEAR RARE GRAM POSITIVE COCCI RARE GRAM VARIABLE ROD Performed at [facility], [address], [place], [place] [address]    Culture PENDING  Incomplete   Report Status PENDING  Incomplete     Post Op Xray, Left foot: IMPRESSION: Status post amputation of the fourth toe at the level of the metatarsal  head.  Assessment and Plan:  Problem List Items Addressed This Visit       Musculoskeletal and Integument   Osteomyelitis ([place]) - Primary   * (Principal) Osteomyelitis of fourth toe of left foot ([place])   Other Visit Diagnoses     S/P amputation of lesser toe, left ([place])       Relevant Orders   DG Foot 2 Views Left (Completed)       -Patient seen and evaluated at bedside -Dressing change performed; applied dry dressing to the left foot -Advised patient to make sure to keep dressing clean, dry, and intact to Left foot  -Intraoperative cultures pending -Weightbearing with post op shoe for bedside and bathroom only with assistive device if  needed -Continue with rest and elevation to assist with pain and edema control  -Podiatry will continue to follow closely outpatient -Anticipated discharge plan of care: To home with antibiotics (Augmentin 7 days ) we will adjust antibiotics if needed on outpatient basis once culture results are final.  Patient stable for discharge home today.  Patient after discharge to follow up in office on Thursday.  Dr. Landis Martins On Call provider  [facility] [phone] office [phone] cell Available via secure chat

## 2021-08-09 NOTE — Social Work (Signed)
°  Transition of Care [facility]) Screening Note   Patient Details  Name: Maria Morris Date of Birth: March 30, 1981   Transition of Care [facility]) CM/SW Contact:    Jimmy Picket, LCSW Phone Number: 08/09/2021, 2:04 PM    Transition of Care Department [facility]) has reviewed patient and no TOC needs have been identified at this time. We will continue to monitor patient advancement through interdisciplinary progression rounds. If new patient transition needs arise, please place a TOC consult.

## 2021-08-09 NOTE — Discharge Summary (Signed)
Name: Maria Morris MRN: [id number] DOB: 12-03-1980 40 y.o. PCP: Juanda Chance  Date of Admission: 08/07/2021  8:53 AM Date of Discharge: 08/09/2021 Attending Physician: Dr. Gilles Chiquito  Discharge Diagnosis: 1. Osteomyelitis of the fourth toe of the left foot (s/p amputation) 2. T2DM 3. Generalized anxiety disorder 4. Depression  Discharge Medications: Allergies as of 08/09/2021   No Known Allergies      Medication List     STOP taking these medications    acetaminophen 500 MG tablet Commonly known as: TYLENOL   aspirin-acetaminophen-caffeine 250-250-65 MG tablet Commonly known as: EXCEDRIN MIGRAINE   buPROPion 150 MG 24 hr tablet Commonly known as: WELLBUTRIN XL   glipiZIDE 10 MG tablet Commonly known as: GLUCOTROL   ibuprofen 800 MG tablet Commonly known as: ADVIL   Lantus SoloStar 100 UNIT/ML Solostar Pen Generic drug: insulin glargine   neomycin-polymyxin-hydrocortisone OTIC solution Commonly known as: CORTISPORIN   ondansetron 8 MG disintegrating tablet Commonly known as: ZOFRAN-ODT   pantoprazole 40 MG tablet Commonly known as: PROTONIX   sulfamethoxazole-trimethoprim 400-80 MG tablet Commonly known as: BACTRIM       TAKE these medications    amoxicillin-clavulanate 875-125 MG tablet Commonly known as: AUGMENTIN Take 1 tablet by mouth every 12 (twelve) hours for 7 days. What changed: when to take this   Dexcom G6 Receiver Devi   Dexcom G6 Sensor Misc Inject 1 Device into the skin every 14 (fourteen) days.   Dexcom G6 Transmitter Misc See admin instructions.   gabapentin 800 MG tablet Commonly known as: NEURONTIN Take 1 tablet (800 mg total) by mouth in the morning, at noon, and at bedtime.   metFORMIN 500 MG tablet Commonly known as: GLUCOPHAGE Take 1 tablet (500 mg total) by mouth 2 (two) times daily with a meal.   True Metrix Blood Glucose Test test strip Generic drug: glucose blood Use as instructed What  changed:  how much to take how to take this when to take this additional instructions   True Metrix Meter w/Device Kit 1 each by Does not apply route 2 (two) times a day.   TRUEplus Lancets 28G Misc 1 each by Does not apply route 2 (two) times a day.   TRUEplus Pen Needles 32G X 4 MM Misc Generic drug: Insulin Pen Needle Use as instructed. What changed:  how much to take how to take this when to take this additional instructions               Discharge Care Instructions  (From admission, onward)           Start     Ordered   08/09/21 0000  Discharge wound care:       Comments: Follow up with podiatry in clinic on Thursday   08/09/21 1217            Disposition and follow-up:   [name] was discharged from [facility] in Stable condition.  At the hospital follow up visit please address:  1.  Intra-operative cultures and adjusting ABX as needed. Wound care. Adherence to T2DM medications.   2.  Labs / imaging needed at time of follow-up: NA  3.  Pending labs/ test needing follow-up: intra-operative cultures  Hospital Course by problem list: 1. Osteomyelitis of the fourth toe of the left foot (s/p amputation) Patient developed the wound as shown below over the past month. On presentation she lacked any systemic symptoms (no fever/chills, nausea/vomiting, or leukocytosis). MRI showed  bone marrow edema of the middle and distal phalanges, concerning for osteomyelitis. She was taken for surgery on 08/08/2021. She tolerated the fourth ray amputation well. She was able to ambulate long distances shortly after the procedure. Her pain was well controlled. She was discharged on a 7 day course of Augmentin with intraoperative cultures pending.   2. T2DM The patient's A1C was found to be 7.1, which is surprising given her osteomyelitis. She reports medication non-compliance for the past 6 months due to an inability to pay for her  medications. Her CBGs were well controlled during admission with sliding scale insulin.   Discharge Exam:   BP 127/80 (BP Location: Left Arm)    Pulse 80    Temp 98.3 F (36.8 C) (Oral)    Resp 16    Ht _0  (1.702 m)    Wt 134.9 kg    LMP 07/12/2021 (Exact Date)    SpO2 100%    BMI 46.58 kg/m  Discharge exam:  Physical Exam Vitals reviewed.  Cardiovascular:     Rate and Rhythm: Normal rate and regular rhythm.     Heart sounds: No murmur heard. Pulmonary:     Effort: Pulmonary effort is normal.     Breath sounds: Normal breath sounds.  Abdominal:     General: Bowel sounds are normal.     Palpations: Abdomen is soft.     Tenderness: There is no abdominal tenderness.  Musculoskeletal:     Comments: L foot wrapped in dressings  Skin:    General: Skin is warm and dry.  Neurological:     Mental Status: She is alert.    Previous appearance of the fourth digit:     Pertinent Labs, Studies, and Procedures:  MRI as above, pending intra-op cultures  Discharge Instructions: Discharge Instructions     Diet - low sodium heart healthy   Complete by: As directed    Discharge wound care:   Complete by: As directed    Follow up with podiatry in clinic on Thursday   Increase activity slowly   Complete by: As directed      You were admitted for a bone infection (osteomyelitis) in your foot. This was treated with an amputation which you tolerated well. It will be important to restart your diabetic medications to prevent this from happening again. Daily foot checks will also be helpful to stop any increasing areas of inflammation/redness that could turn into a bone infection. Please follow up with Dr. Cannon Kettle as listed elsewhere and follow up with your PCP in 7 days.    Signed: Corky Sox, MD PGY-1

## 2021-08-09 NOTE — Progress Notes (Signed)
AVS d/c teaching has been completed at bedside. Pt has verbalized understanding of teaching at this time and has no further questions for RN. Pt's belongings have been returned to her and everything has been packed away in anticipation for d/c. Pt will be going home with husband. Pt's IV has been removed and IV is intact, VS are stable at this time.

## 2021-08-09 NOTE — Discharge Instructions (Addendum)
You were admitted for a bone infection (osteomyelitis) in your foot. This was treated with an amputation which you tolerated well. It will be important to restart your diabetic medications to prevent this from happening again. Daily foot checks will be helpful to catch spots of inflammation that are starting to form. Please follow up with Dr. Marylene Land as listed elsewhere and follow up with your PCP in 7 days. Please take your prescribed Augmentin twice every day for 7 days.  Carlyn Reichert, MD

## 2021-08-09 NOTE — Plan of Care (Signed)
°  Problem: Education: Goal: Knowledge of General Education information will improve Description: Including pain rating scale, medication(s)/side effects and non-pharmacologic comfort measures 08/09/2021 1236 by Marsh Dolly, RN Outcome: Adequate for Discharge 08/09/2021 0949 by Marsh Dolly, RN Outcome: Progressing   Problem: Health Behavior/Discharge Planning: Goal: Ability to manage health-related needs will improve 08/09/2021 1236 by Marsh Dolly, RN Outcome: Adequate for Discharge 08/09/2021 0949 by Marsh Dolly, RN Outcome: Progressing   Problem: Clinical Measurements: Goal: Ability to maintain clinical measurements within normal limits will improve 08/09/2021 1236 by Marsh Dolly, RN Outcome: Adequate for Discharge 08/09/2021 0949 by Marsh Dolly, RN Outcome: Progressing Goal: Will remain free from infection 08/09/2021 1236 by Marsh Dolly, RN Outcome: Adequate for Discharge 08/09/2021 0949 by Marsh Dolly, RN Outcome: Progressing Goal: Diagnostic test results will improve 08/09/2021 1236 by Marsh Dolly, RN Outcome: Adequate for Discharge 08/09/2021 0949 by Marsh Dolly, RN Outcome: Progressing Goal: Respiratory complications will improve 08/09/2021 1236 by Marsh Dolly, RN Outcome: Adequate for Discharge 08/09/2021 0949 by Marsh Dolly, RN Outcome: Progressing Goal: Cardiovascular complication will be avoided 08/09/2021 1236 by Marsh Dolly, RN Outcome: Adequate for Discharge 08/09/2021 0949 by Marsh Dolly, RN Outcome: Progressing   Problem: Activity: Goal: Risk for activity intolerance will decrease 08/09/2021 1236 by Marsh Dolly, RN Outcome: Adequate for Discharge 08/09/2021 0949 by Marsh Dolly, RN Outcome: Progressing   Problem: Nutrition: Goal: Adequate nutrition will be maintained 08/09/2021 1236 by Marsh Dolly, RN Outcome: Adequate for Discharge 08/09/2021 0949 by Marsh Dolly, RN Outcome:  Progressing   Problem: Coping: Goal: Level of anxiety will decrease 08/09/2021 1236 by Marsh Dolly, RN Outcome: Adequate for Discharge 08/09/2021 0949 by Marsh Dolly, RN Outcome: Progressing   Problem: Elimination: Goal: Will not experience complications related to bowel motility 08/09/2021 1236 by Marsh Dolly, RN Outcome: Adequate for Discharge 08/09/2021 0949 by Marsh Dolly, RN Outcome: Progressing Goal: Will not experience complications related to urinary retention 08/09/2021 1236 by Marsh Dolly, RN Outcome: Adequate for Discharge 08/09/2021 0949 by Marsh Dolly, RN Outcome: Progressing   Problem: Pain Managment: Goal: General experience of comfort will improve 08/09/2021 1236 by Marsh Dolly, RN Outcome: Adequate for Discharge 08/09/2021 0949 by Marsh Dolly, RN Outcome: Progressing   Problem: Safety: Goal: Ability to remain free from injury will improve 08/09/2021 1236 by Marsh Dolly, RN Outcome: Adequate for Discharge 08/09/2021 0949 by Marsh Dolly, RN Outcome: Progressing   Problem: Skin Integrity: Goal: Risk for impaired skin integrity will decrease 08/09/2021 1236 by Marsh Dolly, RN Outcome: Adequate for Discharge 08/09/2021 0949 by Marsh Dolly, RN Outcome: Progressing

## 2021-08-10 ENCOUNTER — Other Ambulatory Visit: Payer: Self-pay | Admitting: Sports Medicine

## 2021-08-10 ENCOUNTER — Encounter (HOSPITAL_COMMUNITY): Payer: Self-pay | Admitting: Sports Medicine

## 2021-08-10 MED ORDER — HYDROCODONE-ACETAMINOPHEN 10-325 MG PO TABS: 1.0000 | ORAL_TABLET | Freq: Four times a day (QID) | ORAL | 0 refills | Status: AC | PRN

## 2021-08-10 NOTE — Anesthesia Postprocedure Evaluation (Signed)
Anesthesia Post Note  Patient: Maria Morris  Procedure(s) Performed: AMPUTATION TOE (Left: Toe)     Patient location during evaluation: PACU Anesthesia Type: MAC Level of consciousness: awake and alert Pain management: pain level controlled Vital Signs Assessment: post-procedure vital signs reviewed and stable Respiratory status: spontaneous breathing, nonlabored ventilation, respiratory function stable and patient connected to nasal cannula oxygen Cardiovascular status: stable and blood pressure returned to baseline Postop Assessment: no apparent nausea or vomiting Anesthetic complications: no   No notable events documented.  Last Vitals:  Vitals:   08/08/21 1916 08/09/21 0828  BP: 129/82 127/80  Pulse: 87 80  Resp: 17 16  Temp: 36.6 C 36.8 C  SpO2: 97% 100%    Last Pain:  Vitals:   08/09/21 1224  TempSrc:   PainSc: [place]

## 2021-08-10 NOTE — Progress Notes (Signed)
Pain meds sent

## 2021-08-11 LAB — SURGICAL PATHOLOGY

## 2021-08-13 ENCOUNTER — Encounter: Payer: Self-pay | Admitting: Sports Medicine

## 2021-08-13 ENCOUNTER — Ambulatory Visit (INDEPENDENT_AMBULATORY_CARE_PROVIDER_SITE_OTHER): Payer: Managed Care, Other (non HMO)

## 2021-08-13 ENCOUNTER — Ambulatory Visit (INDEPENDENT_AMBULATORY_CARE_PROVIDER_SITE_OTHER): Payer: Managed Care, Other (non HMO) | Admitting: Sports Medicine

## 2021-08-13 ENCOUNTER — Other Ambulatory Visit: Payer: Self-pay

## 2021-08-13 DIAGNOSIS — Z9889 Other specified postprocedural states: Secondary | ICD-10-CM | POA: Diagnosis not present

## 2021-08-13 DIAGNOSIS — E1142 Type 2 diabetes mellitus with diabetic polyneuropathy: Secondary | ICD-10-CM

## 2021-08-13 DIAGNOSIS — M79672 Pain in left foot: Secondary | ICD-10-CM

## 2021-08-13 DIAGNOSIS — Z89422 Acquired absence of other left toe(s): Secondary | ICD-10-CM

## 2021-08-13 NOTE — Progress Notes (Signed)
Subjective: Maria Morris is a 40 y.o. female patient seen today in office for POV #1 (DOS 08-08-21) S/P Left 4th toe amputation. Patient admits a little pain at surgical site, denies calf pain, denies headache, chest pain, shortness of breath, nausea, vomiting, fever, or chills. No other issues noted.   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/26/2019   Hypertension associated with diabetes ([place]) 11/27/2019   Amenorrhea 11/14/2019   Class 3 severe obesity due to excess calories with serious comorbidity and body mass index (BMI) of 50.0 to 59.9 in adult [facility]) 11/14/2019   GAD (generalized anxiety disorder) 11/14/2019   Moderate episode of recurrent major depressive disorder ([place]) 11/14/2019   Palpitations 11/14/2019   Polyarthralgia 11/14/2019   Well woman exam with routine gynecological exam 07/26/2019   Breast pain, left 07/26/2019    Current Outpatient Medications on File Prior to Visit  Medication Sig Dispense Refill   amoxicillin-clavulanate (AUGMENTIN) 875-125 MG tablet Take 1 tablet by mouth every 12 (twelve) hours for 7 days. 14 tablet 0   Blood Glucose Monitoring Suppl (TRUE METRIX METER) w/Device KIT 1 each by Does not apply route 2 (two) times a day. 1 kit 0   Continuous Blood Gluc Receiver (DEXCOM G6 RECEIVER) DEVI      Continuous Blood Gluc Sensor (DEXCOM G6 SENSOR) MISC Inject 1 Device into the skin every 14 (fourteen) days.  (Patient not taking: Reported on 08/07/2021)     Continuous Blood Gluc  Transmit (DEXCOM G6 TRANSMITTER) MISC See admin instructions.     gabapentin (NEURONTIN) 800 MG tablet Take 1 tablet (800 mg total) by mouth in the morning, at noon, and at bedtime. 90 tablet 2   glucose blood (TRUE METRIX BLOOD GLUCOSE TEST) test strip Use as instructed (Patient taking differently: 1 each by Other route as directed.) 100 each 12   HYDROcodone-acetaminophen (NORCO) 10-325 MG tablet Take 1 tablet by mouth every 6 (six) hours as needed for up to 5 days. 20 tablet 0   Insulin Pen Needle (TRUEPLUS PEN NEEDLES) 32G X 4 MM MISC Use as instructed. (Patient taking differently: 1 each by Other route as directed.) 100 each 6   metFORMIN (GLUCOPHAGE) 500 MG tablet Take 1 tablet (500 mg total) by mouth 2 (two) times daily with a meal. (Patient not taking: Reported on 08/07/2021) 60 tablet 2   TRUEplus Lancets 28G MISC 1 each by Does not apply route 2 (two) times a day. 100 each 1   [DISCONTINUED] sodium chloride (OCEAN) 0.65 % SOLN nasal spray Place 1 spray into both nostrils as needed for congestion. (Patient not taking: Reported on 03/13/2019) 1 Bottle 0   No current facility-administered medications on file prior to visit.    No Known Allergies  Objective: There were no vitals filed for this visit.  General: No acute distress, AAOx3  Left foot Sutures and staples intact with no gapping or dehiscence at surgical site, mild swelling to left forefoot, no erythema, no warmth, no drainage, no signs of infection noted, Capillary fill time <3 seconds  in all remaining digits.  No pain with calf compression.   Post Op Xray, Left foot consistent with amputation.  Assessment and Plan:  Problem List Items Addressed This Visit   None Visit Diagnoses     Status post surgery    -  Primary   Relevant Orders   DG Foot Complete Left   History of amputation of lesser toe of left foot (HCC)       Left foot pain       Diabetic polyneuropathy associated with type 2 diabetes mellitus ([place])             -Patient seen and evaluated -Xrays removed  -Applied dry sterile dressing to surgical site left foot secured with ACE wrap and stockinet  -Advised patient to make sure to keep dressings clean, dry, and intact to left foot, adjusting the ACE as needed  -Dispensed CAM boot -Advised patient to limit activity to necessity  -Advised patient to ice and elevate as necessary  -Continue with PRN pain meds -Will plan for dressing change at next office visit. In the meantime, patient to call office if any issues or problems arise.   Landis Martins, DPM

## 2021-08-17 LAB — AEROBIC/ANAEROBIC CULTURE W GRAM STAIN (SURGICAL/DEEP WOUND)

## 2021-08-19 ENCOUNTER — Other Ambulatory Visit: Payer: Self-pay

## 2021-08-19 ENCOUNTER — Ambulatory Visit (INDEPENDENT_AMBULATORY_CARE_PROVIDER_SITE_OTHER): Payer: Managed Care, Other (non HMO)

## 2021-08-19 DIAGNOSIS — Z9889 Other specified postprocedural states: Secondary | ICD-10-CM

## 2021-08-19 NOTE — Progress Notes (Signed)
Patient came in for dressing change,  Applied dry sterile dressing on surgical foot , ACE wrap and patient brought her own sock to put on.  Patient was advised to continue to ice and elevate as needed.  Patient will follow up with Dr. Marylene Land

## 2021-08-24 ENCOUNTER — Other Ambulatory Visit: Payer: Self-pay | Admitting: Podiatry

## 2021-08-24 MED ORDER — HYDROCODONE-ACETAMINOPHEN 10-325 MG PO TABS: 1.0000 | ORAL_TABLET | Freq: Four times a day (QID) | ORAL | 0 refills | Status: AC | PRN

## 2021-08-27 ENCOUNTER — Ambulatory Visit (INDEPENDENT_AMBULATORY_CARE_PROVIDER_SITE_OTHER): Payer: Managed Care, Other (non HMO) | Admitting: Sports Medicine

## 2021-08-27 ENCOUNTER — Other Ambulatory Visit: Payer: Self-pay

## 2021-08-27 ENCOUNTER — Ambulatory Visit: Payer: Managed Care, Other (non HMO)

## 2021-08-27 DIAGNOSIS — M79672 Pain in left foot: Secondary | ICD-10-CM

## 2021-08-27 DIAGNOSIS — E1142 Type 2 diabetes mellitus with diabetic polyneuropathy: Secondary | ICD-10-CM

## 2021-08-27 DIAGNOSIS — Z9889 Other specified postprocedural states: Secondary | ICD-10-CM

## 2021-08-27 DIAGNOSIS — Z89422 Acquired absence of other left toe(s): Secondary | ICD-10-CM

## 2021-08-27 NOTE — Progress Notes (Signed)
Subjective: Maria Morris is a 41 y.o. female patient seen today in office for POV #3  (DOS 08-08-21) S/P Left 4th toe amputation. Patient denies current pain at surgical site, denies calf pain, denies headache, chest pain, shortness of breath, nausea, vomiting, fever, or chills.  Reports that she is doing good using cam boot and came into office last week for her scheduled dressing change.  No other issues noted.   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   Hypertension associated with diabetes ([place]) 11/27/2019   Amenorrhea 11/14/2019   Class 3 severe obesity due to excess calories with serious comorbidity and body mass index (BMI) of 50.0 to 59.9 in adult [facility]) 11/14/2019   GAD (generalized anxiety disorder) 11/14/2019   Moderate episode of recurrent major depressive disorder ([place]) 11/14/2019   Palpitations 11/14/2019   Polyarthralgia 11/14/2019   Well woman exam with routine gynecological exam 07/26/2019   Breast pain, left 07/26/2019    Current Outpatient Medications on File Prior to Visit  Medication Sig Dispense Refill   Blood Glucose Monitoring Suppl (TRUE METRIX METER) w/Device KIT 1 each by Does not apply route 2 (two) times a day. 1 kit 0   Continuous Blood Gluc Receiver (DEXCOM G6 RECEIVER) DEVI      Continuous Blood Gluc Sensor (DEXCOM G6 SENSOR) MISC Inject 1 Device into the skin every 14 (fourteen) days.  (Patient not taking: Reported on 08/07/2021)     Continuous Blood Gluc Transmit (DEXCOM G6  TRANSMITTER) MISC See admin instructions.     gabapentin (NEURONTIN) 800 MG tablet Take 1 tablet (800 mg total) by mouth in the morning, at noon, and at bedtime. 90 tablet 2   glucose blood (TRUE METRIX BLOOD GLUCOSE TEST) test strip Use as instructed (Patient taking differently: 1 each by Other route as directed.) 100 each 12   HYDROcodone-acetaminophen (NORCO) 10-325 MG tablet Take 1 tablet by mouth every 6 (six) hours as needed for up to 5 days. 15 tablet 0   Insulin Pen Needle (TRUEPLUS PEN NEEDLES) 32G X 4 MM MISC Use as instructed. (Patient taking differently: 1 each by Other route as directed.) 100 each 6   metFORMIN (GLUCOPHAGE) 500 MG tablet Take 1 tablet (500 mg total) by mouth 2 (two) times daily with a meal. (Patient not taking: Reported on 08/07/2021) 60 tablet 2   TRUEplus Lancets 28G MISC 1 each by Does not apply route 2 (two) times a day. 100 each 1   [DISCONTINUED] sodium chloride (OCEAN) 0.65 % SOLN nasal spray Place 1 spray into both nostrils as needed for congestion. (Patient not taking: Reported on 03/13/2019) 1 Bottle 0   No current facility-administered medications on file prior to visit.    No Known Allergies  Objective: There were no vitals filed for this visit.  General: No acute distress, AAOx3  Left foot Sutures and staples intact with no gapping or dehiscence at surgical site, mild swelling to left forefoot, no erythema, no warmth, no drainage, no signs of infection noted, Capillary fill time <3 seconds  in all remaining digits.  No pain with calf compression.   Post Op Xray, Left foot consistent with amputation.  Assessment and Plan:  Problem List Items Addressed This Visit   None Visit Diagnoses     Status post left foot surgery    -  Primary   Relevant Orders   DG Foot Complete Left   History of amputation of lesser toe of left foot (HCC)       Left foot pain       Diabetic polyneuropathy associated with type 2 diabetes mellitus ([place])             -Patient seen and evaluated -Xrays reviewed -Sutures and staples removed -Applied dry sterile dressing to surgical site left foot secured with ACE wrap and stockinet  -Advised patient to keep dressing intact for today may remove on tomorrow for shower pat dry and redress with ace wrap to assist with edema control -Continue with CAM boot until next office visit advised patient at next visit we will plan for her to bring a normal shoe and to also see Aaron Edelman to be measured for custom insoles and likely proper fitting shoe -Advised patient to limit activity to necessity  -Advised patient to ice and elevate as necessary  -Continue with PRN pain meds -Will plan for surgical site wound check and discuss return to work at next office visit. In the meantime, patient to call office if any issues or problems arise.   Landis Martins, DPM

## 2021-09-03 ENCOUNTER — Encounter: Payer: Self-pay | Admitting: Sports Medicine

## 2021-09-03 ENCOUNTER — Ambulatory Visit: Payer: Managed Care, Other (non HMO)

## 2021-09-03 ENCOUNTER — Ambulatory Visit (INDEPENDENT_AMBULATORY_CARE_PROVIDER_SITE_OTHER): Payer: Managed Care, Other (non HMO) | Admitting: Sports Medicine

## 2021-09-03 ENCOUNTER — Other Ambulatory Visit: Payer: Self-pay

## 2021-09-03 DIAGNOSIS — E1142 Type 2 diabetes mellitus with diabetic polyneuropathy: Secondary | ICD-10-CM

## 2021-09-03 DIAGNOSIS — M79672 Pain in left foot: Secondary | ICD-10-CM

## 2021-09-03 DIAGNOSIS — Z9889 Other specified postprocedural states: Secondary | ICD-10-CM

## 2021-09-03 DIAGNOSIS — Z89422 Acquired absence of other left toe(s): Secondary | ICD-10-CM

## 2021-09-03 NOTE — Progress Notes (Signed)
SITUATION Reason for Consult: Evaluation for Prefabricated Diabetic Shoes and Bilateral Custom Diabetic Inserts. Patient / Caregiver Report: Patient would like well fitting shoes  OBJECTIVE DATA: Patient History / Diagnosis:    ICD-10-CM   1. History of amputation of lesser toe of left foot (HCC)  Z89.422     2. Diabetic polyneuropathy associated with type 2 diabetes mellitus (HCC)  E11.42       Current or Previous Devices:   None and no history  In-Person Foot Examination: Ulcers & [place]ousing:   Left 4th and 5th amputation  Toe / Foot Deformities:   - Pes Planus    Shoe Size: 9.5W  ORTHOTIC RECOMMENDATION Recommended Devices: - 1x pair prefabricated PDAC approved diabetic shoes: X801W 9.5W - 3x pair custom-to-patient vacuum formed diabetic insoles.   GOALS OF SHOES AND INSOLES - Reduce shear and pressure - Reduce / Prevent callus formation - Reduce / Prevent ulceration - Protect the fragile healing compromised diabetic foot.  Patient would benefit from diabetic shoes and inserts as patient has diabetes mellitus and the patient has one or more of the following conditions: - History of partial or complete amputation of the foot - History of previous foot ulceration. - History of pre-ulcerative callus - Peripheral neuropathy with evidence of callus formation - Foot deformity - Poor circulation  ACTIONS PERFORMED Patient was casted for insoles via crush box and measured for shoes via brannock device. Procedure was explained and patient tolerated procedure well. All questions were answered and concerns addressed.  PLAN Patient is to ensure treating physician receives and completes diabetic paperwork. Casts and shoe order are to be held until paperwork is received. Once received patient is to be scheduled for fitting in four weeks.

## 2021-09-03 NOTE — Progress Notes (Signed)
Subjective: Shantanique Hodo is a 41 y.o. female patient seen today in office for POV # 4 (DOS 08-08-21) S/P Left 4th toe amputation. Patient denies current pain at surgical site, denies calf pain, denies headache, chest pain, shortness of breath, nausea, vomiting, fever, or chills.  Reports that she is doing good and tried to wear normal shoe today and is ready to return to work.  No other issues noted.   Patient is assisted by husband this visit.  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   Hypertension associated with diabetes ([place]) 11/27/2019   Amenorrhea 11/14/2019   Class 3 severe obesity due to excess calories with serious comorbidity and body mass index (BMI) of 50.0 to 59.9 in adult [facility]) 11/14/2019   GAD (generalized anxiety disorder) 11/14/2019   Moderate episode of recurrent major depressive disorder ([place]) 11/14/2019   Palpitations 11/14/2019   Polyarthralgia 11/14/2019   Well woman exam with routine gynecological exam 07/26/2019   Breast pain, left 07/26/2019    Current Outpatient Medications on File Prior to Visit  Medication Sig Dispense Refill   Blood Glucose Monitoring Suppl (TRUE METRIX METER) w/Device KIT 1 each by Does not apply route 2 (two) times a day. 1 kit 0   Continuous Blood Gluc Receiver (DEXCOM G6 RECEIVER) DEVI      Continuous Blood Gluc Sensor (DEXCOM G6 SENSOR) MISC Inject 1 Device into the skin every 14 (fourteen) days.  (Patient not taking: Reported on 08/07/2021)      Continuous Blood Gluc Transmit (DEXCOM G6 TRANSMITTER) MISC See admin instructions.     gabapentin (NEURONTIN) 800 MG tablet Take 1 tablet (800 mg total) by mouth in the morning, at noon, and at bedtime. 90 tablet 2   glucose blood (TRUE METRIX BLOOD GLUCOSE TEST) test strip Use as instructed (Patient taking differently: 1 each by Other route as directed.) 100 each 12   Insulin Pen Needle (TRUEPLUS PEN NEEDLES) 32G X 4 MM MISC Use as instructed. (Patient taking differently: 1 each by Other route as directed.) 100 each 6   metFORMIN (GLUCOPHAGE) 500 MG tablet Take 1 tablet (500 mg total) by mouth 2 (two) times daily with a meal. (Patient not taking: Reported on 08/07/2021) 60 tablet 2   TRUEplus Lancets 28G MISC 1 each by Does not apply route 2 (two) times a day. 100 each 1   [DISCONTINUED] sodium chloride (OCEAN) 0.65 % SOLN nasal spray Place 1 spray into both nostrils as needed for congestion. (Patient not taking: Reported on 03/13/2019) 1 Bottle 0   No current facility-administered medications on file prior to visit.    No Known Allergies  Objective: There were no vitals filed for this visit.  General: No acute distress, AAOx3  Left foot incision well-healed with a dry scab at the surgical site and dried blood.  Minimal swelling to left forefoot, no erythema, no warmth, no drainage, no signs of infection noted, Capillary fill time <3 seconds in all remaining digits.  No pain with calf compression.    Assessment and Plan:  Problem List Items Addressed This Visit   None Visit Diagnoses     Status post left foot surgery    -  Primary   History of amputation of lesser toe of left foot (HCC)       Left foot pain       Diabetic polyneuropathy associated with type 2 diabetes mellitus ([place])            -Patient seen and evaluated -Surgical site healing well -Patient was measured for diabetic insoles with Aaron Edelman today -Advised patient to limit activity to tolerance but does have cleared to  resume work on Sunday, 09/06/2021 -Advised patient to ice and elevate as necessary  -Continue with PRN pain meds -Will plan for postoperative check and x-rays at next visit.  Meantime patient to call office if any problems or questions arise.  Landis Martins, DPM

## 2021-09-10 ENCOUNTER — Encounter: Payer: Self-pay | Admitting: Sports Medicine

## 2021-09-14 ENCOUNTER — Other Ambulatory Visit: Payer: Self-pay | Admitting: Sports Medicine

## 2021-09-14 ENCOUNTER — Telehealth: Payer: Self-pay | Admitting: *Deleted

## 2021-09-14 MED ORDER — HYDROCODONE-ACETAMINOPHEN 10-325 MG PO TABS: 1.0000 | ORAL_TABLET | Freq: Four times a day (QID) | ORAL | 0 refills | Status: AC | PRN

## 2021-09-14 NOTE — Telephone Encounter (Signed)
"  I need a refill on my pain medicine.  My other toes are hurting now."

## 2021-09-14 NOTE — Progress Notes (Signed)
Refilled pain medication

## 2021-09-24 NOTE — Progress Notes (Signed)
[facility] Clinic Note - Initial Visit   Date: 09/25/21  Maria Morris MRN: [id number] DOB: 1981-06-28   Dear Dr. Cannon Kettle:  Thank you for your kind referral of Maria Morris for consultation of neuropathy. Although her history is well known to you, please allow [place] to reiterate it for the purpose of our medical record. The patient was accompanied to the clinic by husband who also provides collateral information.     History of Present Illness: Maria Morris is a 41 y.o. 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   She has been diabetic for the past 10 years and has had neuropathy involving the feet most of this time.  She complains of numbness/tingling in the feet and lower legs.  Symptoms are constant.  She takes gabapentin 895m TID, but does not have significant benefit.  She has not been on any other medications.  Balance is fair.  She walks unassisted and has not had any falls.  Due to poor wound healing, she has had amputation of the left fourth and fifth toes.  She also complains of pain in her hands.  Additionally, she has whole body severe pain.  Out-side paper records, electronic medical record, and images have been reviewed where available and summarized as:  Lab Results  Component Value Date   HGBA1C 7.1 (H) 08/08/2021   No results found for: [id number] Results  Component Value Date   TSH 0.811 08/02/2019   Lab Results  Component Value Date   ESRSEDRATE 45 (H) 08/07/2021    Past Medical History:  Diagnosis Date   Anemia    Anxiety    Diabetic neuropathy [place] [place]    Gallstones    GERD (gastroesophageal reflux disease)    Hypertension    Migraines    Peripheral neuropathy    Tachycardia    Type 2 diabetes mellitus ([facility] 2014    Past Surgical  History:  Procedure Laterality Date   AMPUTATION TOE Left 03/08/2020   Procedure: AMPUTATION 5th TOE;  Surgeon: SLandis Martins DPM;  [facility]  Service: Podiatry;  Laterality: Left;   AMPUTATION TOE Left 08/08/2021   Procedure: AMPUTATION TOE;  Surgeon: SLandis Martins DPM;  [facility] [facility]  Service: Podiatry;  Laterality: Left;  4th toe   CHOLECYSTECTOMY N/A 10/16/2019   Procedure: LAPAROSCOPIC CHOLECYSTECTOMY;  Surgeon: Kinsinger, LArta Bruce MD;  Location: [facility];  Service: General;  Laterality: N/A;   NO PAST SURGERIES       Medications:  Outpatient Encounter Medications as of 09/25/2021  Medication Sig Note   Blood Glucose Monitoring Suppl (TRUE METRIX METER) w/Device KIT 1 each by Does not apply route 2 (two) times a day.    Continuous Blood Gluc Receiver (DEXCOM G6 RECEIVER) DEVI     Continuous Blood Gluc Sensor (DEXCOM G6 SENSOR) MISC Inject 1 Device into the skin every 14 (fourteen) days. 03/10/2020: PROVIDER- THE PATIENT NEEDS A RX FOR THE ACTUAL RECEIVER PORTION OF THIS SYSTEM   Continuous Blood Gluc Transmit (DEXCOM G6 TRANSMITTER) MISC See admin instructions.    DULoxetine (CYMBALTA) 30 MG capsule Take 1 capsule (30 mg total) by mouth daily.    gabapentin (NEURONTIN) 800 MG tablet Take 1 tablet (800 mg total) by mouth in the morning, at noon, and at bedtime.    glucose blood (TRUE METRIX BLOOD GLUCOSE TEST) test strip Use as instructed (Patient  taking differently: 1 each by Other route as directed.)    Insulin Pen Needle (TRUEPLUS PEN NEEDLES) 32G X 4 MM MISC Use as instructed. (Patient taking differently: 1 each by Other route as directed.)    metFORMIN (GLUCOPHAGE) 500 MG tablet Take 1 tablet (500 mg total) by mouth 2 (two) times daily with a meal.    TRUEplus Lancets 28G MISC 1 each by Does not apply route 2 (two) times a day.    TRULICITY 5.92 TW/4.4QK SOPN SMARTSIG:0.5 Milliliter(s) SUB-Q Once a Week    [DISCONTINUED] sodium chloride (OCEAN) 0.65 % SOLN nasal spray  Place 1 spray into both nostrils as needed for congestion. (Patient not taking: Reported on 03/13/2019)    No facility-administered encounter medications on file as of 09/25/2021.    Allergies: No Known Allergies  Family History: Family History  Problem Relation Age of Onset   Diabetes Mother    Diabetes Father    Hypertension Father    Heart disease Father    Breast cancer Paternal Aunt    Cancer Neg Hx     Social History: Social History   Tobacco Use   Smoking status: Former    Types: Cigarettes    Quit date: 02/2019    Years since quitting: 2.5   Smokeless tobacco: Never  Vaping Use   Vaping Use: Never used  Substance Use Topics   Alcohol use: Yes    Comment: occ   Drug use: No   Social History   Social History Narrative   Right Handed    Lives in a one story home     Vital Signs:  BP 114/74    Pulse 81    Ht '5\' 7"'  (1.702 m)    Wt (!) 309 lb (140.2 kg)    SpO2 100%    BMI 48.40 kg/m    Neurological Exam: MENTAL STATUS including orientation to time, place, person, recent and remote memory, attention span and concentration, language, and fund of knowledge is normal.  Speech is not dysarthric.  CRANIAL NERVES: II:  No visual field defects.   III-IV-VI: Pupils equal round and reactive to light.  Normal conjugate, extra-ocular eye movements in all directions of gaze.  No nystagmus.  No ptosis.   V:  Normal facial sensation.    VII:  Normal facial symmetry and movements.   VIII:  Normal hearing and vestibular function.   IX-X:  Normal palatal movement.   XI:  Normal shoulder shrug and head rotation.   XII:  Normal tongue strength and range of motion, no deviation or fasciculation.  MOTOR:  s/p amputation of the left fourth and fifth toes.  No atrophy, fasciculations or abnormal movements.  No pronator drift.   Upper Extremity:  Right  Left  Deltoid  5/5   5/5   Biceps  5/5   5/5   Triceps  5/5   5/5   Infraspinatus 5/5  5/5  Medial pectoralis 5/5  5/5   Wrist extensors  5/5   5/5   Wrist flexors  5/5   5/5   Finger extensors  5/5   5/5   Finger flexors  5/5   5/5   Dorsal interossei  5/5   5/5   Abductor pollicis  5/5   5/5   Tone (Ashworth scale)  0  0   Lower Extremity:  Right  Left  Hip flexors  5/5   5/5   Hip extensors  5/5   5/5   Adductor 5/5  5/5  Abductor 5/5  5/5  Knee flexors  5/5   5/5   Knee extensors  5/5   5/5   Dorsiflexors  5/5   5/5   Plantarflexors  5/5   5/5   Toe extensors  5/5   5/5   Toe flexors  5/5   5/5   Tone (Ashworth scale)  0  0   MSRs:  Right        Left                  brachioradialis 2+  2+  biceps 2+  2+  triceps 2+  2+  patellar 2+  2+  ankle jerk 0  0  Hoffman no  no  plantar response down  down   SENSORY: Vibration is intact throughout.  Temperature andpinprick mildly reducedin the feet.  Sensation to all modalities is intact in the hands.  Romberg's sign absent.   COORDINATION/GAIT: Normal finger-to- nose-finger.  Intact rapid alternating movements bilaterally.  Gait is wide-based due to body habitus, stable, unassisted.  Tandem and stressed gait intact.    IMPRESSION: Diabetic neuropathy affecting the feet lower legs.  I had extensive discussion with the patient regarding the pathogenesis, etiology, management, and natural course of neuropathy. Neuropathy tends to be slowly progressive and it is important to optimize diabetes management to minimize this progression.  Her hemoglobin A1c is well controlled currently at 7.1.  I encouraged her to keep her blood sugars under control.  Overall, neuropathy management is symptomatic.  -For pain, and will start Cymbalta 30 mg daily  -Continue gabapentin 800 mg 3 times daily  Bilateral hand paresthesias and pain ?Progression of neuropathy vs entrapment neuropathy vs musculoskeletal pain.  To better characterize the nature of her upper extremity symptoms, I recommend electrodiagnostic testing.  Whole body pain is not characteristic of  neuropathy and may represent other pain syndrome such as fibromyalgia.  Further recommendations pending results.   Thank you for allowing me to participate in patient's care.  If I can answer any additional questions, I would be pleased to do so.    Sincerely,    Maria Janota K. Posey Pronto, DO

## 2021-09-25 ENCOUNTER — Ambulatory Visit: Payer: Managed Care, Other (non HMO) | Admitting: Neurology

## 2021-09-25 ENCOUNTER — Other Ambulatory Visit: Payer: Self-pay

## 2021-09-25 ENCOUNTER — Encounter: Payer: Self-pay | Admitting: Neurology

## 2021-09-25 VITALS — BP 114/74 | HR 81 | Ht 67.0 in | Wt 309.0 lb

## 2021-09-25 DIAGNOSIS — E1142 Type 2 diabetes mellitus with diabetic polyneuropathy: Secondary | ICD-10-CM | POA: Diagnosis not present

## 2021-09-25 DIAGNOSIS — M79641 Pain in right hand: Secondary | ICD-10-CM | POA: Diagnosis not present

## 2021-09-25 DIAGNOSIS — M79642 Pain in left hand: Secondary | ICD-10-CM

## 2021-09-25 MED ORDER — DULOXETINE HCL 30 MG PO CPEP: 30.0000 mg | ORAL_CAPSULE | Freq: Every day | ORAL | 5 refills | Status: AC

## 2021-09-25 NOTE — Patient Instructions (Signed)
Nerve testing of the hands  Start Cymbalta 30mg  daily  ELECTROMYOGRAM AND NERVE CONDUCTION STUDIES (EMG/NCS) INSTRUCTIONS  How to Prepare The neurologist conducting the EMG will need to know if you have certain medical conditions. Tell the neurologist and other EMG lab personnel if you: Have a pacemaker or any other electrical medical device Take blood-thinning medications Have hemophilia, a blood-clotting disorder that causes prolonged bleeding Bathing Take a shower or bath shortly before your exam in order to remove oils from your skin. Dont apply lotions or creams before the exam.  What to Expect Youll likely be asked to change into a hospital gown for the procedure and lie down on an examination table. The following explanations can help you understand what will happen during the exam.  Electrodes. The neurologist or a technician places surface electrodes at various locations on your skin depending on where youre experiencing symptoms. Or the neurologist may insert needle electrodes at different sites depending on your symptoms.  Sensations. The electrodes will at times transmit a tiny electrical current that you may feel as a twinge or spasm. The needle electrode may cause discomfort or pain that usually ends shortly after the needle is removed. If you are concerned about discomfort or pain, you may want to talk to the neurologist about taking a short break during the exam.  Instructions. During the needle EMG, the neurologist will assess whether there is any spontaneous electrical activity when the muscle is at rest - activity that isnt present in healthy muscle tissue - and the degree of activity when you slightly contract the muscle.  He or she will give you instructions on resting and contracting a muscle at appropriate times. Depending on what muscles and nerves the neurologist is examining, he or she may ask you to change positions during the exam.  After your EMG You may  experience some temporary, minor bruising where the needle electrode was inserted into your muscle. This bruising should fade within several days. If it persists, contact your primary care doctor.

## 2021-10-01 ENCOUNTER — Other Ambulatory Visit: Payer: Managed Care, Other (non HMO)

## 2021-10-01 ENCOUNTER — Other Ambulatory Visit: Payer: Self-pay

## 2021-10-01 ENCOUNTER — Ambulatory Visit (INDEPENDENT_AMBULATORY_CARE_PROVIDER_SITE_OTHER): Payer: Managed Care, Other (non HMO)

## 2021-10-01 ENCOUNTER — Ambulatory Visit (INDEPENDENT_AMBULATORY_CARE_PROVIDER_SITE_OTHER): Payer: Managed Care, Other (non HMO) | Admitting: Sports Medicine

## 2021-10-01 ENCOUNTER — Other Ambulatory Visit: Payer: Self-pay | Admitting: Sports Medicine

## 2021-10-01 DIAGNOSIS — Z9889 Other specified postprocedural states: Secondary | ICD-10-CM | POA: Diagnosis not present

## 2021-10-01 DIAGNOSIS — S90422A Blister (nonthermal), left great toe, initial encounter: Secondary | ICD-10-CM

## 2021-10-01 DIAGNOSIS — L84 Corns and callosities: Secondary | ICD-10-CM

## 2021-10-01 DIAGNOSIS — G8918 Other acute postprocedural pain: Secondary | ICD-10-CM

## 2021-10-01 DIAGNOSIS — Z89422 Acquired absence of other left toe(s): Secondary | ICD-10-CM

## 2021-10-01 DIAGNOSIS — E1142 Type 2 diabetes mellitus with diabetic polyneuropathy: Secondary | ICD-10-CM

## 2021-10-01 NOTE — Progress Notes (Addendum)
Subjective: Maria Morris is a 41 y.o. female patient seen today in office for POV #5 (DOS 08-08-21) S/P Left 4th toe amputation. Patient denies current pain at surgical site but states that she has noticed some soreness at the left great toe and reports she became concerned when she had rubbing at the right fifth toe and noticed some hard skin there, denies calf pain, denies headache, chest pain, shortness of breath, nausea, vomiting, fever, or chills.  Reports that she saw the neurologist and put her on Cymbalta and so far is noticing some difference this week.  No other pedal complaints noted.  Patient is assisted by husband this visit.  Patient Active Problem List   Diagnosis Date Noted   Osteomyelitis of fourth toe of left foot ([place]) 08/07/2021   Sepsis ([place]) 03/07/2020   AKI (acute kidney injury) ([place]) 03/07/2020   DM II (diabetes mellitus, type II), controlled ([place]) 03/06/2020   Osteomyelitis ([place]) 03/05/2020   Elevated C-reactive protein (CRP) 01/29/2020   Elevated sed rate 01/29/2020   Gastroesophageal reflux disease 01/29/2020   Myalgia 01/29/2020   Seasonal allergic rhinitis due to pollen 01/29/2020   Photosensitivity dermatitis 12/26/2019   Hypertension associated with diabetes ([place]) 11/27/2019   Amenorrhea 11/14/2019   Class 3 severe obesity due to excess calories with serious comorbidity and body mass index (BMI) of 50.0 to 59.9 in adult ([place]) 11/14/2019   GAD (generalized anxiety disorder) 11/14/2019   Moderate episode of recurrent major depressive disorder ([place]) 11/14/2019   Palpitations 11/14/2019   Polyarthralgia 11/14/2019   Well woman exam with routine gynecological exam 07/26/2019   Breast pain, left 07/26/2019    Current Outpatient Medications on File Prior to Visit  Medication Sig Dispense Refill   atorvastatin (LIPITOR) 20 MG tablet Take 1 tablet by mouth daily.     buPROPion (WELLBUTRIN XL) 150 MG 24 hr tablet TAKE 1 TABLET(150 MG) BY MOUTH EVERY  MORNING Strength: 150 mg     butalbital-acetaminophen-caffeine (FIORICET) 50-325-40 MG tablet TAKE ONE TABLET EVERY 4 HOURS AS NEEDED     cyclobenzaprine (FLEXERIL) 10 MG tablet Take 1 tablet by mouth 3 (three) times daily as needed.     DULoxetine (CYMBALTA) 30 MG capsule Take by mouth.     ferrous gluconate (FERGON) 324 MG tablet TAKE 1 TABLET(324 MG) BY MOUTH WITH BREAKFAST Strength: 324 mg     fluticasone (FLONASE) 50 MCG/ACT nasal spray SHAKE LIQUID AND USE 1 SPRAY IN EACH NOSTRIL DAILY AS NEEDED FOR SINUS OR SYMPTOMS     gabapentin (NEURONTIN) 800 MG tablet Take by mouth.     HYDROcodone-acetaminophen (NORCO) 10-325 MG tablet Take by mouth.     lisinopril (ZESTRIL) 20 MG tablet Take by mouth.     LORazepam (ATIVAN) 0.5 MG tablet Take by mouth.     metFORMIN (GLUCOPHAGE) 1000 MG tablet TAKE ONE TABLET TWICE DAILY WITH MEALS     pantoprazole (PROTONIX) 40 MG tablet Take by mouth.     rizatriptan (MAXALT) 10 MG tablet Take one tablet by mouth. Can take another tablet in two hours if needed.     topiramate (TOPAMAX) 25 MG tablet Take by mouth.     aspirin-acetaminophen-caffeine (EXCEDRIN MIGRAINE) 250-250-65 MG tablet Take by mouth.     Blood Glucose Monitoring Suppl (TRUE METRIX METER) w/Device KIT 1 each by Does not apply route 2 (two) times a day. 1 kit 0   buPROPion (WELLBUTRIN XL) 150 MG 24 hr tablet Take 150 mg by mouth every morning.  Continuous Blood Gluc Receiver (DEXCOM G6 RECEIVER) DEVI      Continuous Blood Gluc Sensor (DEXCOM G6 SENSOR) MISC Inject 1 Device into the skin every 14 (fourteen) days.     Continuous Blood Gluc Transmit (DEXCOM G6 TRANSMITTER) MISC See admin instructions.     cyclobenzaprine (FLEXERIL) 10 MG tablet Take 10 mg by mouth 3 (three) times daily.     DULoxetine (CYMBALTA) 30 MG capsule Take 1 capsule (30 mg total) by mouth daily. 30 capsule 5   DULoxetine (CYMBALTA) 60 MG capsule Take 60 mg by mouth daily.     ferrous gluconate (FERGON) 324 MG tablet  Take 324 mg by mouth daily.     fluticasone (FLONASE) 50 MCG/ACT nasal spray Place 1 spray into both nostrils daily.     gabapentin (NEURONTIN) 800 MG tablet Take 1 tablet (800 mg total) by mouth in the morning, at noon, and at bedtime. 90 tablet 2   glucose blood (TRUE METRIX BLOOD GLUCOSE TEST) test strip Use as instructed (Patient taking differently: 1 each by Other route as directed.) 100 each 12   Insulin Pen Needle (TRUEPLUS PEN NEEDLES) 32G X 4 MM MISC Use as instructed. (Patient taking differently: 1 each by Other route as directed.) 100 each 6   lisinopril (ZESTRIL) 20 MG tablet Take 20 mg by mouth daily.     metFORMIN (GLUCOPHAGE) 500 MG tablet Take 1 tablet (500 mg total) by mouth 2 (two) times daily with a meal. 60 tablet 2   mometasone (NASONEX) 50 MCG/ACT nasal spray Place into the nose.     Multiple Vitamin (THERA) TABS Take 1 tablet by mouth daily.     pantoprazole (PROTONIX) 40 MG tablet Take 40 mg by mouth daily.     topiramate (TOPAMAX) 100 MG tablet Take 100 mg by mouth daily.     topiramate (TOPAMAX) 25 MG tablet Take 25 mg by mouth 2 (two) times daily.     TRUEplus Lancets 28G MISC 1 each by Does not apply route 2 (two) times a day. 194 each 1   TRULICITY 1.74 YC/1.4GY SOPN SMARTSIG:0.5 Milliliter(s) SUB-Q Once a Week     [DISCONTINUED] sodium chloride (OCEAN) 0.65 % SOLN nasal spray Place 1 spray into both nostrils as needed for congestion. (Patient not taking: Reported on 03/13/2019) 1 Bottle 0   No current facility-administered medications on file prior to visit.    No Known Allergies  Objective: There were no vitals filed for this visit.  General: No acute distress, AAOx3  Left foot incision well-healed, there is a translucent blister noted to the distal tip left hallux with no active drainage no redness no warmth no swelling, Capillary fill time <3 seconds in all remaining digits x3 on the left foot.  No pain with calf compression.  Right foot: There is a corn  noted to the dorsal lateral fifth toe with no surrounding signs of infection, no erythema, no edema, no pain to palpation to right fifth hammertoe.  X-rays left foot: Consistent with postoperative status no other acute osseous findings. Assessment and Plan:  Problem List Items Addressed This Visit   None Visit Diagnoses     Post-operative pain    -  Primary   History of amputation of lesser toe of left foot (HCC)       Diabetic polyneuropathy associated with type 2 diabetes mellitus (HCC)       Relevant Medications   atorvastatin (LIPITOR) 20 MG tablet   buPROPion (WELLBUTRIN XL) 150 MG 24  hr tablet   buPROPion (WELLBUTRIN XL) 150 MG 24 hr tablet   cyclobenzaprine (FLEXERIL) 10 MG tablet   cyclobenzaprine (FLEXERIL) 10 MG tablet   DULoxetine (CYMBALTA) 30 MG capsule   DULoxetine (CYMBALTA) 60 MG capsule   gabapentin (NEURONTIN) 800 MG tablet   lisinopril (ZESTRIL) 20 MG tablet   lisinopril (ZESTRIL) 20 MG tablet   LORazepam (ATIVAN) 0.5 MG tablet   metFORMIN (GLUCOPHAGE) 1000 MG tablet   topiramate (TOPAMAX) 100 MG tablet   topiramate (TOPAMAX) 25 MG tablet   topiramate (TOPAMAX) 25 MG tablet   Status post left foot surgery       Blister of left great toe, initial encounter       Corns and callosities            -Patient seen and evaluated -Mechanically debrided corn and right fifth toe without incident using a sterile 15 blade and no additional charge -Surgical site left foot well-healed -Patient is awaiting diabetic insoles for shoes meanwhile I dispensed tube foam for patient to use to left great toe to prevent rubbing and wound at the hallux I advised patient to wear shoes that provide support and made her aware that she is at risk of more wounds if she is not in a proper fitting shoe because of the extra motion that occurs when you have only 3 digits on that foot -Advised patient to ice and elevate as necessary  -Continue with gabapentin and Cymbalta as prescribed by  neurology -Return to office in 3 weeks for toe check and as scheduled for pickup of diabetic insoles/shoes.  Landis Martins, DPM

## 2021-10-07 ENCOUNTER — Ambulatory Visit: Payer: Managed Care, Other (non HMO)

## 2021-10-07 ENCOUNTER — Other Ambulatory Visit: Payer: Self-pay

## 2021-10-07 DIAGNOSIS — E1142 Type 2 diabetes mellitus with diabetic polyneuropathy: Secondary | ICD-10-CM

## 2021-10-07 DIAGNOSIS — Z89422 Acquired absence of other left toe(s): Secondary | ICD-10-CM

## 2021-10-07 NOTE — Progress Notes (Signed)
SITUATION Reason for Visit: Fitting of Diabetic Shoes & Insoles Patient / Caregiver Report:  Patient is satisfied with fit and function  OBJECTIVE DATA: Patient History / Diagnosis:     ICD-10-CM   1. Diabetic polyneuropathy associated with type 2 diabetes mellitus (HCC)  E11.42     2. History of amputation of lesser toe of left foot (HCC)  Z89.422       Change in Status:   None  ACTIONS PERFORMED: In-Person Delivery, patient was fit with: - 1x pair [id number] PDAC approved prefabricated Diabetic Shoes: Apex [id number] - 3x pair [id number] PDAC approved CAM milled custom diabetic insoles  Shoes and insoles were verified for structural integrity and safety. Patient wore shoes and insoles in office. Skin was inspected and free of areas of concern after wearing shoes and inserts. Shoes and inserts fit properly. Patient / Caregiver provided with ferbal instruction and demonstration regarding donning, doffing, wear, care, proper fit, function, purpose, cleaning, and use of shoes and insoles ' and in all related precautions and risks and benefits regarding shoes and insoles. Patient / Caregiver was instructed to wear properly fitting socks with shoes at all times. Patient was also provided with verbal instruction regarding how to report any failures or malfunctions of shoes or inserts, and necessary follow up care. Patient / Caregiver was also instructed to contact physician regarding change in status that may affect function of shoes and inserts.   Patient / Caregiver verbalized undersatnding of instruction provided. Patient / Caregiver demonstrated independence with proper donning and doffing of shoes and inserts.  PLAN Patient to follow up as needed. Plan of care was discussed with and agreed upon by patient and/or caregiver. All questions were answered and concerns addressed.

## 2021-10-14 ENCOUNTER — Telehealth: Payer: Self-pay

## 2021-10-14 NOTE — Telephone Encounter (Signed)
[id number] and [phone] benefits per [facility]  Provider is in network. While OOP has not been met the CPT codes along with Dx codes are covered at 100%. Pt has met deductible at this time.   LVM for pt to call back to provide the info listed above.

## 2021-10-22 ENCOUNTER — Encounter: Payer: Managed Care, Other (non HMO) | Admitting: Sports Medicine

## 2021-10-22 ENCOUNTER — Encounter: Payer: Managed Care, Other (non HMO) | Admitting: Neurology

## 2021-11-19 ENCOUNTER — Encounter: Payer: Managed Care, Other (non HMO) | Admitting: Sports Medicine

## 2021-11-19 ENCOUNTER — Ambulatory Visit (INDEPENDENT_AMBULATORY_CARE_PROVIDER_SITE_OTHER): Payer: Managed Care, Other (non HMO) | Admitting: Neurology

## 2021-11-19 DIAGNOSIS — G5622 Lesion of ulnar nerve, left upper limb: Secondary | ICD-10-CM

## 2021-11-19 DIAGNOSIS — E1142 Type 2 diabetes mellitus with diabetic polyneuropathy: Secondary | ICD-10-CM | POA: Diagnosis not present

## 2021-11-19 DIAGNOSIS — G5603 Carpal tunnel syndrome, bilateral upper limbs: Secondary | ICD-10-CM

## 2021-11-19 NOTE — Procedures (Signed)
[facility]  ?[address] ? [place], [place] [address] ?Tel: [phone] ?Fax:  [phone] ?Test Date:  11/19/2021 ? ?Patient: Maria Morris DOB: [birth year] Physician: Nita Sickle, DO  ?Sex: Female Height: 5\' 11"  Ref Phys: , DO  ?ID#: Nita Sickle   Technician:   ? ?Patient Complaints: ?This is a 41 year old female referred for evaluation of bilateral hand paresthesias. ? ?NCV & EMG Findings: ?Extensive electrodiagnostic testing of the right upper extremity and additional studies of the left shows:  ?Bilateral median and left ulnar sensory responses show prolonged latency (R4.7, L5.1, L3.5 ms) and reduced amplitude (R7.3, L9.6, L8.4 ?V).  Right ulnar and left radial sensory responses are within normal limits. ?Right median motor response shows prolonged latency (5.2 ms).  Left median motor response shows prolonged latency (5.9 ms) and reduced amplitude (5.1 mV).  Left ulnar motor response shows prolonged latency (3.4 ms) and decreased conduction velocity (A Elbow-B Elbow, 32 m/s).  Right ulnar motor responses within normal limits. ?Chronic motor axonal loss changes are seen affecting the left ulnar innervated muscles, without accompanied active denervation.  ? ?Impression: ?Bilateral median neuropathy at or distal to the wrist, consistent with a clinical diagnosis of carpal tunnel syndrome.  Overall, these findings are severe in degree electrically and worse on the left. ?Left ulnar neuropathy with slowing across the elbow, predominantly demyelinating, moderate. ? ? ?___________________________ ?24, DO ? ? ? ?Nerve Conduction Studies ?Anti Sensory Summary Table ? ? Stim Site NR Peak (ms) Norm Peak (ms) P-T Amp (?V) Norm P-T Amp  ?Left Median Anti Sensory (2nd Digit)  35?C  ?Wrist    5.1 <3.4 9.6 >20  ?Right Median Anti Sensory (2nd Digit)  35?C  ?Wrist    4.7 <3.4 7.3 >20  ?Left Radial Anti Sensory (Base 1st Digit)  35?C  ?Wrist    2.3 <2.7 27.6 >18  ?Left Ulnar Anti  Sensory (5th Digit)  35?C  ?Wrist    3.5 <3.1 8.4 >12  ?Right Ulnar Anti Sensory (5th Digit)  35?C  ?Wrist    2.9 <3.1 14.9 >12  ? ?Motor Summary Table ? ? Stim Site NR Onset (ms) Norm Onset (ms) O-P Amp (mV) Norm O-P Amp Site1 Site2 Delta-0 (ms) Dist (cm) Vel (m/s) Norm Vel (m/s)  ?Left Median Motor (Abd Poll Brev)  35?C  ?Wrist    5.9 <3.9 5.1 >6 Elbow Wrist 4.8 28.0 58 >50  ?Elbow    10.7  4.1         ?Right Median Motor (Abd Poll Brev)  35?C  ?Wrist    5.2 <3.9 7.7 >6 Elbow Wrist 5.0 29.0 58 >50  ?Elbow    10.2  6.6         ?Left Ulnar Motor (Abd Dig Minimi)  35?C  ?Wrist    3.4 <3.1 7.5 >7 B Elbow Wrist 4.6 23.0 50 >50  ?B Elbow    8.0  6.2  A Elbow B Elbow 3.1 10.0 32 >50  ?A Elbow    11.1  5.4         ?Right Ulnar Motor (Abd Dig Minimi)  35?C  ?Wrist    3.0 <3.1 9.6 >7 B Elbow Wrist 5.0 23.0 51 >50  ?B Elbow    8.0  8.1  A Elbow B Elbow 1.7 10.0 59 >50  ?A Elbow    9.7  7.6         ? ?EMG ? ? Side Muscle Ins Act Fibs Psw Fasc  Number Recrt Dur Dur. Amp Amp. Poly Poly. Comment  ?Right 1stDorInt Nml Nml Nml Nml Nml Nml Nml Nml Nml Nml Nml Nml N/A  ?Right Abd Poll Brev Nml Nml Nml Nml Nml Nml Nml Nml Nml Nml Nml Nml N/A  ?Right PronatorTeres Nml Nml Nml Nml Nml Nml Nml Nml Nml Nml Nml Nml N/A  ?Right Biceps Nml Nml Nml Nml Nml Nml Nml Nml Nml Nml Nml Nml N/A  ?Right Deltoid Nml Nml Nml Nml Nml Nml Nml Nml Nml Nml Nml Nml N/A  ?Right Triceps Nml Nml Nml Nml Nml Nml Nml Nml Nml Nml Nml Nml N/A  ?Left 1stDorInt Nml Nml Nml Nml 1- Rapid Some 1+ Some 1+ Some 1+ N/A  ?Left Abd Poll Brev Nml Nml Nml Nml 1- Rapid Some 1+ Some 1+ Some 1+ N/A  ?Left PronatorTeres Nml Nml Nml Nml Nml Nml Nml Nml Nml Nml Nml Nml N/A  ?Left Biceps Nml Nml Nml Nml Nml Nml Nml Nml Nml Nml Nml Nml N/A  ?Left Triceps Nml Nml Nml Nml Nml Nml Nml Nml Nml Nml Nml Nml N/A  ?Left Deltoid Nml Nml Nml Nml Nml Nml Nml Nml Nml Nml Nml Nml N/A  ?Left ABD Dig Min Nml Nml Nml Nml 1- Rapid Some 1+ Some 1+ Some 1+ N/A  ?Left FlexCarpiUln Nml Nml Nml Nml Nml  Nml Nml Nml Nml Nml Nml Nml N/A  ? ? ? ? ?Waveforms: ?    ? ?    ? ?    ? ? ?

## 2021-11-20 ENCOUNTER — Other Ambulatory Visit: Payer: Self-pay

## 2021-11-20 DIAGNOSIS — G5603 Carpal tunnel syndrome, bilateral upper limbs: Secondary | ICD-10-CM

## 2021-12-03 ENCOUNTER — Ambulatory Visit (INDEPENDENT_AMBULATORY_CARE_PROVIDER_SITE_OTHER): Payer: Managed Care, Other (non HMO) | Admitting: Orthopedic Surgery

## 2021-12-03 ENCOUNTER — Encounter: Payer: Self-pay | Admitting: Orthopedic Surgery

## 2021-12-03 DIAGNOSIS — R200 Anesthesia of skin: Secondary | ICD-10-CM | POA: Diagnosis not present

## 2021-12-03 NOTE — Progress Notes (Signed)
? ?Office Visit Note ?  ?Patient: Maria Morris           ?Date of Birth: [birth year]           ?MRN: [id number] ?Visit Date: 12/03/2021 ?             ?Requested by: Glendale Chard, DO ?[address] ?[address] ?[place],  [place] [address] ?PCP: Jordan Hawks, PA-C ? ? ?Assessment & Plan: ?Visit Diagnoses:  ?1. Bilateral hand numbness   ? ? ?Plan: Discussed with patient that her history, exam findings and electrodiagnostic studies are all consistent with carpal tunnel syndrome.  She does have significant peripheral neuropathy involving bilateral upper and lower extremities including numbness and tingling in her feet for which she takes gabapentin.  She is a type II diabetic but is unsure of her blood sugar.  She does not monitor this and has not had it checked in quite some time.  We discussed the nature of carpal tunnel syndrome and various treatment options including bracing, corticosteroid injection, or carpal tunnel surgery.  At this point she would like to try a night brace.  She is also interested in surgery but I would like for her to follow-up with her primary care doctor first to evaluate her blood sugar control.  She will call the office when she sees her primary care provider to discuss further. ? ?Follow-Up Instructions: No follow-ups on file.  ? ?Orders:  ?No orders of the defined types were placed in this encounter. ? ?No orders of the defined types were placed in this encounter. ? ? ? ? Procedures: ?No procedures performed ? ? ?Clinical Data: ?No additional findings. ? ? ?Subjective: ?Chief Complaint  ?Patient presents with  ? Left Hand - New Patient (Initial Visit)  ? Right Hand - New Patient (Initial Visit)  ? ? ?This is a 41 year old right-hand-dominant female who presents with numbness and tingling in the bilateral hands.  This been going on for many year.  She describes numbness and tingling involving all of her fingers but notes that her thumb, index, middle, and ring fingers are most  consistently bothersome.  This wakes her up at night although she is unsure how frequently because she does not sleep much at baseline.  She has to rub her hands for symptom relief when this occurs.  Her left side seems to be more affected than the right side.  She is occasionally dropping things.  She had an EMG on 11/19/2021 which suggested severe bilateral carpal tunnel syndrome.  She has not had any treatment so far.  She does have a history of type 2 diabetes with associated peripheral neuropathy involving bilateral upper and lower extremities.  She takes gabapentin for bilateral lower extremity neuropathic pain.  She does not check her blood sugar at home and she is not sure what her most recent A1c was.  She denies any history of hypothyroidism, wrist trauma, cervical spine issue, or inflammatory arthropathy. ? ? ?Review of Systems ? ? ?Objective: ?Vital Signs: BP 138/83 (BP Location: Left Arm, Patient Position: Sitting, Cuff Size: Large)   Pulse 92   Ht 5\' 7"  (1.702 m)   Wt 300 lb (136.1 kg)   SpO2 94%   BMI 46.99 kg/m?  ? ?Physical Exam ?Constitutional:   ?   Appearance: Normal appearance.  ?Cardiovascular:  ?   Rate and Rhythm: Normal rate.  ?   Pulses: Normal pulses.  ?Pulmonary:  ?   Effort: Pulmonary effort is  normal.  ?Skin: ?   General: Skin is warm and dry.  ?   Capillary Refill: Capillary refill takes less than 2 seconds.  ?Neurological:  ?   Mental Status: She is alert.  ? ? ?Right Hand Exam  ? ?Tenderness  ?The patient is experiencing no tenderness.  ? ?Range of Motion  ?The patient has normal right wrist ROM.  ? ?Other  ?Erythema: absent ?Sensation: decreased ?Pulse: present ? ?Comments:  Positive Tinel and Phalen signs.  Negative Tinel at elbow.  2PD >9215mm in all fingers.  5/5 thenar motor without atrophy.  ? ? ?Left Hand Exam  ? ?Tenderness  ?The patient is experiencing no tenderness.  ? ?Range of Motion  ?The patient has normal left wrist ROM. ? ?Other  ?Erythema: absent ?Sensation:  decreased ?Pulse: present ? ?Comments:  Positive Tinel and Phalen signs.  Negative Tinel at elbow.  2PD >4115mm in all fingers.  5/5 thenar motor without atrophy.  ? ? ? ? ?Specialty Comments:  ?No specialty comments available. ? ?Imaging: ?No results found. ? ? ?PMFS History: ?Patient Active Problem List  ? Diagnosis Date Noted  ? Bilateral hand numbness 12/03/2021  ? Osteomyelitis of fourth toe of left foot (HCC) 08/07/2021  ? Sepsis (HCC) 03/07/2020  ? AKI (acute kidney injury) (HCC) 03/07/2020  ? DM II (diabetes mellitus, type II), controlled (HCC) 03/06/2020  ? Osteomyelitis (HCC) 03/05/2020  ? Elevated C-reactive protein (CRP) 01/29/2020  ? Elevated sed rate 01/29/2020  ? Gastroesophageal reflux disease 01/29/2020  ? Myalgia 01/29/2020  ? Seasonal allergic rhinitis due to pollen 01/29/2020  ? Photosensitivity dermatitis 12/26/2019  ? Hypertension associated with diabetes (HCC) 11/27/2019  ? Amenorrhea 11/14/2019  ? Class 3 severe obesity due to excess calories with serious comorbidity and body mass index (BMI) of 50.0 to 59.9 in adult [facility](HCC) 11/14/2019  ? GAD (generalized anxiety disorder) 11/14/2019  ? Moderate episode of recurrent major depressive disorder (HCC) 11/14/2019  ? Palpitations 11/14/2019  ? Polyarthralgia 11/14/2019  ? Well woman exam with routine gynecological exam 07/26/2019  ? Breast pain, left 07/26/2019  ? ?Past Medical History:  ?Diagnosis Date  ? Anemia   ? Anxiety   ? Diabetic neuropathy (HCC)   ? Gallstones   ? GERD (gastroesophageal reflux disease)   ? Hypertension   ? Migraines   ? Peripheral neuropathy   ? Tachycardia   ? Type 2 diabetes mellitus (HCC) 2014  ?  ?Family History  ?Problem Relation Age of Onset  ? Diabetes Mother   ? Diabetes Father   ? Hypertension Father   ? Heart disease Father   ? Breast cancer Paternal Aunt   ? Cancer Neg Hx   ?  ?Past Surgical History:  ?Procedure Laterality Date  ? AMPUTATION TOE Left 03/08/2020  ? Procedure: AMPUTATION 5th TOE;  Surgeon: Asencion IslamStover,  Titorya, DPM;  Location: [facility] OR;  Service: Podiatry;  Laterality: Left;  ? AMPUTATION TOE Left 08/08/2021  ? Procedure: AMPUTATION TOE;  Surgeon: Asencion IslamStover, Titorya, DPM;  Location: [facility] OR;  Service: Podiatry;  Laterality: Left;  4th toe  ? CHOLECYSTECTOMY N/A 10/16/2019  ? Procedure: LAPAROSCOPIC CHOLECYSTECTOMY;  Surgeon: Kinsinger, De BlanchLuke Aaron, MD;  Location: [facility] ORS;  Service: General;  Laterality: N/A;  ? NO PAST SURGERIES    ? ?Social History  ? ?Occupational History  ? Not on file  ?Tobacco Use  ? Smoking status: Former  ?  Types: Cigarettes  ?  Quit date: 02/2019  ?  Years since quitting: 2.7  ?  Smokeless tobacco: Never  ?Vaping Use  ? Vaping Use: Never used  ?Substance and Sexual Activity  ? Alcohol use: Yes  ?  Comment: occ  ? Drug use: No  ? Sexual activity: Yes  ?  Birth control/protection: None  ? ? ? ? ? ? ?

## 2021-12-10 ENCOUNTER — Ambulatory Visit (INDEPENDENT_AMBULATORY_CARE_PROVIDER_SITE_OTHER): Payer: Managed Care, Other (non HMO) | Admitting: Sports Medicine

## 2021-12-10 DIAGNOSIS — B351 Tinea unguium: Secondary | ICD-10-CM

## 2021-12-10 DIAGNOSIS — Z89422 Acquired absence of other left toe(s): Secondary | ICD-10-CM | POA: Diagnosis not present

## 2021-12-10 DIAGNOSIS — G8918 Other acute postprocedural pain: Secondary | ICD-10-CM

## 2021-12-10 DIAGNOSIS — E1142 Type 2 diabetes mellitus with diabetic polyneuropathy: Secondary | ICD-10-CM

## 2021-12-10 DIAGNOSIS — M79675 Pain in left toe(s): Secondary | ICD-10-CM

## 2021-12-10 DIAGNOSIS — S90812A Abrasion, left foot, initial encounter: Secondary | ICD-10-CM

## 2021-12-10 DIAGNOSIS — M79674 Pain in right toe(s): Secondary | ICD-10-CM

## 2021-12-10 MED ORDER — HYDROCODONE-ACETAMINOPHEN 10-325 MG PO TABS: 1.0000 | ORAL_TABLET | Freq: Three times a day (TID) | ORAL | 0 refills | Status: AC | PRN

## 2021-12-10 NOTE — Progress Notes (Signed)
Subjective: ?Maria Morris is a 41 y.o. 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 Patient denies current pain at surgical site but states that she still occassional gets pain and swelling at end of day. Taking Cymbalta as rx by neurologist.  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  No other pedal complaints noted. ? ?Patient is assisted by husband this visit. ? ?Patient Active Problem List  ? Diagnosis Date Noted  ? Bilateral hand numbness 12/03/2021  ? Osteomyelitis of fourth toe of left foot ([place]) 08/07/2021  ? Sepsis ([place]) 03/07/2020  ? AKI (acute kidney injury) ([place]) 03/07/2020  ? DM II (diabetes mellitus, type II), controlled ([place]) 03/06/2020  ? Osteomyelitis ([place]) 03/05/2020  ? Elevated C-reactive protein (CRP) 01/29/2020  ? Elevated sed rate 01/29/2020  ? Gastroesophageal reflux disease 01/29/2020  ? Myalgia 01/29/2020  ? Seasonal allergic rhinitis due to pollen 01/29/2020  ? Photosensitivity dermatitis 12/26/2019  ? Hypertension associated with diabetes ([place]) 11/27/2019  ? Amenorrhea 11/14/2019  ? Class 3 severe obesity due to excess calories with serious comorbidity and body mass index (BMI) of 50.0 to 59.9 in adult [facility]) 11/14/2019  ? GAD (generalized anxiety disorder) 11/14/2019  ? Moderate episode of recurrent major depressive disorder ([place]) 11/14/2019  ? Palpitations 11/14/2019  ? Polyarthralgia 11/14/2019  ? Well woman exam with routine gynecological exam 07/26/2019  ? Breast pain, left 07/26/2019  ? ? ?Current Outpatient Medications on File Prior to Visit  ?Medication Sig Dispense Refill  ? aspirin-acetaminophen-caffeine (EXCEDRIN MIGRAINE) 250-250-65 MG tablet Take by mouth.    ? atorvastatin (LIPITOR) 20 MG tablet Take 1 tablet by mouth daily.    ? Blood Glucose Monitoring Suppl (TRUE METRIX METER) w/Device KIT 1 each by Does not apply route 2  (two) times a day. 1 kit 0  ? buPROPion (WELLBUTRIN XL) 150 MG 24 hr tablet TAKE 1 TABLET(150 MG) BY MOUTH EVERY MORNING Strength: 150 mg    ? buPROPion (WELLBUTRIN XL) 150 MG 24 hr tablet Take 150 mg by mouth every morning.    ? butalbital-acetaminophen-caffeine (FIORICET) 50-325-40 MG tablet TAKE ONE TABLET EVERY 4 HOURS AS NEEDED    ? Continuous Blood Gluc Receiver ([place]) DEVI     ? Continuous Blood Gluc Sensor (DEXCOM G6 SENSOR) MISC Inject 1 Device into the skin every 14 (fourteen) days.    ? Continuous Blood Gluc Transmit (DEXCOM G6 TRANSMITTER) MISC See admin instructions.    ? cyclobenzaprine (FLEXERIL) 10 MG tablet Take 1 tablet by mouth 3 (three) times daily as needed.    ? cyclobenzaprine (FLEXERIL) 10 MG tablet Take 10 mg by mouth 3 (three) times daily.    ? DULoxetine (CYMBALTA) 30 MG capsule Take 1 capsule (30 mg total) by mouth daily. 30 capsule 5  ? DULoxetine (CYMBALTA) 30 MG capsule Take by mouth.    ? DULoxetine (CYMBALTA) 60 MG capsule Take 60 mg by mouth daily.    ? ferrous gluconate (FERGON) 324 MG tablet TAKE 1 TABLET(324 MG) BY MOUTH WITH BREAKFAST Strength: 324 mg    ? ferrous gluconate (FERGON) 324 MG tablet Take 324 mg by mouth daily.    ? fluticasone (FLONASE) 50 MCG/ACT nasal spray SHAKE LIQUID AND USE 1 SPRAY IN EACH NOSTRIL DAILY AS NEEDED FOR SINUS OR SYMPTOMS    ? fluticasone (FLONASE) 50 MCG/ACT nasal spray Place  1 spray into both nostrils daily.    ? gabapentin (NEURONTIN) 800 MG tablet Take 1 tablet (800 mg total) by mouth in the morning, at noon, and at bedtime. 90 tablet 2  ? gabapentin (NEURONTIN) 800 MG tablet Take by mouth.    ? glucose blood (TRUE METRIX BLOOD GLUCOSE TEST) test strip Use as instructed (Patient taking differently: 1 each by Other route as directed.) 100 each 12  ? Insulin Pen Needle (TRUEPLUS PEN NEEDLES) 32G X 4 MM MISC Use as instructed. (Patient taking differently: 1 each by Other route as directed.) 100 each 6  ? lisinopril (ZESTRIL) 20 MG  tablet Take by mouth.    ? lisinopril (ZESTRIL) 20 MG tablet Take 20 mg by mouth daily.    ? LORazepam (ATIVAN) 0.5 MG tablet Take by mouth.    ? metFORMIN (GLUCOPHAGE) 1000 MG tablet TAKE ONE TABLET TWICE DAILY WITH MEALS    ? metFORMIN (GLUCOPHAGE) 500 MG tablet Take 1 tablet (500 mg total) by mouth 2 (two) times daily with a meal. 60 tablet 2  ? mometasone (NASONEX) 50 MCG/ACT nasal spray Place into the nose.    ? Multiple Vitamin (THERA) TABS Take 1 tablet by mouth daily.    ? pantoprazole (PROTONIX) 40 MG tablet Take by mouth.    ? pantoprazole (PROTONIX) 40 MG tablet Take 40 mg by mouth daily.    ? rizatriptan (MAXALT) 10 MG tablet Take one tablet by mouth. Can take another tablet in two hours if needed.    ? topiramate (TOPAMAX) 100 MG tablet Take 100 mg by mouth daily.    ? topiramate (TOPAMAX) 25 MG tablet Take by mouth.    ? topiramate (TOPAMAX) 25 MG tablet Take 25 mg by mouth 2 (two) times daily.    ? TRUEplus Lancets 28G MISC 1 each by Does not apply route 2 (two) times a day. 100 each 1  ? TRULICITY 0.35 WS/5.6CL SOPN SMARTSIG:0.5 Milliliter(s) SUB-Q Once a Week    ? [DISCONTINUED] sodium chloride (OCEAN) 0.65 % SOLN nasal spray Place 1 spray into both nostrils as needed for congestion. (Patient not taking: Reported on 03/13/2019) 1 Bottle 0  ? ?No current facility-administered medications on file prior to visit.  ? ? ?No Known Allergies ? ?Objective: ?There were no vitals filed for this visit. ? ?General: No acute distress, AAOx3  ?Left foot incision well-healed, there is a small abrasion noted to the left third toe, no redness no warmth no swelling, no acute signs of infection at this time, capillary fill time <3 seconds in all remaining digits x3 on the left foot.  No pain with calf compression.  ?Right foot: There is a corn noted to the dorsal lateral fifth toe with no surrounding signs of infection, unchanged from prior, no erythema, no edema, no pain to palpation to right fifth  hammertoe. ? ?Assessment and Plan:  ?Problem List Items Addressed This Visit   ?None ?Visit Diagnoses   ? ? Abrasion of left foot, initial encounter    -  Primary  ? Pain due to onychomycosis of toenails of both feet      ? Diabetic polyneuropathy associated with type 2 diabetes mellitus ([place])      ? History of amputation of lesser toe of left foot ([place])      ? Post-operative pain      ? ?  ? ? ? ?-Patient seen and evaluated ?-Corn at right fifth toe is very minimal this visit ?-Surgical site left  foot well-healed ?-Abrasion to the left third toe no signs of infection advised patient twice weekly to apply Neosporin and closely monitor if area worsens to come to office sooner ?-Advised patient to continue with diabetic insoles for shoes since the diabetic shoes are a slip hazard at work and continue with shoes that do not crowd or rub her toes ?-At this time only I did give patient a few tablets of pain medicine to help for any additional breakthrough pain that not relieved by gabapentin or Cymbalta as prescribed by her neurologist I did advise patient if she continues to have pain then she should talk further with her PCP and cannot get any more refills on pain medicine after today's visit ?-Return to office as scheduled for continued foot care especially since she is diabetic and preventative care to avoid future further amputation. ? ?Landis Martins, DPM ? ?

## 2022-01-26 ENCOUNTER — Ambulatory Visit: Payer: Managed Care, Other (non HMO) | Admitting: Physician Assistant

## 2022-01-26 ENCOUNTER — Encounter: Payer: Self-pay | Admitting: Physician Assistant

## 2022-01-26 DIAGNOSIS — G43019 Migraine without aura, intractable, without status migrainosus: Secondary | ICD-10-CM | POA: Diagnosis not present

## 2022-01-26 NOTE — Progress Notes (Signed)
New Patient Office Visit  Subjective    Patient ID: Maria Morris, female    DOB: 1980-11-05  Age: 41 y.o. MRN: [id number]  CC:  Chief Complaint  Patient presents with   Headache   Virtual Visit via Telephone Note  I connected with Maria Morris on 01/26/22 at  2:40 PM EDT by telephone and verified that I am speaking with the correct person using two identifiers.  Location: Patient: Car Provider: [facility]    I discussed the limitations, risks, security and privacy concerns of performing an evaluation and management service by telephone and the availability of in person appointments. I also discussed with the patient that there may be a patient responsible charge related to this service. The patient expressed understanding and agreed to proceed.   History of Present Illness:  Maria Morris states that she has been experiencing a migraine headache for the past 3 days.  States that she feels pressure in her sinuses, states that she has been having all over body aches, but attributes that to the migraine headache.  Denies nasal drainage, fever, chills.  Denies nausea or vomiting, endorses photophobia.    Denies sick contacts.  States that she has used Fioricet, Maxalt, as well as her maintenance Topamax medication that she takes without relief.  States that she is eating and drinking okay.    Observations/Objective: Medical history and current medications reviewed, no physical exam completed   Outpatient Encounter Medications as of 01/26/2022  Medication Sig   aspirin-acetaminophen-caffeine (EXCEDRIN MIGRAINE) 250-250-65 MG tablet Take by mouth.   atorvastatin (LIPITOR) 20 MG tablet Take 1 tablet by mouth daily.   Blood Glucose Monitoring Suppl (TRUE METRIX METER) w/Device KIT 1 each by Does not apply route 2 (two) times a day.   buPROPion (WELLBUTRIN XL) 150 MG 24 hr tablet TAKE 1 TABLET(150 MG) BY MOUTH EVERY MORNING Strength: 150 mg    buPROPion (WELLBUTRIN XL) 150 MG 24 hr tablet Take 150 mg by mouth every morning.   butalbital-acetaminophen-caffeine (FIORICET) 50-325-40 MG tablet TAKE ONE TABLET EVERY 4 HOURS AS NEEDED   Continuous Blood Gluc Receiver (DEXCOM G6 RECEIVER) DEVI    Continuous Blood Gluc Sensor (DEXCOM G6 SENSOR) MISC Inject 1 Device into the skin every 14 (fourteen) days.   Continuous Blood Gluc Transmit (DEXCOM G6 TRANSMITTER) MISC See admin instructions.   cyclobenzaprine (FLEXERIL) 10 MG tablet Take 1 tablet by mouth 3 (three) times daily as needed.   cyclobenzaprine (FLEXERIL) 10 MG tablet Take 10 mg by mouth 3 (three) times daily.   DULoxetine (CYMBALTA) 30 MG capsule Take 1 capsule (30 mg total) by mouth daily.   DULoxetine (CYMBALTA) 30 MG capsule Take by mouth.   DULoxetine (CYMBALTA) 60 MG capsule Take 60 mg by mouth daily.   ferrous gluconate (FERGON) 324 MG tablet TAKE 1 TABLET(324 MG) BY MOUTH WITH BREAKFAST Strength: 324 mg   ferrous gluconate (FERGON) 324 MG tablet Take 324 mg by mouth daily.   fluticasone (FLONASE) 50 MCG/ACT nasal spray SHAKE LIQUID AND USE 1 SPRAY IN EACH NOSTRIL DAILY AS NEEDED FOR SINUS OR SYMPTOMS   fluticasone (FLONASE) 50 MCG/ACT nasal spray Place 1 spray into both nostrils daily.   gabapentin (NEURONTIN) 800 MG tablet Take 1 tablet (800 mg total) by mouth in the morning, at noon, and at bedtime.   gabapentin (NEURONTIN) 800 MG tablet Take by mouth.   glucose blood (TRUE METRIX BLOOD GLUCOSE TEST) test strip Use as instructed (Patient  taking differently: 1 each by Other route as directed.)   Insulin Pen Needle (TRUEPLUS PEN NEEDLES) 32G X 4 MM MISC Use as instructed. (Patient taking differently: 1 each by Other route as directed.)   lisinopril (ZESTRIL) 20 MG tablet Take by mouth.   lisinopril (ZESTRIL) 20 MG tablet Take 20 mg by mouth daily.   LORazepam (ATIVAN) 0.5 MG tablet Take by mouth.   metFORMIN (GLUCOPHAGE) 1000 MG tablet TAKE ONE TABLET TWICE DAILY WITH MEALS    metFORMIN (GLUCOPHAGE) 500 MG tablet Take 1 tablet (500 mg total) by mouth 2 (two) times daily with a meal.   mometasone (NASONEX) 50 MCG/ACT nasal spray Place into the nose.   Multiple Vitamin (THERA) TABS Take 1 tablet by mouth daily.   pantoprazole (PROTONIX) 40 MG tablet Take by mouth.   pantoprazole (PROTONIX) 40 MG tablet Take 40 mg by mouth daily.   rizatriptan (MAXALT) 10 MG tablet Take one tablet by mouth. Can take another tablet in two hours if needed.   topiramate (TOPAMAX) 100 MG tablet Take 100 mg by mouth daily.   topiramate (TOPAMAX) 25 MG tablet Take by mouth.   topiramate (TOPAMAX) 25 MG tablet Take 25 mg by mouth 2 (two) times daily.   TRUEplus Lancets 28G MISC 1 each by Does not apply route 2 (two) times a day.   TRULICITY 1.70 YF/7.4BS SOPN SMARTSIG:0.5 Milliliter(s) SUB-Q Once a Week   [DISCONTINUED] sodium chloride (OCEAN) 0.65 % SOLN nasal spray Place 1 spray into both nostrils as needed for congestion. (Patient not taking: Reported on 03/13/2019)   No facility-administered encounter medications on file as of 01/26/2022.    Past Medical History:  Diagnosis Date   Anemia    Anxiety    Diabetic neuropathy (HCC)    Gallstones    GERD (gastroesophageal reflux disease)    Hypertension    Migraines    Peripheral neuropathy    Tachycardia    Type 2 diabetes mellitus ([place]) 2014    Past Surgical History:  Procedure Laterality Date   AMPUTATION TOE Left 03/08/2020   Procedure: AMPUTATION 5th TOE;  Surgeon: Landis Martins, DPM;  Location: [facility];  Service: Podiatry;  Laterality: Left;   AMPUTATION TOE Left 08/08/2021   Procedure: AMPUTATION TOE;  Surgeon: Landis Martins, DPM;  Location: [facility];  Service: Podiatry;  Laterality: Left;  4th toe   CHOLECYSTECTOMY N/A 10/16/2019   Procedure: LAPAROSCOPIC CHOLECYSTECTOMY;  Surgeon: Kinsinger, Arta Bruce, MD;  Location: [facility];  Service: General;  Laterality: N/A;   NO PAST SURGERIES      Family History  Problem Relation Age  of Onset   Diabetes Mother    Diabetes Father    Hypertension Father    Heart disease Father    Breast cancer Paternal Aunt    Cancer Neg Hx     Social History   Socioeconomic History   Marital status: Married    Spouse name: Danielle Mink   Number of children: 0   Years of education: Not on file   Highest education level: 12th grade  Occupational History   Not on file  Tobacco Use   Smoking status: Former    Types: Cigarettes    Quit date: 02/2019    Years since quitting: 2.9   Smokeless tobacco: Never  Vaping Use   Vaping Use: Never used  Substance and Sexual Activity   Alcohol use: Yes    Comment: occ   Drug use: No   Sexual activity: Yes  Birth control/protection: None  Other Topics Concern   Not on file  Social History Narrative   Right Handed    Lives in a one story home    Social Determinants of Health   Financial Resource Strain: Not on file  Food Insecurity: Not on file  Transportation Needs: Not on file  Physical Activity: Not on file  Stress: Not on file  Social Connections: Not on file  Intimate Partner Violence: Not on file    Review of Systems  Constitutional:  Negative for chills and fever.  HENT:  Positive for sinus pain. Negative for congestion, ear pain, hearing loss and sore throat.   Eyes:  Positive for photophobia.  Respiratory:  Negative for cough and shortness of breath.   Cardiovascular:  Negative for chest pain.  Gastrointestinal:  Negative for diarrhea, nausea and vomiting.  Genitourinary: Negative.   Musculoskeletal:  Positive for myalgias.  Skin: Negative.   Neurological:  Positive for headaches. Negative for speech change, seizures and weakness.  Endo/Heme/Allergies: Negative.   Psychiatric/Behavioral: Negative.          Assessment & Plan:   Problem List Items Addressed This Visit   None Visit Diagnoses     Intractable migraine without aura and without status migrainosus    -  Primary       Assessment and  Plan: 1. Intractable migraine without aura and without status migrainosus Patient is taking Topamax on a daily basis, has been using Maxalt and Fioricet without relief.  Patient encouraged to present to emergency department for prompt evaluation.  Patient encouraged to follow-up with her primary care provider and/or neurology to consider accelerating her treatment program Red flags given.  Patient understands and agrees.    Follow Up Instructions:    I discussed the assessment and treatment plan with the patient. The patient was provided an opportunity to ask questions and all were answered. The patient agreed with the plan and demonstrated an understanding of the instructions.   The patient was advised to call back or seek an in-person evaluation if the symptoms worsen or if the condition fails to improve as anticipated.  I provided 12 minutes of non-face-to-face time during this encounter.   Kosei Rhodes S Mayers, PA-C   Return if symptoms worsen or fail to improve.   Loraine Grip Mayers, PA-C

## 2022-01-26 NOTE — Patient Instructions (Signed)
I am sorry that you are not feeling well.  I do encourage you to present to the emergency room so you can be further evaluated and hopefully they have some other solutions to help manage your migraine.  Please let us know if there is anything else we can do for you.  Roney Jaffe, PA-C Physician Assistant [facility] [email or web address]   Migraine Headache A migraine headache is an intense, throbbing pain on one side or both sides of the head. Migraine headaches may also cause other symptoms, such as nausea, vomiting, and sensitivity to light and noise. A migraine headache can last from 4 hours to 3 days. Talk with your doctor about what things may bring on (trigger) your migraine headaches. What are the causes? The exact cause of this condition is not known. However, a migraine may be caused when nerves in the brain become irritated and release chemicals that cause inflammation of blood vessels. This inflammation causes pain. This condition may be triggered or caused by: Drinking alcohol. Smoking. Taking medicines, such as: Medicine used to treat chest pain (nitroglycerin). Birth control pills. Estrogen. Certain blood pressure medicines. Eating or drinking products that contain nitrates, glutamate, aspartame, or tyramine. Aged cheeses, chocolate, or caffeine may also be triggers. Doing physical activity. Other things that may trigger a migraine headache include: Menstruation. Pregnancy. Hunger. Stress. Lack of sleep or too much sleep. Weather changes. Fatigue. What increases the risk? The following factors may make you more likely to experience migraine headaches: Being a certain age. This condition is more common in people who are 12-3 years old. Being female. Having a family history of migraine headaches. Being Caucasian. Having a mental health condition, such as depression or anxiety. Being obese. What are the signs  or symptoms? The main symptom of this condition is pulsating or throbbing pain. This pain may: Happen in any area of the head, such as on one side or both sides. Interfere with daily activities. Get worse with physical activity. Get worse with exposure to bright lights or loud noises. Other symptoms may include: Nausea. Vomiting. Dizziness. General sensitivity to bright lights, loud noises, or smells. Before you get a migraine headache, you may get warning signs (an aura). An aura may include: Seeing flashing lights or having blind spots. Seeing bright spots, halos, or zigzag lines. Having tunnel vision or blurred vision. Having numbness or a tingling feeling. Having trouble talking. Having muscle weakness. Some people have symptoms after a migraine headache (postdromal phase), such as: Feeling tired. Difficulty concentrating. How is this diagnosed? A migraine headache can be diagnosed based on: Your symptoms. A physical exam. Tests, such as: CT scan or an MRI of the head. These imaging tests can help rule out other causes of headaches. Taking fluid from the spine (lumbar puncture) and analyzing it (cerebrospinal fluid analysis, or CSF analysis). How is this treated? This condition may be treated with medicines that: Relieve pain. Relieve nausea. Prevent migraine headaches. Treatment for this condition may also include: Acupuncture. Lifestyle changes like avoiding foods that trigger migraine headaches. Biofeedback. Cognitive behavioral therapy. Follow these instructions at home: Medicines Take over-the-counter and prescription medicines only as told by your health care provider. Ask your health care provider if the medicine prescribed to you: Requires you to avoid driving or using heavy machinery. Can cause constipation. You may need to take these actions to prevent or treat constipation: Drink enough fluid to keep your urine pale yellow. Take over-the-counter or  prescription medicines. Eat foods  that are high in fiber, such as beans, whole grains, and fresh fruits and vegetables. Limit foods that are high in fat and processed sugars, such as fried or sweet foods. Lifestyle Do not drink alcohol. Do not use any products that contain nicotine or tobacco, such as cigarettes, e-cigarettes, and chewing tobacco. If you need help quitting, ask your health care provider. Get at least 8 hours of sleep every night. Find ways to manage stress, such as meditation, deep breathing, or yoga. General instructions     Keep a journal to find out what may trigger your migraine headaches. For example, write down: What you eat and drink. How much sleep you get. Any change to your diet or medicines. If you have a migraine headache: Avoid things that make your symptoms worse, such as bright lights. It may help to lie down in a dark, quiet room. Do not drive or use heavy machinery. Ask your health care provider what activities are safe for you while you are experiencing symptoms. Keep all follow-up visits as told by your health care provider. This is important. Contact a health care provider if: You develop symptoms that are different or more severe than your usual migraine headache symptoms. You have more than 15 headache days in one month. Get help right away if: Your migraine headache becomes severe. Your migraine headache lasts longer than 72 hours. You have a fever. You have a stiff neck. You have vision loss. Your muscles feel weak or like you cannot control them. You start to lose your balance often. You have trouble walking. You faint. You have a seizure. Summary A migraine headache is an intense, throbbing pain on one side or both sides of the head. Migraines may also cause other symptoms, such as nausea, vomiting, and sensitivity to light and noise. This condition may be treated with medicines and lifestyle changes. You may also need to avoid certain  things that trigger a migraine headache. Keep a journal to find out what may trigger your migraine headaches. Contact your health care provider if you have more than 15 headache days in a month or you develop symptoms that are different or more severe than your usual migraine headache symptoms. This information is not intended to replace advice given to you by your health care provider. Make sure you discuss any questions you have with your health care provider. Document Revised: 12/01/2018 Document Reviewed: 09/21/2018 Elsevier Patient Education  2023 [employer].

## 2022-02-03 ENCOUNTER — Encounter (HOSPITAL_COMMUNITY): Payer: Self-pay | Admitting: Emergency Medicine

## 2022-02-03 ENCOUNTER — Emergency Department (HOSPITAL_COMMUNITY): Payer: Managed Care, Other (non HMO)

## 2022-02-03 ENCOUNTER — Emergency Department (HOSPITAL_COMMUNITY)
Admission: EM | Admit: 2022-02-03 | Discharge: 2022-02-04 | Disposition: A | Discharge status: Home / Self Care | Payer: Managed Care, Other (non HMO) | Attending: Emergency Medicine | Admitting: Emergency Medicine

## 2022-02-03 ENCOUNTER — Other Ambulatory Visit: Payer: Self-pay

## 2022-02-03 DIAGNOSIS — I10 Essential (primary) hypertension: Secondary | ICD-10-CM | POA: Insufficient documentation

## 2022-02-03 DIAGNOSIS — Z79899 Other long term (current) drug therapy: Secondary | ICD-10-CM | POA: Diagnosis not present

## 2022-02-03 DIAGNOSIS — Z794 Long term (current) use of insulin: Secondary | ICD-10-CM | POA: Insufficient documentation

## 2022-02-03 DIAGNOSIS — E109 Type 1 diabetes mellitus without complications: Secondary | ICD-10-CM | POA: Insufficient documentation

## 2022-02-03 DIAGNOSIS — R519 Headache, unspecified: Secondary | ICD-10-CM | POA: Insufficient documentation

## 2022-02-03 DIAGNOSIS — R0789 Other chest pain: Secondary | ICD-10-CM | POA: Insufficient documentation

## 2022-02-03 DIAGNOSIS — Z7984 Long term (current) use of oral hypoglycemic drugs: Secondary | ICD-10-CM | POA: Insufficient documentation

## 2022-02-03 DIAGNOSIS — R079 Chest pain, unspecified: Secondary | ICD-10-CM | POA: Diagnosis present

## 2022-02-03 LAB — CBC
HCT: 38.8 % (ref 36.0–46.0)
Hemoglobin: 12.9 g/dL (ref 12.0–15.0)
MCH: 27.4 pg (ref 26.0–34.0)
MCHC: 33.2 g/dL (ref 30.0–36.0)
MCV: 82.6 fL (ref 80.0–100.0)
Platelets: 233 10*3/uL (ref 150–400)
RBC: 4.7 MIL/uL (ref 3.87–5.11)
RDW: 15.1 % (ref 11.5–15.5)
WBC: 11.2 10*3/uL — ABNORMAL HIGH (ref 4.0–10.5)
nRBC: 0 % (ref 0.0–0.2)

## 2022-02-03 LAB — BASIC METABOLIC PANEL
Anion gap: 13 (ref 5–15)
BUN: 16 mg/dL (ref 6–20)
CO2: 22 mmol/L (ref 22–32)
Calcium: 9.6 mg/dL (ref 8.9–10.3)
Chloride: 102 mmol/L (ref 98–111)
Creatinine, Ser: 0.88 mg/dL (ref 0.44–1.00)
GFR, Estimated: >60 mL/min (ref >60)
Glucose, Bld: 178 mg/dL — ABNORMAL HIGH (ref 70–99)
Potassium: 3.4 mmol/L — ABNORMAL LOW (ref 3.5–5.1)
Sodium: 137 mmol/L (ref 135–145)

## 2022-02-03 LAB — I-STAT BETA HCG BLOOD, ED (MC, WL, AP ONLY): I-stat hCG, quantitative: <5 m[IU]/mL (ref <5)

## 2022-02-03 LAB — CBG MONITORING, ED: Glucose-Capillary: 196 mg/dL — ABNORMAL HIGH (ref 70–99)

## 2022-02-03 LAB — TROPONIN I (HIGH SENSITIVITY): Troponin I (High Sensitivity): 3 ng/L (ref <18)

## 2022-02-03 IMAGING — DX DG CHEST 1V PORT
1 series · 1 of 1 positions shown · non-contrast
Comparison: Chest CTA [DATE].

CLINICAL DATA: Mid chest pain.

EXAM:
PORTABLE CHEST 1 VIEW

[chest ap]
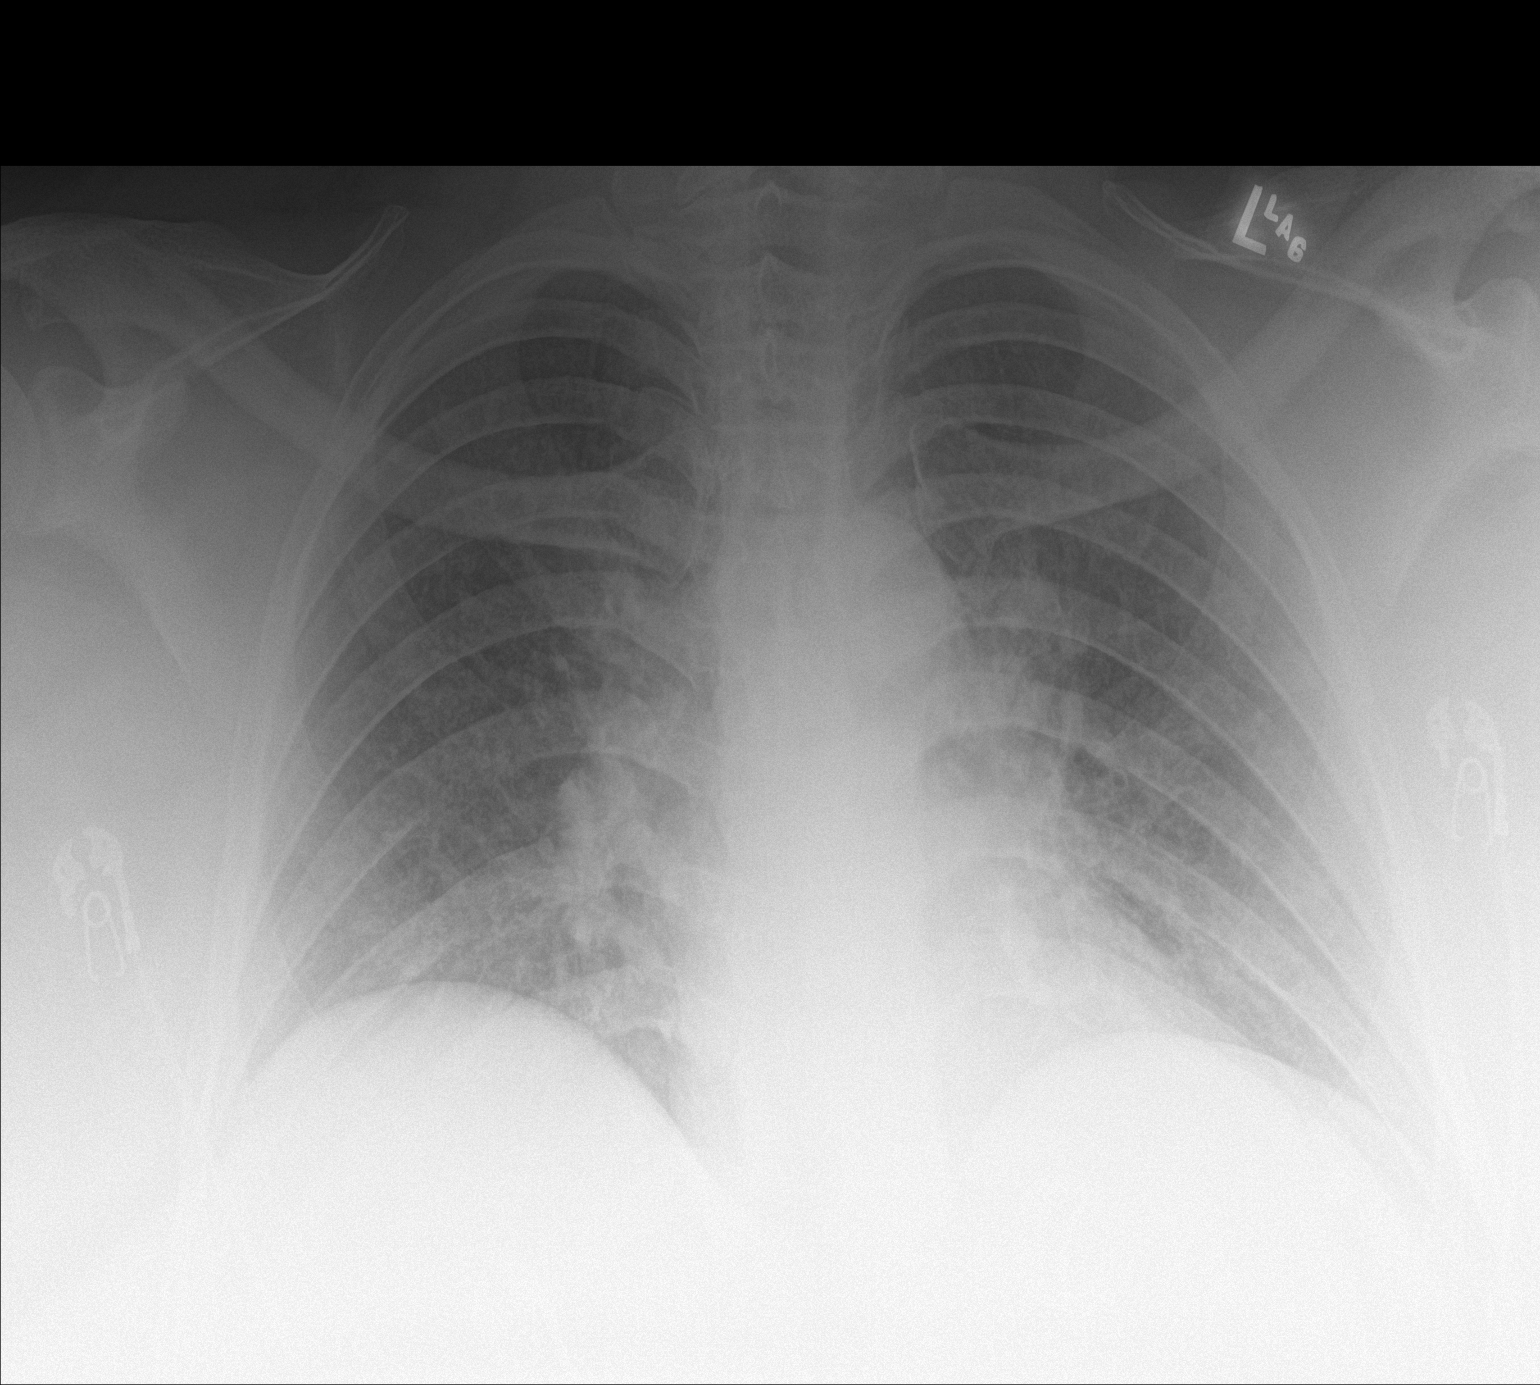

[1 of 1 positions shown; findings below may reference images not displayed]

FINDINGS: Exam technically limited by body habitus and low inspiration. There
are hazy opacities in the hypoinflated lung bases which could be due
to atelectasis or pneumonia.

The mid to upper lung fields are clear. No pleural effusion is seen
mild elevation of the right hemidiaphragm.

There is mild cardiomegaly. The mediastinum is normally outlined.
The central vessels appear to be normal in caliber.
IMPRESSION: Limited expiratory study. There is hazy atelectasis or pneumonitis
in the bases. PA and lateral study in full inspiration recommended.

## 2022-02-03 IMAGING — CT CT HEAD W/O CM
3 series · 14 of 47 positions shown, 16 images · non-contrast
Comparison: None Available.

CLINICAL DATA: Headache, sudden, severe



[Series 2: head wo · axial · 0.50mm/px · z∈[-128,+2]mm · 8 of 32 slices shown, 10 images]
[im 3/32  brain]
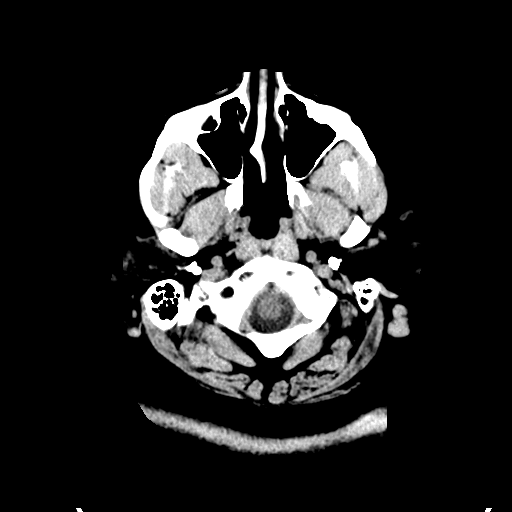
[im 3/32  bone]
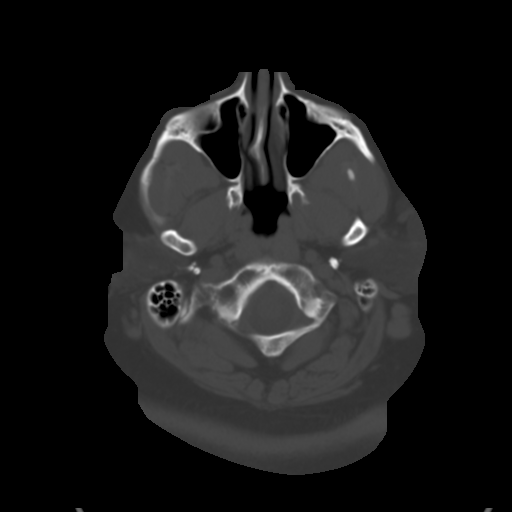
[im 7/32  brain]
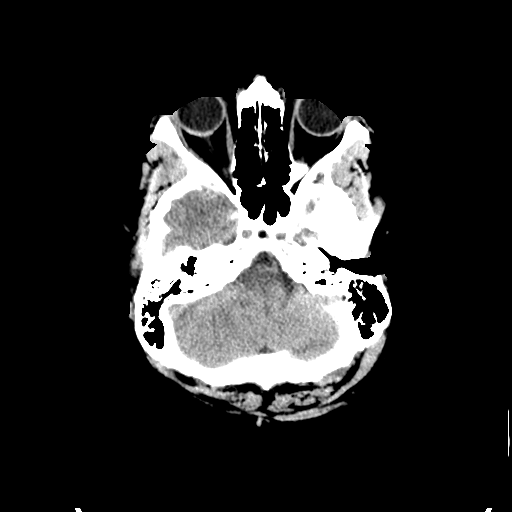
[im 10/32  brain]
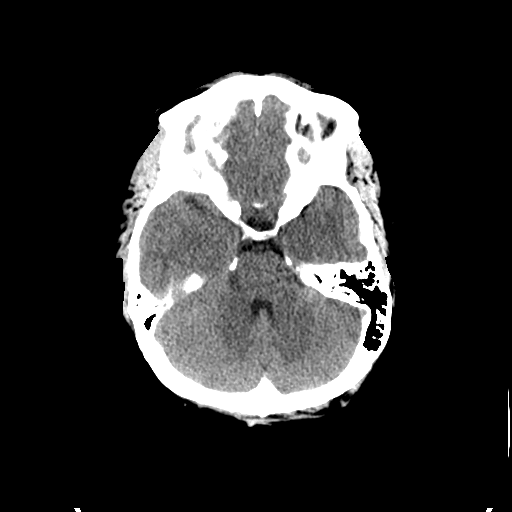
[im 14/32  brain]
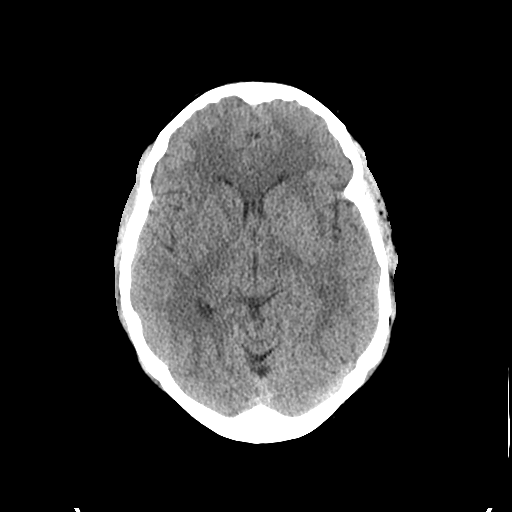
[im 18/32  brain]
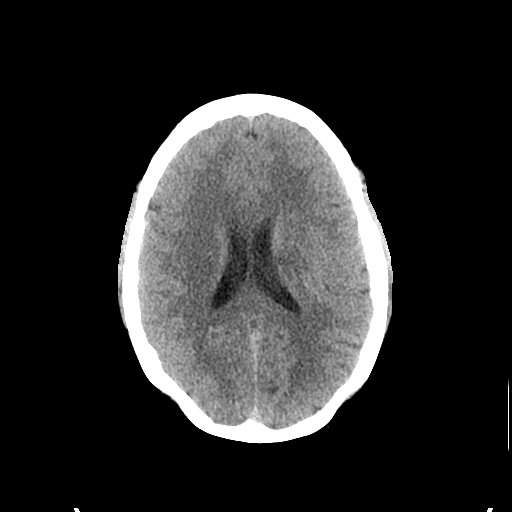
[im 18/32  bone]
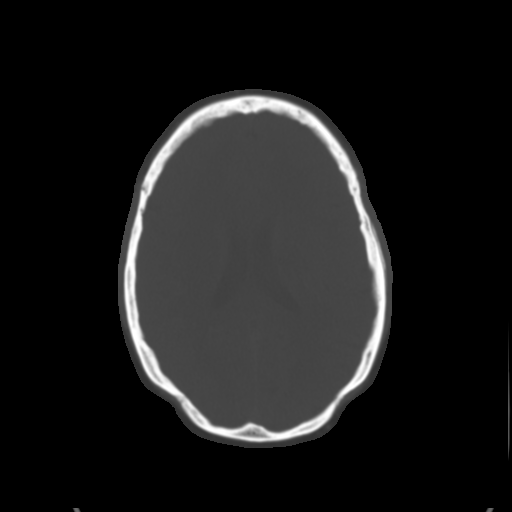
[im 22/32  brain]
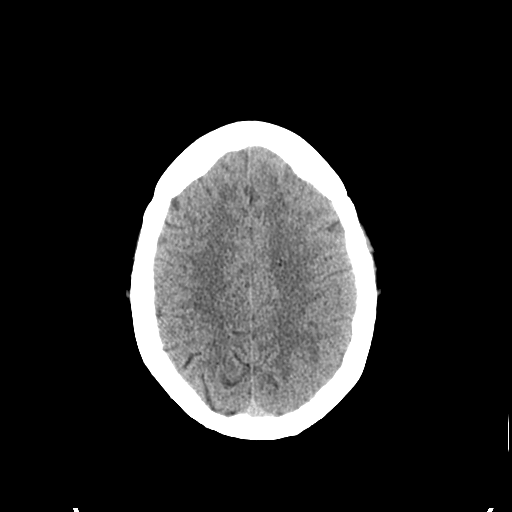
[im 25/32  brain]
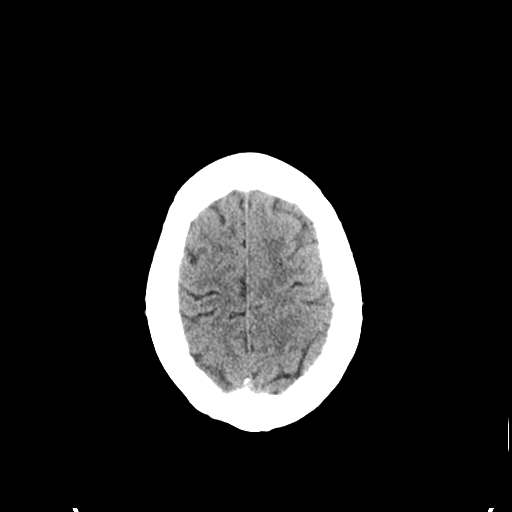
[im 29/32  brain]
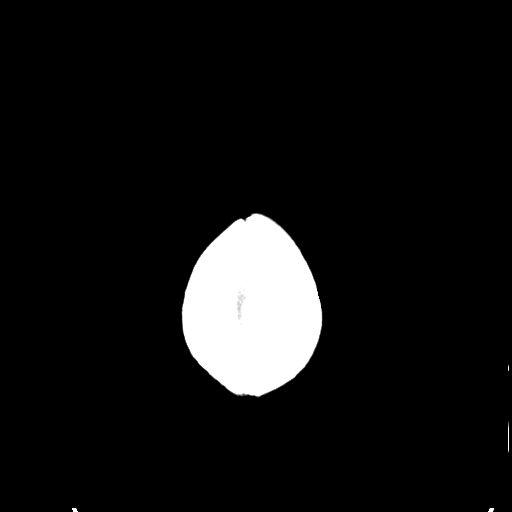

[Series 4: coronal soft tissue · coronal · 0.31mm/px · 3 of 63 slices shown]
[im 21/63  brain]
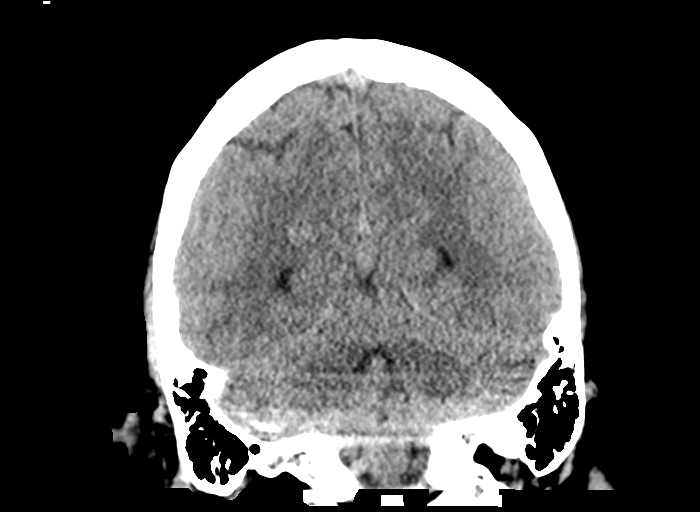
[im 28/63  brain]
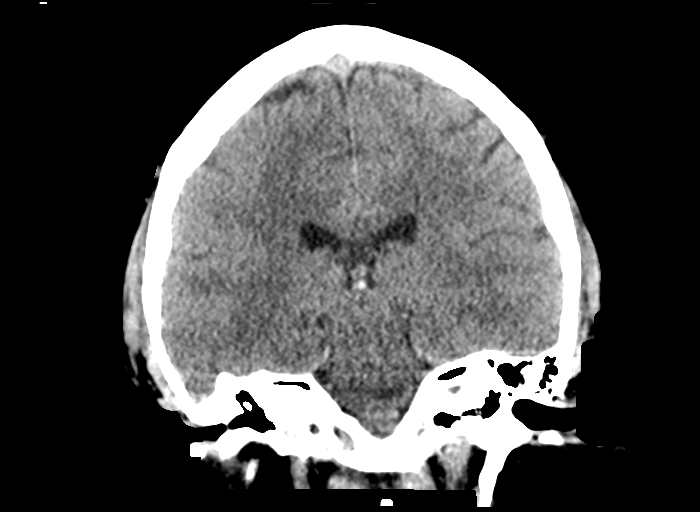
[im 35/63  brain]
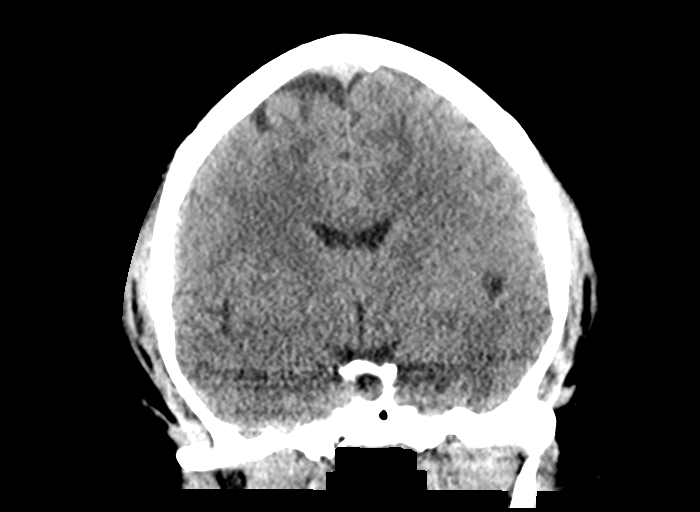

[Series 5: sagittal soft tissue · sagittal · 0.34mm/px · 3 of 55 slices shown]
[im 19/55  brain]
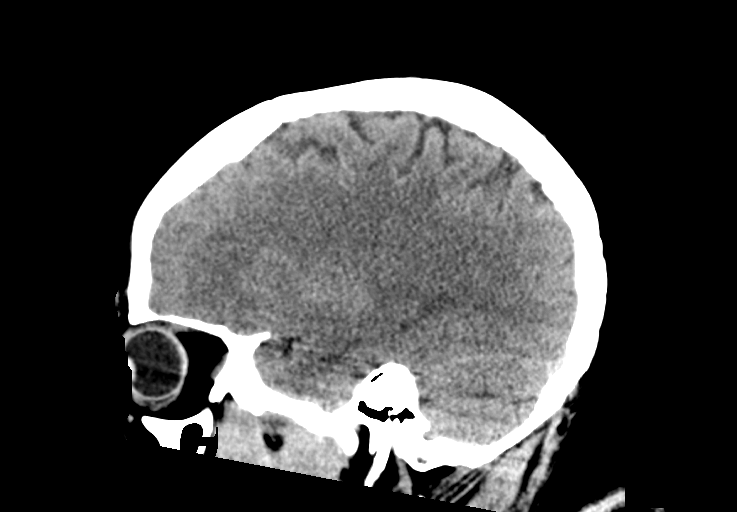
[im 28/55  brain]
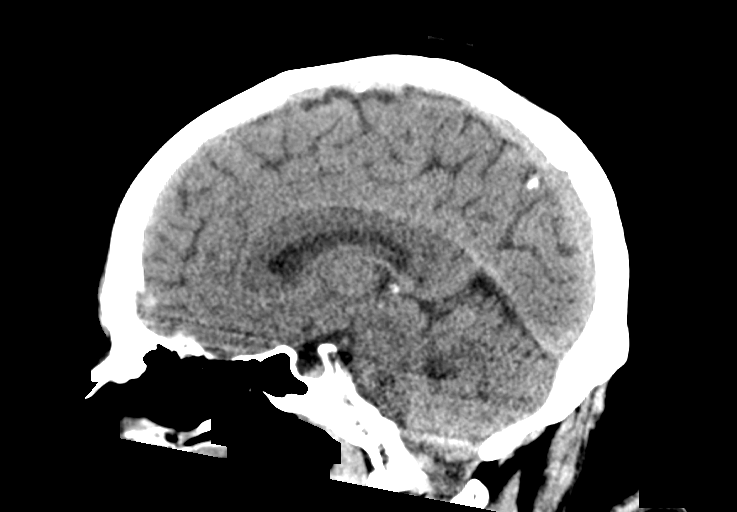
[im 37/55  brain]
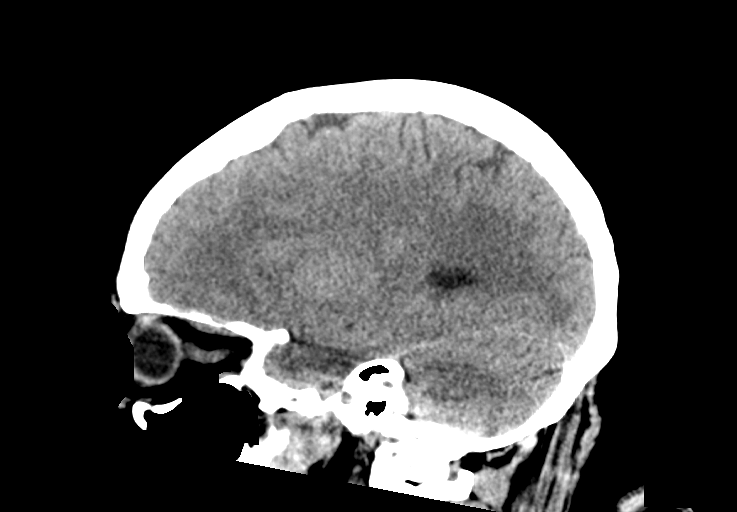

[14 of 47 positions shown; findings below may reference images not displayed]

FINDINGS: Brain: Normal anatomic configuration. No abnormal intra or
extra-axial mass lesion or fluid collection. No abnormal mass effect
or midline shift. No evidence of acute intracranial hemorrhage or
infarct. Ventricular size is normal. Cerebellum unremarkable.

Vascular: Unremarkable

Skull: Intact

Sinuses/Orbits: Paranasal sinuses are clear. Orbits are
unremarkable.

Other: Mastoid air cells and middle ear cavities are clear.
IMPRESSION: No acute intracranial abnormality.

## 2022-02-03 MED ORDER — METOCLOPRAMIDE HCL 5 MG/ML IJ SOLN
10.0000 mg | Freq: Once | INTRAMUSCULAR | Status: AC
Start: 2022-02-03 — End: 2022-02-03
  Administered 2022-02-03: 10 mg via INTRAVENOUS
  Filled 2022-02-03: qty 2

## 2022-02-03 MED ORDER — IOHEXOL 350 MG/ML SOLN
100.0000 mL | Freq: Once | INTRAVENOUS | Status: AC | PRN
  Administered 2022-02-03: 100 mL via INTRAVENOUS

## 2022-02-03 MED ORDER — HYDROMORPHONE HCL 1 MG/ML IJ SOLN
1.0000 mg | Freq: Once | INTRAMUSCULAR | Status: AC
  Administered 2022-02-03: 1 mg via INTRAVENOUS
  Filled 2022-02-03: qty 1

## 2022-02-03 NOTE — ED Provider Notes (Signed)
[facility] DEPT Provider Note   CSN: [id number] Arrival date & time: 02/03/22  2100     History  Chief Complaint  Patient presents with   Chest Pain    Maria Morris is a 41 y.o. female.  Presents emerged apartment due to concern for chest pain.  Patient reports pain ongoing for the last 3 to 4 days.  Central, radiating to her back.  10 out of 10 in severity, sharp, stabbing.  No associated difficulty in breathing.  Over the past couple days is also been having headache, frontal, nonradiating.  No associated numbness or weakness in her arms or legs.  No alleviating or aggravating factors.  Pain occurring at rest and not associated with exertion.  Has history of insulin-dependent diabetes.  Review chart, last PCP visit for migraine, 01/26/2022, virtual visit  HPI     Home Medications Prior to Admission medications   Medication Sig Start Date End Date Taking? Authorizing Provider  gabapentin (NEURONTIN) 800 MG tablet Take 1 tablet (800 mg total) by mouth in the morning, at noon, and at bedtime. 07/30/21  Yes Stover, Titorya, DPM  atorvastatin (LIPITOR) 20 MG tablet Take 20 mg by mouth daily. 07/30/13   [provider]  Blood Glucose Monitoring Suppl (TRUE METRIX METER) w/Device KIT 1 each by Does not apply route 2 (two) times a day. 03/22/19   Argentina Donovan, PA-C  buPROPion (WELLBUTRIN XL) 150 MG 24 hr tablet Take 150 mg by mouth every morning. 09/07/21   [provider]  Continuous Blood Gluc Receiver ([place]) [place]  09/10/20   [provider]  Continuous Blood Gluc Sensor (DEXCOM G6 SENSOR) MISC Inject 1 Device into the skin every 14 (fourteen) days. 03/04/20   [provider]  Continuous Blood Gluc Transmit (DEXCOM G6 TRANSMITTER) MISC See admin instructions. 03/11/20   [provider]  DULoxetine (CYMBALTA) 30 MG capsule Take 1 capsule (30 mg total) by mouth daily. 09/25/21   Patel, Arvin Collard K, DO   glucose blood (TRUE METRIX BLOOD GLUCOSE TEST) test strip Use as instructed Patient taking differently: 1 each by Other route as directed. 03/22/19   Argentina Donovan, PA-C  Insulin Pen Needle (TRUEPLUS PEN NEEDLES) 32G X 4 MM MISC Use as instructed. Patient taking differently: 1 each by Other route as directed. 10/10/19   Fulp, Cammie, MD  metFORMIN (GLUCOPHAGE) 500 MG tablet Take 1 tablet (500 mg total) by mouth 2 (two) times daily with a meal. 07/27/19   Fulp, Cammie, MD  TRUEplus Lancets 28G MISC 1 each by Does not apply route 2 (two) times a day. 03/22/19   Argentina Donovan, PA-C  sodium chloride (OCEAN) 0.65 % SOLN nasal spray Place 1 spray into both nostrils as needed for congestion. Patient not taking: Reported on 03/13/2019 07/24/18 03/13/19  Arville Lime, PA-C      Allergies    Patient has no known allergies.    Review of Systems   Review of Systems  Constitutional:  Negative for chills and fever.  HENT:  Negative for ear pain and sore throat.   Eyes:  Negative for pain and visual disturbance.  Respiratory:  Negative for cough and shortness of breath.   Cardiovascular:  Positive for chest pain. Negative for palpitations.  Gastrointestinal:  Negative for abdominal pain and vomiting.  Genitourinary:  Negative for dysuria and hematuria.  Musculoskeletal:  Positive for back pain. Negative for arthralgias.  Skin:  Negative for color change and  rash.  Neurological:  Negative for seizures and syncope.  All other systems reviewed and are negative.   Physical Exam Updated Vital Signs BP 125/76   Pulse 82   Temp 98.1 F (36.7 C) (Oral)   Resp 17   LMP  (LMP Unknown)   SpO2 95%  Physical Exam Vitals and nursing note reviewed.  Constitutional:      General: She is not in acute distress.    Appearance: She is well-developed.  HENT:     Head: Normocephalic and atraumatic.  Eyes:     Conjunctiva/sclera: Conjunctivae normal.  Cardiovascular:     Rate and Rhythm: Normal rate  and regular rhythm.     Heart sounds: No murmur heard. Pulmonary:     Effort: Pulmonary effort is normal. No respiratory distress.     Breath sounds: Normal breath sounds.  Abdominal:     Palpations: Abdomen is soft.     Tenderness: There is no abdominal tenderness.  Musculoskeletal:        General: No swelling.     Cervical back: Neck supple.     Right lower leg: No edema.     Left lower leg: No edema.  Skin:    General: Skin is warm and dry.     Capillary Refill: Capillary refill takes less than 2 seconds.  Neurological:     Mental Status: She is alert.  Psychiatric:        Mood and Affect: Mood normal.     ED Results / Procedures / Treatments   Labs (all labs ordered are listed, but only abnormal results are displayed) Labs Reviewed  BASIC METABOLIC PANEL - Abnormal; Notable for the following components:      Result Value   Potassium 3.4 (*)    Glucose, Bld 178 (*)    All other components within normal limits  CBC - Abnormal; Notable for the following components:   WBC 11.2 (*)    All other components within normal limits  CBG MONITORING, ED - Abnormal; Notable for the following components:   Glucose-Capillary 196 (*)    All other components within normal limits  I-STAT BETA HCG BLOOD, ED ([facility], [facility], [facility] ONLY)  TROPONIN I (HIGH SENSITIVITY)  TROPONIN I (HIGH SENSITIVITY)    EKG EKG Interpretation  Date/Time:  Wednesday February 03 2022 21:24:06 EDT Ventricular Rate:  88 PR Interval:  143 QRS Duration: 104 QT Interval:  384 QTC Calculation: 465 R Axis:   99 Text Interpretation: Sinus rhythm Borderline right axis deviation Confirmed by Madalyn Rob [phone]) on 02/03/2022 10:03:31 PM  Radiology CT Angio Chest/Abd/Pel for Dissection W and/or Wo Contrast  Result Date: 02/03/2022 CLINICAL DATA:  Chest and back pain.  Concern for aortic dissection. EXAM: CT ANGIOGRAPHY CHEST, ABDOMEN AND PELVIS TECHNIQUE: Non-contrast CT of the chest was initially obtained.  Multidetector CT imaging through the chest, abdomen and pelvis was performed using the standard protocol during bolus administration of intravenous contrast. Multiplanar reconstructed images and MIPs were obtained and reviewed to evaluate the vascular anatomy. RADIATION DOSE REDUCTION: This exam was performed according to the departmental dose-optimization program which includes automated exposure control, adjustment of the mA and/or kV according to patient size and/or use of iterative reconstruction technique. CONTRAST:  164m OMNIPAQUE IOHEXOL 350 MG/ML SOLN COMPARISON:  Chest CT dated 03/10/2020. FINDINGS: CTA CHEST FINDINGS Cardiovascular: There is no cardiomegaly or pericardial effusion. The thoracic aorta is unremarkable. The origins of the great vessels of the aortic arch and the central pulmonary arteries  are patent. Mediastinum/Nodes: No hilar or mediastinal adenopathy. The esophagus and the thyroid gland are grossly unremarkable. No mediastinal fluid collection. Lungs/Pleura: The lungs are clear. There is no pleural effusion pneumothorax. The central airways are patent. Musculoskeletal: No acute osseous pathology. Review of the MIP images confirms the above findings. CTA ABDOMEN AND PELVIS FINDINGS VASCULAR Aorta: Normal caliber aorta without aneurysm, dissection, vasculitis or significant stenosis. Celiac: Patent without evidence of aneurysm, dissection, vasculitis or significant stenosis. SMA: Patent without evidence of aneurysm, dissection, vasculitis or significant stenosis. Renals: Both renal arteries are patent without evidence of aneurysm, dissection, vasculitis, fibromuscular dysplasia or significant stenosis. IMA: Patent without evidence of aneurysm, dissection, vasculitis or significant stenosis. Inflow: Patent without evidence of aneurysm, dissection, vasculitis or significant stenosis. Veins: No obvious venous abnormality within the limitations of this arterial phase study. Review of the MIP  images confirms the above findings. NON-VASCULAR No intra-abdominal free air or free fluid. Hepatobiliary: Fatty liver. No intrahepatic biliary dilatation. Cholecystectomy. Pancreas: Unremarkable. No pancreatic ductal dilatation or surrounding inflammatory changes. Spleen: Small calcific granulomas. The spleen is otherwise unremarkable. Adrenals/Urinary Tract: The adrenal glands are unremarkable. The kidneys, visualized ureters, and urinary bladder appear unremarkable. Stomach/Bowel: There is sigmoid diverticulosis without active inflammatory changes. There is no bowel obstruction or active inflammation. The appendix is normal. Lymphatic: No adenopathy. Reproductive: The uterus is anteverted and grossly unremarkable. A 6.2 x 5.2 cm fatty lesion with a focus of calcification in the posterior pelvis most consistent with a teratoma. Other: None Musculoskeletal: No acute or significant osseous findings. Review of the MIP images confirms the above findings. IMPRESSION: 1. No acute intrathoracic, abdominal, or pelvic pathology. No aortic aneurysm or dissection. 2. Sigmoid diverticulosis without active inflammatory changes. No bowel obstruction. Normal appendix. 3. Fatty liver. 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 Electronically Signed   By: Anner Crete M.D.   On: 02/03/2022 23:40   CT Head Wo Contrast  Result Date: 02/03/2022 CLINICAL DATA:  Headache, sudden, severe EXAM: CT HEAD WITHOUT CONTRAST TECHNIQUE: Contiguous axial images were obtained from the base of the skull through the vertex without intravenous contrast. RADIATION DOSE REDUCTION: This exam was performed according to the departmental dose-optimization program which includes automated exposure control, adjustment of the mA and/or kV according to patient size and/or use of iterative reconstruction technique. COMPARISON:  None Available. FINDINGS: Brain: Normal anatomic configuration. No abnormal intra or extra-axial mass lesion or fluid  collection. No abnormal mass effect or midline shift. No evidence of acute intracranial hemorrhage or infarct. Ventricular size is normal. Cerebellum unremarkable. Vascular: Unremarkable Skull: Intact Sinuses/Orbits: Paranasal sinuses are clear. Orbits are unremarkable. Other: Mastoid air cells and middle ear cavities are clear. IMPRESSION: No acute intracranial abnormality. Electronically Signed   By: Fidela Salisbury M.D.   On: 02/03/2022 23:36   DG Chest Port 1 View  Result Date: 02/03/2022 CLINICAL DATA:  Mid chest pain. EXAM: PORTABLE CHEST 1 VIEW COMPARISON:  Chest CTA 03/10/2020. FINDINGS: Exam technically limited by body habitus and low inspiration. There are hazy opacities in the hypoinflated lung bases which could be due to atelectasis or pneumonia. The mid to upper lung fields are clear. No pleural effusion is seen mild elevation of the right hemidiaphragm. There is mild cardiomegaly. The mediastinum is normally outlined. The central vessels appear to be normal in caliber. IMPRESSION: Limited expiratory study. There is hazy atelectasis or pneumonitis in the bases. PA and lateral study in full inspiration recommended. Electronically Signed   By:  Telford Nab M.D.   On: 02/03/2022 22:03    Procedures Procedures    Medications Ordered in ED Medications  HYDROmorphone (DILAUDID) injection 1 mg (1 mg Intravenous Given 02/03/22 2217)  metoCLOPramide (REGLAN) injection 10 mg (10 mg Intravenous Given 02/03/22 2216)  iohexol (OMNIPAQUE) 350 MG/ML injection 100 mL (100 mLs Intravenous Contrast Given 02/03/22 2320)    ED Course/ Medical Decision Making/ A&P                           Medical Decision Making Amount and/or Complexity of Data Reviewed Radiology: ordered.  Risk Prescription drug management.   41 year old lady presenting to ER due to concern for severe chest pain, back pain.  Also endorsed headache.  When I evaluated patient patient appears markedly uncomfortable from her pain.   Vitals noted mild hypertension but otherwise stable.  Broad work-up obtained and considered.  Given severity of pain and radiation to back, check CTA to rule out dissection.  Her EKG is without any acute ischemic change.  Will check troponin.  Patient's pain improved considerably with dose of Dilaudid.  While awaiting CT results, repeat troponin, signed out to Dr. Florina Ou.        Final Clinical Impression(s) / ED Diagnoses Final diagnoses:  Atypical chest pain    Rx / DC Orders ED Discharge Orders     None         Lucrezia Starch, MD 02/04/22 1637

## 2022-02-03 NOTE — ED Provider Triage Note (Signed)
Emergency Medicine Provider Triage Evaluation Note  Maria Morris , a 41 y.o. female  was evaluated in triage.  Pt complains of chest pain and migraine.  Patient reports chest pain began 3 days ago, located on right side of chest, does not radiate, not associated with shortness of breath.  Patient states that she thought it was gas, has been taking Tums at home without relief.  Patient rates pain 10 out of 10 in triage.  Patient denies history of blood clots.  Patient states that she had calf cramping yesterday in left calf.  Patient endorsing nausea without vomiting.  Patient states she is having subjective fevers at home.  No focal neurodeficit on examination.  The patient lung sounds are clear bilaterally.  Review of Systems  Positive:  Negative:   Physical Exam  BP (!) 158/92 (BP Location: Left Arm)   Pulse 92   Temp 98.1 F (36.7 C) (Oral)   Resp 17   SpO2 98%  Gen:   Awake, no distress   Resp:  Normal effort  MSK:   Moves extremities without difficulty  Other:    Medical Decision Making  Medically screening exam initiated at 9:29 PM.  Appropriate orders placed.  Orly Quimby was informed that the remainder of the evaluation will be completed by another provider, this initial triage assessment does not replace that evaluation, and the importance of remaining in the ED until their evaluation is complete.     Al Decant, PA-C 02/03/22 2130

## 2022-02-03 NOTE — ED Provider Notes (Signed)
Nursing notes and vitals signs, including pulse oximetry, reviewed.  Summary of this visit's results, reviewed by myself:  EKG:  EKG Interpretation  Date/Time:  Wednesday February 03 2022 21:24:06 EDT Ventricular Rate:  88 PR Interval:  143 QRS Duration: 104 QT Interval:  384 QTC Calculation: 465 R Axis:   99 Text Interpretation: Sinus rhythm Borderline right axis deviation Confirmed by Marianna Fussykstra, Richard ([id number]) on 02/03/2022 10:03:31 PM        Labs:  Results for orders placed or performed during the hospital encounter of 02/03/22 (from the past 24 hour(s))  Basic metabolic panel     Status: Abnormal   Collection Time: 02/03/22  9:31 PM  Result Value Ref Range   Sodium 137 135 - 145 mmol/L   Potassium 3.4 (L) 3.5 - 5.1 mmol/L   Chloride 102 98 - 111 mmol/L   CO2 22 22 - 32 mmol/L   Glucose, Bld 178 (H) 70 - 99 mg/dL   BUN 16 6 - 20 mg/dL   Creatinine, Ser 4.090.88 0.44 - 1.00 mg/dL   Calcium 9.6 8.9 - 81.110.3 mg/dL   GFR, Estimated >91>60 >47>60 mL/min   Anion gap 13 5 - 15  CBC     Status: Abnormal   Collection Time: 02/03/22  9:31 PM  Result Value Ref Range   WBC 11.2 (H) 4.0 - 10.5 K/uL   RBC 4.70 3.87 - 5.11 MIL/uL   Hemoglobin 12.9 12.0 - 15.0 g/dL   HCT 82.938.8 56.236.0 - 13.046.0 %   MCV 82.6 80.0 - 100.0 fL   MCH 27.4 26.0 - 34.0 pg   MCHC 33.2 30.0 - 36.0 g/dL   RDW 86.515.1 78.411.5 - 69.615.5 %   Platelets 233 150 - 400 K/uL   nRBC 0.0 0.0 - 0.2 %  Troponin I (High Sensitivity)     Status: None   Collection Time: 02/03/22  9:31 PM  Result Value Ref Range   Troponin I (High Sensitivity) 3 <18 ng/L  POC CBG, ED     Status: Abnormal   Collection Time: 02/03/22 10:12 PM  Result Value Ref Range   Glucose-Capillary 196 (H) 70 - 99 mg/dL  I-Stat Beta hCG blood, ED ([facility], [facility], [facility] only)     Status: None   Collection Time: 02/03/22 10:47 PM  Result Value Ref Range   I-stat hCG, quantitative <5.0 <5 mIU/mL   Comment 3          Troponin I (High Sensitivity)     Status: None   Collection Time:  02/03/22 11:31 PM  Result Value Ref Range   Troponin I (High Sensitivity) 2 <18 ng/L    Imaging Studies: CT Angio Chest/Abd/Pel for Dissection W and/or Wo Contrast  Result Date: 02/03/2022 CLINICAL DATA:  Chest and back pain.  Concern for aortic dissection. EXAM: CT ANGIOGRAPHY CHEST, ABDOMEN AND PELVIS TECHNIQUE: Non-contrast CT of the chest was initially obtained. Multidetector CT imaging through the chest, abdomen and pelvis was performed using the standard protocol during bolus administration of intravenous contrast. Multiplanar reconstructed images and MIPs were obtained and reviewed to evaluate the vascular anatomy. RADIATION DOSE REDUCTION: This exam was performed according to the departmental dose-optimization program which includes automated exposure control, adjustment of the mA and/or kV according to patient size and/or use of iterative reconstruction technique. CONTRAST:  100mL OMNIPAQUE IOHEXOL 350 MG/ML SOLN COMPARISON:  Chest CT dated 03/10/2020. FINDINGS: CTA CHEST FINDINGS Cardiovascular: There is no cardiomegaly or pericardial effusion. The thoracic aorta is unremarkable. The origins of  the great vessels of the aortic arch and the central pulmonary arteries are patent. Mediastinum/Nodes: No hilar or mediastinal adenopathy. The esophagus and the thyroid gland are grossly unremarkable. No mediastinal fluid collection. Lungs/Pleura: The lungs are clear. There is no pleural effusion pneumothorax. The central airways are patent. Musculoskeletal: No acute osseous pathology. Review of the MIP images confirms the above findings. CTA ABDOMEN AND PELVIS FINDINGS VASCULAR Aorta: Normal caliber aorta without aneurysm, dissection, vasculitis or significant stenosis. Celiac: Patent without evidence of aneurysm, dissection, vasculitis or significant stenosis. SMA: Patent without evidence of aneurysm, dissection, vasculitis or significant stenosis. Renals: Both renal arteries are patent without evidence of  aneurysm, dissection, vasculitis, fibromuscular dysplasia or significant stenosis. IMA: Patent without evidence of aneurysm, dissection, vasculitis or significant stenosis. Inflow: Patent without evidence of aneurysm, dissection, vasculitis or significant stenosis. Veins: No obvious venous abnormality within the limitations of this arterial phase study. Review of the MIP images confirms the above findings. NON-VASCULAR No intra-abdominal free air or free fluid. Hepatobiliary: Fatty liver. No intrahepatic biliary dilatation. Cholecystectomy. Pancreas: Unremarkable. No pancreatic ductal dilatation or surrounding inflammatory changes. Spleen: Small calcific granulomas. The spleen is otherwise unremarkable. Adrenals/Urinary Tract: The adrenal glands are unremarkable. The kidneys, visualized ureters, and urinary bladder appear unremarkable. Stomach/Bowel: There is sigmoid diverticulosis without active inflammatory changes. There is no bowel obstruction or active inflammation. The appendix is normal. Lymphatic: No adenopathy. Reproductive: The uterus is anteverted and grossly unremarkable. A 6.2 x 5.2 cm fatty lesion with a focus of calcification in the posterior pelvis most consistent with a teratoma. Other: None Musculoskeletal: No acute or significant osseous findings. Review of the MIP images confirms the above findings. IMPRESSION: 1. No acute intrathoracic, abdominal, or pelvic pathology. No aortic aneurysm or dissection. 2. Sigmoid diverticulosis without active inflammatory changes. No bowel obstruction. Normal appendix. 3. Fatty liver. 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 Electronically Signed   By: Elgie Collard M.D.   On: 02/03/2022 23:40   CT Head Wo Contrast  Result Date: 02/03/2022 CLINICAL DATA:  Headache, sudden, severe EXAM: CT HEAD WITHOUT CONTRAST TECHNIQUE: Contiguous axial images were obtained from the base of the skull through the vertex without intravenous contrast. RADIATION  DOSE REDUCTION: This exam was performed according to the departmental dose-optimization program which includes automated exposure control, adjustment of the mA and/or kV according to patient size and/or use of iterative reconstruction technique. COMPARISON:  None Available. FINDINGS: Brain: Normal anatomic configuration. No abnormal intra or extra-axial mass lesion or fluid collection. No abnormal mass effect or midline shift. No evidence of acute intracranial hemorrhage or infarct. Ventricular size is normal. Cerebellum unremarkable. Vascular: Unremarkable Skull: Intact Sinuses/Orbits: Paranasal sinuses are clear. Orbits are unremarkable. Other: Mastoid air cells and middle ear cavities are clear. IMPRESSION: No acute intracranial abnormality. Electronically Signed   By: Helyn Numbers M.D.   On: 02/03/2022 23:36   DG Chest Port 1 View  Result Date: 02/03/2022 CLINICAL DATA:  Mid chest pain. EXAM: PORTABLE CHEST 1 VIEW COMPARISON:  Chest CTA 03/10/2020. FINDINGS: Exam technically limited by body habitus and low inspiration. There are hazy opacities in the hypoinflated lung bases which could be due to atelectasis or pneumonia. The mid to upper lung fields are clear. No pleural effusion is seen mild elevation of the right hemidiaphragm. There is mild cardiomegaly. The mediastinum is normally outlined. The central vessels appear to be normal in caliber. IMPRESSION: Limited expiratory study. There is hazy atelectasis or pneumonitis in the bases. PA  and lateral study in full inspiration recommended. Electronically Signed   By: Almira Bar M.D.   On: 02/03/2022 22:03       Michaelanthony Kempton, Jonny Ruiz, MD 02/04/22 0011

## 2022-02-03 NOTE — ED Triage Notes (Signed)
  Patient comes in with chest pain that has been on and off for 3 days.  Patient thought it was gas and treated at home.  Today around 2100 patient had sharp stabbing pain in middle of chest while sitting on couch.  Patient became nauseous and vomited.  Patient states bilateral hands became tingly.  Pain 10/10.

## 2022-02-04 LAB — TROPONIN I (HIGH SENSITIVITY): Troponin I (High Sensitivity): 2 ng/L (ref <18)

## 2022-02-24 ENCOUNTER — Ambulatory Visit: Payer: Managed Care, Other (non HMO) | Admitting: Podiatry

## 2022-03-11 ENCOUNTER — Ambulatory Visit: Payer: Managed Care, Other (non HMO) | Admitting: Podiatry

## 2022-06-07 ENCOUNTER — Ambulatory Visit (INDEPENDENT_AMBULATORY_CARE_PROVIDER_SITE_OTHER): Payer: 59 | Admitting: Podiatry

## 2022-06-07 ENCOUNTER — Encounter: Payer: Self-pay | Admitting: Podiatry

## 2022-06-07 ENCOUNTER — Ambulatory Visit (INDEPENDENT_AMBULATORY_CARE_PROVIDER_SITE_OTHER): Payer: 59

## 2022-06-07 DIAGNOSIS — M7751 Other enthesopathy of right foot: Secondary | ICD-10-CM

## 2022-06-07 DIAGNOSIS — M7752 Other enthesopathy of left foot: Secondary | ICD-10-CM | POA: Diagnosis not present

## 2022-06-07 DIAGNOSIS — E1142 Type 2 diabetes mellitus with diabetic polyneuropathy: Secondary | ICD-10-CM

## 2022-06-07 MED ORDER — TRIAMCINOLONE ACETONIDE 10 MG/ML IJ SUSP
20.0000 mg | Freq: Once | INTRAMUSCULAR | Status: AC
  Administered 2022-06-07: 20 mg

## 2022-06-09 NOTE — Progress Notes (Signed)
Subjective:   Patient ID: Maria Morris, female   DOB: 41 y.o.   MRN: [id number]   HPI Patient presents with pain in her feet bilateral with pain in the ankle region bilateral and also obesity which is a complicating factor.  Patient states she was taking gabapentin she stopped has started again just recently   ROS      Objective:  Physical Exam  Vascular status intact neurologically she has diminishment of sharp dull vibratory she has diabetes not in good control with A1c around 9 with history of amputation and she has inflammation in the sinus tarsi bilateral with fluid buildup and pain with palpation.     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  At this point I did sterile prep I injected the sinus tarsi bilateral 3 mg Kenalog 5 mg Xylocaine applied sterile dressing hopefully this will help her problem a little bit and she will continue with her gabapentin

## 2022-06-23 ENCOUNTER — Telehealth: Payer: Self-pay

## 2022-07-12 NOTE — Telephone Encounter (Signed)
error 

## 2022-07-28 ENCOUNTER — Other Ambulatory Visit: Payer: 59

## 2022-08-10 ENCOUNTER — Other Ambulatory Visit: Payer: 59

## 2022-08-10 ENCOUNTER — Telehealth: Payer: Self-pay | Admitting: Podiatry

## 2022-08-10 NOTE — Telephone Encounter (Signed)
-----   Message from [place], [place] sent at 08/10/2022  8:56 AM EST ----- Regarding: Quten[facility] Hello Dawn, Patient scheduled a Qutenza treatment appointment today. But benefits were terminated. Could you please call pt to cancel appointment. If she has another insurance she will need to provide so I can see if she can get approved.    Also Dr. Charlsie Merles is not here today. Future reference for her the ordering provider must be present.

## 2022-08-10 NOTE — Telephone Encounter (Signed)
Spoke to pts husband and he said pt was not coming in for the appt. I did explain to him that to r/s the appt she would need to call into the office to schedule the Qutenza treatment as the provider has to be in the office and we would need an updated insurance information to schedule the appt. She had scheduled it from [facility].

## 2023-03-04 ENCOUNTER — Other Ambulatory Visit: Payer: Self-pay

## 2023-03-04 ENCOUNTER — Emergency Department (HOSPITAL_COMMUNITY)
Admission: EM | Admit: 2023-03-04 | Discharge: 2023-03-05 | Disposition: A | Discharge status: Home / Self Care | Payer: Medicaid Other | Attending: Emergency Medicine | Admitting: Emergency Medicine

## 2023-03-04 ENCOUNTER — Emergency Department (HOSPITAL_COMMUNITY): Payer: Medicaid Other

## 2023-03-04 ENCOUNTER — Encounter (HOSPITAL_COMMUNITY): Payer: Self-pay

## 2023-03-04 DIAGNOSIS — Z794 Long term (current) use of insulin: Secondary | ICD-10-CM | POA: Diagnosis not present

## 2023-03-04 DIAGNOSIS — T50905A Adverse effect of unspecified drugs, medicaments and biological substances, initial encounter: Secondary | ICD-10-CM | POA: Insufficient documentation

## 2023-03-04 DIAGNOSIS — Z87891 Personal history of nicotine dependence: Secondary | ICD-10-CM | POA: Diagnosis not present

## 2023-03-04 DIAGNOSIS — R112 Nausea with vomiting, unspecified: Secondary | ICD-10-CM | POA: Diagnosis not present

## 2023-03-04 DIAGNOSIS — R5381 Other malaise: Secondary | ICD-10-CM | POA: Insufficient documentation

## 2023-03-04 DIAGNOSIS — R1084 Generalized abdominal pain: Secondary | ICD-10-CM | POA: Diagnosis not present

## 2023-03-04 DIAGNOSIS — N189 Chronic kidney disease, unspecified: Secondary | ICD-10-CM | POA: Diagnosis not present

## 2023-03-04 DIAGNOSIS — E1142 Type 2 diabetes mellitus with diabetic polyneuropathy: Secondary | ICD-10-CM | POA: Diagnosis not present

## 2023-03-04 DIAGNOSIS — R079 Chest pain, unspecified: Secondary | ICD-10-CM | POA: Insufficient documentation

## 2023-03-04 DIAGNOSIS — Z5986 Financial insecurity: Secondary | ICD-10-CM | POA: Insufficient documentation

## 2023-03-04 DIAGNOSIS — Z7984 Long term (current) use of oral hypoglycemic drugs: Secondary | ICD-10-CM | POA: Diagnosis not present

## 2023-03-04 DIAGNOSIS — R110 Nausea: Secondary | ICD-10-CM

## 2023-03-04 DIAGNOSIS — I129 Hypertensive chronic kidney disease with stage 1 through stage 4 chronic kidney disease, or unspecified chronic kidney disease: Secondary | ICD-10-CM | POA: Diagnosis not present

## 2023-03-04 DIAGNOSIS — R519 Headache, unspecified: Secondary | ICD-10-CM | POA: Diagnosis not present

## 2023-03-04 DIAGNOSIS — I10 Essential (primary) hypertension: Secondary | ICD-10-CM | POA: Insufficient documentation

## 2023-03-04 DIAGNOSIS — E1122 Type 2 diabetes mellitus with diabetic chronic kidney disease: Secondary | ICD-10-CM | POA: Diagnosis not present

## 2023-03-04 LAB — BASIC METABOLIC PANEL
Anion gap: 17 — ABNORMAL HIGH (ref 5–15)
BUN: 6 mg/dL (ref 6–20)
CO2: 20 mmol/L — ABNORMAL LOW (ref 22–32)
Calcium: 10.1 mg/dL (ref 8.9–10.3)
Chloride: 102 mmol/L (ref 98–111)
Creatinine, Ser: 1.13 mg/dL — ABNORMAL HIGH (ref 0.44–1.00)
GFR, Estimated: >60 mL/min (ref >60)
Glucose, Bld: 238 mg/dL — ABNORMAL HIGH (ref 70–99)
Potassium: 3.6 mmol/L (ref 3.5–5.1)
Sodium: 139 mmol/L (ref 135–145)

## 2023-03-04 LAB — CBC
HCT: 44.5 % (ref 36.0–46.0)
Hemoglobin: 14.9 g/dL (ref 12.0–15.0)
MCH: 28.5 pg (ref 26.0–34.0)
MCHC: 33.5 g/dL (ref 30.0–36.0)
MCV: 85.2 fL (ref 80.0–100.0)
Platelets: 251 10*3/uL (ref 150–400)
RBC: 5.22 MIL/uL — ABNORMAL HIGH (ref 3.87–5.11)
RDW: 13.6 % (ref 11.5–15.5)
WBC: 10.8 10*3/uL — ABNORMAL HIGH (ref 4.0–10.5)
nRBC: 0 % (ref 0.0–0.2)

## 2023-03-04 LAB — CBG MONITORING, ED: Glucose-Capillary: 246 mg/dL — ABNORMAL HIGH (ref 70–99)

## 2023-03-04 LAB — TROPONIN I (HIGH SENSITIVITY): Troponin I (High Sensitivity): 4 ng/L (ref <18)

## 2023-03-04 LAB — HCG, SERUM, QUALITATIVE: Preg, Serum: NEGATIVE

## 2023-03-04 MED ORDER — ONDANSETRON 4 MG PO TBDP
4.0000 mg | ORAL_TABLET | Freq: Once | ORAL | Status: AC | PRN
  Administered 2023-03-04: 4 mg via ORAL
  Filled 2023-03-04: qty 1

## 2023-03-04 NOTE — ED Notes (Signed)
The triage note and the complete triage was made by chris Messiah Ahr rn not lane nt

## 2023-03-04 NOTE — ED Triage Notes (Signed)
The pt is breathing approx 60 times a minute  she has carpo-pedal spasms  she reports that she has panic attacks and at home  her heart was beating fast and she has been cautioned to slow her breathing.  She also has a HEADACHE  SHE TAKES MED FOR MIGRAINE HEADACHES  she took that meds   lmp last month

## 2023-03-05 ENCOUNTER — Emergency Department (HOSPITAL_COMMUNITY)
Admission: EM | Admit: 2023-03-05 | Discharge: 2023-03-05 | Disposition: A | Discharge status: Home / Self Care | Payer: Medicaid Other | Source: Home / Self Care | Attending: Emergency Medicine | Admitting: Emergency Medicine

## 2023-03-05 ENCOUNTER — Other Ambulatory Visit: Payer: Self-pay

## 2023-03-05 ENCOUNTER — Encounter (HOSPITAL_COMMUNITY): Payer: Self-pay

## 2023-03-05 ENCOUNTER — Emergency Department (HOSPITAL_COMMUNITY): Payer: Medicaid Other

## 2023-03-05 DIAGNOSIS — Z7984 Long term (current) use of oral hypoglycemic drugs: Secondary | ICD-10-CM | POA: Insufficient documentation

## 2023-03-05 DIAGNOSIS — T50905A Adverse effect of unspecified drugs, medicaments and biological substances, initial encounter: Secondary | ICD-10-CM

## 2023-03-05 DIAGNOSIS — R1084 Generalized abdominal pain: Secondary | ICD-10-CM | POA: Insufficient documentation

## 2023-03-05 DIAGNOSIS — E1122 Type 2 diabetes mellitus with diabetic chronic kidney disease: Secondary | ICD-10-CM | POA: Insufficient documentation

## 2023-03-05 DIAGNOSIS — Z794 Long term (current) use of insulin: Secondary | ICD-10-CM | POA: Insufficient documentation

## 2023-03-05 DIAGNOSIS — N189 Chronic kidney disease, unspecified: Secondary | ICD-10-CM | POA: Insufficient documentation

## 2023-03-05 LAB — CBC WITH DIFFERENTIAL/PLATELET
Abs Immature Granulocytes: 0.04 10*3/uL (ref 0.00–0.07)
Basophils Absolute: 0.1 10*3/uL (ref 0.0–0.1)
Basophils Relative: 1 %
Eosinophils Absolute: 0 10*3/uL (ref 0.0–0.5)
Eosinophils Relative: 0 %
HCT: 43.7 % (ref 36.0–46.0)
Hemoglobin: 14.2 g/dL (ref 12.0–15.0)
Immature Granulocytes: 1 %
Lymphocytes Relative: 23 %
Lymphs Abs: 2 10*3/uL (ref 0.7–4.0)
MCH: 28.7 pg (ref 26.0–34.0)
MCHC: 32.5 g/dL (ref 30.0–36.0)
MCV: 88.5 fL (ref 80.0–100.0)
Monocytes Absolute: 0.6 10*3/uL (ref 0.1–1.0)
Monocytes Relative: 7 %
Neutro Abs: 6.1 10*3/uL (ref 1.7–7.7)
Neutrophils Relative %: 68 %
Platelets: 212 10*3/uL (ref 150–400)
RBC: 4.94 MIL/uL (ref 3.87–5.11)
RDW: 13.9 % (ref 11.5–15.5)
WBC: 8.8 10*3/uL (ref 4.0–10.5)
nRBC: 0 % (ref 0.0–0.2)

## 2023-03-05 LAB — LIPASE, BLOOD: Lipase: 40 U/L (ref 11–51)

## 2023-03-05 LAB — COMPREHENSIVE METABOLIC PANEL
ALT: 19 U/L (ref 0–44)
AST: 21 U/L (ref 15–41)
Albumin: 3.4 g/dL — ABNORMAL LOW (ref 3.5–5.0)
Alkaline Phosphatase: 64 U/L (ref 38–126)
Anion gap: 13 (ref 5–15)
BUN: 8 mg/dL (ref 6–20)
CO2: 17 mmol/L — ABNORMAL LOW (ref 22–32)
Calcium: 9.3 mg/dL (ref 8.9–10.3)
Chloride: 106 mmol/L (ref 98–111)
Creatinine, Ser: 0.92 mg/dL (ref 0.44–1.00)
GFR, Estimated: >60 mL/min (ref >60)
Glucose, Bld: 251 mg/dL — ABNORMAL HIGH (ref 70–99)
Potassium: 3.6 mmol/L (ref 3.5–5.1)
Sodium: 136 mmol/L (ref 135–145)
Total Bilirubin: 1 mg/dL (ref 0.3–1.2)
Total Protein: 6.5 g/dL (ref 6.5–8.1)

## 2023-03-05 LAB — TROPONIN I (HIGH SENSITIVITY)
Troponin I (High Sensitivity): 4 ng/L (ref <18)
Troponin I (High Sensitivity): 6 ng/L (ref <18)

## 2023-03-05 MED ORDER — KETOROLAC TROMETHAMINE 15 MG/ML IJ SOLN
15.0000 mg | Freq: Once | INTRAMUSCULAR | Status: AC
  Administered 2023-03-05: 15 mg via INTRAVENOUS
  Filled 2023-03-05: qty 1

## 2023-03-05 MED ORDER — HALOPERIDOL LACTATE 5 MG/ML IJ SOLN
5.0000 mg | Freq: Once | INTRAMUSCULAR | Status: AC
  Administered 2023-03-05: 5 mg via INTRAVENOUS
  Filled 2023-03-05: qty 1

## 2023-03-05 MED ORDER — DROPERIDOL 2.5 MG/ML IJ SOLN
2.5000 mg | Freq: Once | INTRAMUSCULAR | Status: AC
  Administered 2023-03-05: 2.5 mg via INTRAVENOUS
  Filled 2023-03-05: qty 2

## 2023-03-05 MED ORDER — PANTOPRAZOLE SODIUM 40 MG IV SOLR
40.0000 mg | Freq: Once | INTRAVENOUS | Status: AC
  Administered 2023-03-05: 40 mg via INTRAVENOUS
  Filled 2023-03-05: qty 10

## 2023-03-05 MED ORDER — SODIUM CHLORIDE 0.9 % IV BOLUS
1000.0000 mL | Freq: Once | INTRAVENOUS | Status: AC
  Administered 2023-03-05: 1000 mL via INTRAVENOUS

## 2023-03-05 MED ORDER — PROCHLORPERAZINE MALEATE 10 MG PO TABS: 10.0000 mg | ORAL_TABLET | Freq: Two times a day (BID) | ORAL | 0 refills | Status: AC | PRN

## 2023-03-05 MED ORDER — PROCHLORPERAZINE MALEATE 10 MG PO TABS: 10.0000 mg | ORAL_TABLET | Freq: Two times a day (BID) | ORAL | 0 refills | Status: DC | PRN

## 2023-03-05 MED ORDER — ALUM & MAG HYDROXIDE-SIMETH 200-200-20 MG/5ML PO SUSP
30.0000 mL | Freq: Once | ORAL | Status: AC
  Administered 2023-03-05: 30 mL via ORAL
  Filled 2023-03-05 (×2): qty 30

## 2023-03-05 MED ORDER — DIPHENHYDRAMINE HCL 50 MG/ML IJ SOLN
50.0000 mg | Freq: Once | INTRAMUSCULAR | Status: DC
  Filled 2023-03-05: qty 1

## 2023-03-05 NOTE — Discharge Instructions (Signed)
We suspect that your symptoms are due to "stomach paralysis" or gastroparesis due to Ozempic use.  Please call your primary care doctor to discuss the medication and side effects.

## 2023-03-05 NOTE — ED Notes (Signed)
Patient noted to be hypoxic, Dr Rhunette Croft made aware. Patient placed on 2L [place].

## 2023-03-05 NOTE — ED Notes (Addendum)
Patient ambulated to restroom with minimal assistance, RN held patient's arm while she walked. Patient provided urine specimen. Patient tearful throughout encounter but able to speak in complete sentences. Patient reports pain is worse now than when she was here last night.

## 2023-03-05 NOTE — Discharge Instructions (Signed)
You were evaluated in the Emergency Department and after careful evaluation, we did not find any emergent condition requiring admission or further testing in the hospital.  Your exam/testing today is overall reassuring.  Symptoms may be due to gastroparesis.  Recommend follow-up with your primary care doctor to discuss her symptoms.  Use the Compazine medication at home as needed for any nausea.  Please return to the Emergency Department if you experience any worsening of your condition.   Thank you for allowing [place] to be a part of your care.

## 2023-03-05 NOTE — ED Provider Notes (Signed)
[facility]EM[facility]ENCY DEPT [facility] Emergency Department Provider Note MRN:  [id number]  Arrival date & time: 03/05/23     Chief Complaint   Hyperventilating   History of Present Illness   Maria Morris is a 42 y.o. year-old female with a history of diabetes presenting to the ED with chief complaint of hyperventilating.  Patient feeling generalized malaise and generalized body pain with headache, persistent nausea vomiting since 1 PM yesterday.  She is feeling very tired.  Review of Systems  A thorough review of systems was obtained and all systems are negative except as noted in the HPI and PMH.   Patient's Health History    Past Medical History:  Diagnosis Date   Anemia    Anxiety    Diabetic neuropathy (HCC)    Gallstones    GERD (gastroesophageal reflux disease)    Hypertension    Migraines    Peripheral neuropathy    Tachycardia    Type 2 diabetes mellitus (HCC) 2014    Past Surgical History:  Procedure Laterality Date   AMPUTATION TOE Left 03/08/2020   Procedure: AMPUTATION 5th TOE;  Surgeon: Asencion Islam, DPM;  Location: [facility] OR;  Service: Podiatry;  Laterality: Left;   AMPUTATION TOE Left 08/08/2021   Procedure: AMPUTATION TOE;  Surgeon: Asencion Islam, DPM;  Location: [facility] OR;  Service: Podiatry;  Laterality: Left;  4th toe   CHOLECYSTECTOMY N/A 10/16/2019   Procedure: LAPAROSCOPIC CHOLECYSTECTOMY;  Surgeon: Kinsinger, De Blanch, MD;  Location: [facility] ORS;  Service: General;  Laterality: N/A;   NO PAST SURGERIES      Family History  Problem Relation Age of Onset   Diabetes Mother    Diabetes Father    Hypertension Father    Heart disease Father    Breast cancer Paternal Aunt    Cancer Neg Hx     Social History   Socioeconomic History   Marital status: Married    Spouse name: Trisa Byrley   Number of children: 0   Years of education: Not on file   Highest education level: 12th grade  Occupational History   Not on file  Tobacco Use   Smoking  status: Former    Current packs/day: 0.00    Types: Cigarettes    Quit date: 02/2019    Years since quitting: 4.0   Smokeless tobacco: Never  Vaping Use   Vaping status: Never Used  Substance and Sexual Activity   Alcohol use: Yes    Comment: occ   Drug use: No   Sexual activity: Yes    Birth control/protection: None  Other Topics Concern   Not on file  Social History Narrative   Right Handed    Lives in a one story home    Social Determinants of Health   Financial Resource Strain: High Risk (09/09/2021)   Received from [facility], [facility]   Overall Financial Resource Strain (CARDIA)    Difficulty of Paying Living Expenses: Very hard  Food Insecurity: Food Insecurity Present (09/09/2021)   Received from [facility], [facility]   Hunger Vital Sign    Worried About Running Out of Food in the Last Year: Often true    Ran Out of Food in the Last Year: Often true  Transportation Needs: No Transportation Needs (09/09/2021)   Received from [employer], [facility]   PRAPARE - Transportation    Lack of Transportation (Medical): No    Lack of Transportation (Non-Medical): No  Physical Activity: Insufficiently Active (09/09/2021)  Received from [facility], [facility]   Exercise Vital Sign    Days of Exercise per Week: 4 days    Minutes of Exercise per Session: 20 min  Stress: Stress Concern Present (09/09/2021)   Received from [facility], [facility] Occupational [facility] - Occupational Stress Questionnaire    Feeling of Stress : Rather much  Social Connections: Unknown (12/20/2021)   Received from [facility], [facility]   Social Network    Social Network: Not on file  Intimate Partner Violence: Unknown (11/23/2021)   Received from [facility], [facility]   HITS    Physically Hurt: Not on file    Insult or Talk Down To: Not on file    Threaten Physical Harm: Not on file    Scream or Curse: Not on file     Physical  Exam   Vitals:   03/05/23 0022 03/05/23 0059  BP: 123/75 (!) 124/99  Pulse: 85 (!) 107  Resp: 20 20  Temp: 97.9 F (36.6 C)   SpO2: 100% 100%    CONSTITUTIONAL: Chronically ill-appearing, NAD NEURO/PSYCH:  Alert and oriented x 3, normal and symmetric strength and sensation, normal coordination, normal speech EYES:  eyes equal and reactive ENT/NECK:  no LAD, no JVD CARDIO: Regular rate, well-perfused, normal S1 and S2 PULM:  CTAB no wheezing or rhonchi GI/GU:  non-distended, non-tender MSK/SPINE:  No gross deformities, no edema SKIN:  no rash, atraumatic   *Additional and/or pertinent findings included in MDM below  Diagnostic and Interventional Summary    EKG Interpretation Date/Time:  Saturday March 05 2023 00:40:24 EDT Ventricular Rate:  99 PR Interval:  166 QRS Duration:  96 QT Interval:  384 QTC Calculation: 492 R Axis:   95  Text Interpretation: Normal sinus rhythm Rightward axis Nonspecific T wave abnormality Abnormal ECG When compared with ECG of 04-Mar-2023 20:40, PREVIOUS ECG IS PRESENT Confirmed by Kennis Carina [phone]) on 03/05/2023 1:38:21 AM       Labs Reviewed  BASIC METABOLIC PANEL - Abnormal; Notable for the following components:      Result Value   CO2 20 (*)    Glucose, Bld 238 (*)    Creatinine, Ser 1.13 (*)    Anion gap 17 (*)    All other components within normal limits  CBC - Abnormal; Notable for the following components:   WBC 10.8 (*)    RBC 5.22 (*)    All other components within normal limits  CBG MONITORING, ED - Abnormal; Notable for the following components:   Glucose-Capillary 246 (*)    All other components within normal limits  HCG, SERUM, QUALITATIVE  TROPONIN I (HIGH SENSITIVITY)  TROPONIN I (HIGH SENSITIVITY)    DG Chest 2 View  Final Result      Medications  diphenhydrAMINE (BENADRYL) injection 50 mg (has no administration in time range)  ondansetron (ZOFRAN-ODT) disintegrating tablet 4 mg (4 mg Oral Given 03/04/23  2357)  ketorolac (TORADOL) 15 MG/ML injection 15 mg (15 mg Intravenous Given 03/05/23 0220)  sodium chloride 0.9 % bolus 1,000 mL (1,000 mLs Intravenous New Bag/Given 03/05/23 0219)  droperidol (INAPSINE) 2.5 MG/ML injection 2.5 mg (2.5 mg Intravenous Given 03/05/23 0223)     Procedures  /  Critical Care Procedures  ED Course and Medical Decision Making  Initial Impression and Ddx Question gastroparesis.  Patient seems a bit anxious, still quite nauseated.  Overall low concern for significant CNS process with regard to the headache.  Abdomen soft and nontender, reassuring hemodynamics.  Will attempt symptomatic management and reassess need for advanced imaging.  Past medical/surgical history that increases complexity of ED encounter: Diabetes  Interpretation of Diagnostics I personally reviewed the EKG and my interpretation is as follows: Sinus rhythm  Labs overall reassuring with no significant blood count or electrolyte disturbance, troponin negative x 2.  Patient Reassessment and Ultimate Disposition/Management     Patient explains that her symptoms are completely resolved after droperidol.  Abdomen soft nontender, no chest pain, no headache, continued reassuring neurological exam.  Suspect gastroparesis, appropriate for discharge.  Patient management required discussion with the following services or consulting groups:  None  Complexity of Problems Addressed Acute illness or injury that poses threat of life of bodily function  Additional Data Reviewed and Analyzed Further history obtained from: Further history from spouse/family member  Additional Factors Impacting ED Encounter Risk Prescriptions  Elmer Sow. Pilar Plate, MD [facility] Emergency Medicine [facility] [email or web address]  Final Clinical Impressions(s) / ED Diagnoses     ICD-10-CM   1. Nausea  R11.0     2. Chest pain, unspecified type  R07.9       ED Discharge Orders          Ordered     prochlorperazine (COMPAZINE) 10 MG tablet  2 times daily PRN        03/05/23 0303             Discharge Instructions Discussed with and Provided to Patient:     Discharge Instructions      You were evaluated in the Emergency Department and after careful evaluation, we did not find any emergent condition requiring admission or further testing in the hospital.  Your exam/testing today is overall reassuring.  Symptoms may be due to gastroparesis.  Recommend follow-up with your primary care doctor to discuss her symptoms.  Use the Compazine medication at home as needed for any nausea.  Please return to the Emergency Department if you experience any worsening of your condition.   Thank you for allowing [place] to be a part of your care.       Sabas Sous, MD 03/05/23 [phone]

## 2023-03-05 NOTE — ED Provider Notes (Signed)
EMERGENCY DEPARTMENT AT [facility] Provider Note   CSN: [id number] Arrival date & time: 03/05/23  1227     History  Chief Complaint  Patient presents with   Abdominal Pain    Maria Morris is a 42 y.o. female.  HPI    42 year old female comes in with chief complaint of abdominal pain. Patient has history of diabetes, CKD, obesity currently on Ozempic -dose of which was increased this last week.  She comes into the ER today with epigastric and generalized abdominal pain.  Patient was seen yesterday with chest pain.  Patient states that this morning she started noticing gradually worsening abdominal pain.  The pain is in the upper quadrants.  She is status post cholecystectomy.  Pain is constant, throbbing and severe.  She has had associated nausea with emesis x 2.  She did have a bowel movement today which was loose.  She denies any blood in the stool or vomitus.    Home Medications Prior to Admission medications   Medication Sig Start Date End Date Taking? Authorizing Provider  atorvastatin (LIPITOR) 20 MG tablet Take 20 mg by mouth daily. 07/30/13   [provider]  Blood Glucose Monitoring Suppl (TRUE METRIX METER) w/Device KIT 1 each by Does not apply route 2 (two) times a day. 03/22/19   Anders Simmonds, PA-C  buPROPion (WELLBUTRIN XL) 150 MG 24 hr tablet Take 150 mg by mouth every morning. 09/07/21   [provider]  Continuous Blood Gluc Receiver (DEXCOM G6 RECEIVER) DEVI  09/10/20   [provider]  Continuous Blood Gluc Sensor (DEXCOM G6 SENSOR) MISC Inject 1 Device into the skin every 14 (fourteen) days. 03/04/20   [provider]  Continuous Blood Gluc Transmit (DEXCOM G6 TRANSMITTER) MISC See admin instructions. 03/11/20   [provider]  DULoxetine (CYMBALTA) 30 MG capsule Take 1 capsule (30 mg total) by mouth daily. 09/25/21   Patel, Roxana Hires K, DO  gabapentin (NEURONTIN) 800 MG tablet Take 1 tablet  (800 mg total) by mouth in the morning, at noon, and at bedtime. 07/30/21   Stover, Cassandria Anger, DPM  glucose blood (TRUE METRIX BLOOD GLUCOSE TEST) test strip Use as instructed Patient taking differently: 1 each by Other route as directed. 03/22/19   Anders Simmonds, PA-C  Insulin Pen Needle (TRUEPLUS PEN NEEDLES) 32G X 4 MM MISC Use as instructed. Patient taking differently: 1 each by Other route as directed. 10/10/19   Fulp, Cammie, MD  metFORMIN (GLUCOPHAGE) 500 MG tablet Take 1 tablet (500 mg total) by mouth 2 (two) times daily with a meal. 07/27/19   Fulp, Cammie, MD  prochlorperazine (COMPAZINE) 10 MG tablet Take 1 tablet (10 mg total) by mouth 2 (two) times daily as needed for nausea. 03/05/23   Derwood Kaplan, MD  TRUEplus Lancets 28G MISC 1 each by Does not apply route 2 (two) times a day. 03/22/19   Anders Simmonds, PA-C  sodium chloride (OCEAN) 0.65 % SOLN nasal spray Place 1 spray into both nostrils as needed for congestion. Patient not taking: Reported on 03/13/2019 07/24/18 03/13/19  Leretha Dykes, PA-C      Allergies    Patient has no known allergies.    Review of Systems   Review of Systems  All other systems reviewed and are negative.   Physical Exam Updated Vital Signs BP 122/68   Pulse (!) 115   Temp 98.8 F (37.1 C) (Oral)   Resp (!) 0  Ht 5\' 6"  (1.676 m)   Wt (!) 145.2 kg   LMP 02/02/2023   SpO2 98%   BMI 51.65 kg/m  Physical Exam Vitals and nursing note reviewed.  Constitutional:      Appearance: She is well-developed.  HENT:     Head: Atraumatic.  Cardiovascular:     Rate and Rhythm: Normal rate.  Pulmonary:     Effort: Pulmonary effort is normal.  Abdominal:     General: There is no distension.     Palpations: Abdomen is soft.     Tenderness: There is generalized abdominal tenderness. There is no guarding or rebound.     Hernia: No hernia is present.  Musculoskeletal:     Cervical back: Normal range of motion and neck supple.  Skin:    General:  Skin is warm and dry.  Neurological:     Mental Status: She is alert and oriented to person, place, and time.     ED Results / Procedures / Treatments   Labs (all labs ordered are listed, but only abnormal results are displayed) Labs Reviewed  COMPREHENSIVE METABOLIC PANEL - Abnormal; Notable for the following components:      Result Value   CO2 17 (*)    Glucose, Bld 251 (*)    Albumin 3.4 (*)    All other components within normal limits  CBC WITH DIFFERENTIAL/PLATELET  LIPASE, BLOOD  TROPONIN I (HIGH SENSITIVITY)    EKG None  Radiology DG Chest Port 1 View  Result Date: 03/05/2023 CLINICAL DATA:  Chest pain. EXAM: PORTABLE CHEST 1 VIEW COMPARISON:  March 04, 2023 FINDINGS: Cardiomediastinal silhouette is normal. Mediastinal contours appear intact. There is no evidence of focal airspace consolidation, pleural effusion or pneumothorax. Osseous structures are without acute abnormality. Soft tissues are grossly normal. IMPRESSION: No active disease. Electronically Signed   By: Ted Mcalpine M.D.   On: 03/05/2023 15:11   DG Chest 2 View  Result Date: 03/04/2023 CLINICAL DATA:  Chest pain, tachycardia. EXAM: CHEST - 2 VIEW COMPARISON:  Chest radiograph dated February 03, 2022 FINDINGS: The heart size and mediastinal contours are within normal limits. Low lung volumes without evidence of focal consolidation or pleural effusion. The visualized skeletal structures are unremarkable. IMPRESSION: Low lung volumes without evidence of acute cardiopulmonary process. Electronically Signed   By: Larose Hires D.O.   On: 03/04/2023 22:01    Procedures Procedures    Medications Ordered in ED Medications  alum & mag hydroxide-simeth (MAALOX/MYLANTA) 200-200-20 MG/5ML suspension 30 mL (has no administration in time range)  pantoprazole (PROTONIX) injection 40 mg (40 mg Intravenous Given 03/05/23 1450)  haloperidol lactate (HALDOL) injection 5 mg (5 mg Intravenous Given 03/05/23 1449)    ED  Course/ Medical Decision Making/ A&P                             Medical Decision Making Amount and/or Complexity of Data Reviewed Labs: ordered. Radiology: ordered.  Risk OTC drugs. Prescription drug management.  This patient presents to the ED with chief complaint(s) of generalized abdominal pain, worse over the upper quadrants with pertinent past medical history of diabetes, cholecystectomy, CKD and recent initiation of Ozempic, with escalation in dose last week.The complaint involves an extensive differential diagnosis and also carries with it a high risk of complications and morbidity.    The differential diagnosis includes : Diabetic gastroparesis, gastroparesis as a side effect of Ozempic, ileus, small bowel obstruction, perforated viscus,  PUD, esophageal spasms.  The initial plan is to get basic labs, x-ray of the chest and also give patient Haldol for symptom relief.  Additional history obtained: Records reviewed : Records from the visit to the ER yesterday including medications provided for symptom relief, EKG  Independent labs interpretation:  The following labs were independently interpreted: Patient's CBC, CMP and lipase are normal.  High sensitive troponin ordered to ensure there was not any concerns for atypical ACS presentation and it is reassuring.  Independent visualization and interpretation of imaging: - I independently visualized the following imaging with scope of interpretation limited to determining acute life threatening conditions related to emergency care: X-ray of the chest, which revealed no evidence of free air  Treatment and Reassessment: Patient reassessed.  She feels a lot better.  Pain has subsided significantly.  She has received water and was able to keep it down.  Discussed with the patient that my suspicion is most likely in this case that her symptoms are because of Ozempic side effects.  Patient will be calling her PCP, who prescribed a medicine  to see if she can discontinue it safely.  The patient appears reasonably screened and/or stabilized for discharge and I doubt any other medical condition or other [facility] requiring further screening, evaluation, or treatment in the ED at this time prior to discharge.   Results from the ER workup discussed with the patient face to face and all questions answered to the best of my ability. The patient is safe for discharge with strict return precautions.    Final Clinical Impression(s) / ED Diagnoses Final diagnoses:  Medication side effect, initial encounter    Rx / DC Orders ED Discharge Orders          Ordered    prochlorperazine (COMPAZINE) 10 MG tablet  2 times daily PRN        03/05/23 1653              Derwood Kaplan, MD 03/05/23 1815

## 2023-03-05 NOTE — ED Notes (Signed)
Patient asked to be sat up, RN assisted patient to reposition in bed. Patient reported nausea and RN instructed patient to take slow deep breaths. Patient reported that she could not take slow deep breaths and she did not know why and she just felt bad, patient has been tachypnic throughout encounter. Patient now sitting up, tachypnea resolved.

## 2023-03-05 NOTE — ED Notes (Signed)
Patient stated vomiting in waiting room, patient brought back to triage and medicated. Patient requesting medication for anxiety. Educated patient unable to administer those medications without MD order. Patient placed back in waiting room.

## 2023-03-05 NOTE — ED Triage Notes (Signed)
Pt arrived POV from home c/o abdominal pain and N/V. Pt states she was discharged at 4am this morning with meds but told to come back if it got worse and she cannot take the pain. Pt is begging for help in triage.

## 2023-03-06 ENCOUNTER — Encounter (HOSPITAL_COMMUNITY): Payer: Self-pay

## 2023-03-06 ENCOUNTER — Emergency Department (HOSPITAL_COMMUNITY)
Admission: EM | Admit: 2023-03-06 | Discharge: 2023-03-06 | Disposition: A | Discharge status: Home / Self Care | Payer: Medicaid Other | Source: Home / Self Care | Attending: Emergency Medicine | Admitting: Emergency Medicine

## 2023-03-06 ENCOUNTER — Emergency Department (HOSPITAL_COMMUNITY): Payer: Medicaid Other

## 2023-03-06 DIAGNOSIS — Z794 Long term (current) use of insulin: Secondary | ICD-10-CM | POA: Diagnosis not present

## 2023-03-06 DIAGNOSIS — R1084 Generalized abdominal pain: Secondary | ICD-10-CM | POA: Insufficient documentation

## 2023-03-06 DIAGNOSIS — Z7984 Long term (current) use of oral hypoglycemic drugs: Secondary | ICD-10-CM | POA: Insufficient documentation

## 2023-03-06 DIAGNOSIS — R112 Nausea with vomiting, unspecified: Secondary | ICD-10-CM | POA: Insufficient documentation

## 2023-03-06 DIAGNOSIS — E119 Type 2 diabetes mellitus without complications: Secondary | ICD-10-CM | POA: Diagnosis not present

## 2023-03-06 DIAGNOSIS — R197 Diarrhea, unspecified: Secondary | ICD-10-CM | POA: Diagnosis not present

## 2023-03-06 DIAGNOSIS — D489 Neoplasm of uncertain behavior, unspecified: Secondary | ICD-10-CM | POA: Diagnosis not present

## 2023-03-06 LAB — CBC WITH DIFFERENTIAL/PLATELET
Abs Immature Granulocytes: 0.04 10*3/uL (ref 0.00–0.07)
Basophils Absolute: 0 10*3/uL (ref 0.0–0.1)
Basophils Relative: 0 %
Eosinophils Absolute: 0.1 10*3/uL (ref 0.0–0.5)
Eosinophils Relative: 1 %
HCT: 43.8 % (ref 36.0–46.0)
Hemoglobin: 14 g/dL (ref 12.0–15.0)
Immature Granulocytes: 0 %
Lymphocytes Relative: 18 %
Lymphs Abs: 1.9 10*3/uL (ref 0.7–4.0)
MCH: 28.5 pg (ref 26.0–34.0)
MCHC: 32 g/dL (ref 30.0–36.0)
MCV: 89 fL (ref 80.0–100.0)
Monocytes Absolute: 0.8 10*3/uL (ref 0.1–1.0)
Monocytes Relative: 7 %
Neutro Abs: 7.8 10*3/uL — ABNORMAL HIGH (ref 1.7–7.7)
Neutrophils Relative %: 74 %
Platelets: 226 10*3/uL (ref 150–400)
RBC: 4.92 MIL/uL (ref 3.87–5.11)
RDW: 14 % (ref 11.5–15.5)
WBC: 10.6 10*3/uL — ABNORMAL HIGH (ref 4.0–10.5)
nRBC: 0 % (ref 0.0–0.2)

## 2023-03-06 LAB — COMPREHENSIVE METABOLIC PANEL
ALT: 19 U/L (ref 0–44)
AST: 24 U/L (ref 15–41)
Albumin: 3.5 g/dL (ref 3.5–5.0)
Alkaline Phosphatase: 64 U/L (ref 38–126)
Anion gap: 16 — ABNORMAL HIGH (ref 5–15)
BUN: 6 mg/dL (ref 6–20)
CO2: 17 mmol/L — ABNORMAL LOW (ref 22–32)
Calcium: 8.8 mg/dL — ABNORMAL LOW (ref 8.9–10.3)
Chloride: 103 mmol/L (ref 98–111)
Creatinine, Ser: 0.83 mg/dL (ref 0.44–1.00)
GFR, Estimated: >60 mL/min (ref >60)
Glucose, Bld: 268 mg/dL — ABNORMAL HIGH (ref 70–99)
Potassium: 3.6 mmol/L (ref 3.5–5.1)
Sodium: 136 mmol/L (ref 135–145)
Total Bilirubin: 1.4 mg/dL — ABNORMAL HIGH (ref 0.3–1.2)
Total Protein: 7 g/dL (ref 6.5–8.1)

## 2023-03-06 LAB — LIPASE, BLOOD: Lipase: 59 U/L — ABNORMAL HIGH (ref 11–51)

## 2023-03-06 LAB — HCG, SERUM, QUALITATIVE: Preg, Serum: NEGATIVE

## 2023-03-06 LAB — CBG MONITORING, ED: Glucose-Capillary: 258 mg/dL — ABNORMAL HIGH (ref 70–99)

## 2023-03-06 MED ORDER — IOHEXOL 350 MG/ML SOLN
75.0000 mL | Freq: Once | INTRAVENOUS | Status: AC | PRN
  Administered 2023-03-06: 75 mL via INTRAVENOUS

## 2023-03-06 MED ORDER — LORAZEPAM 2 MG/ML IJ SOLN
1.0000 mg | Freq: Once | INTRAMUSCULAR | Status: AC
  Administered 2023-03-06: 1 mg via INTRAVENOUS
  Filled 2023-03-06: qty 1

## 2023-03-06 MED ORDER — LIDOCAINE VISCOUS HCL 2 % MT SOLN
15.0000 mL | Freq: Once | OROMUCOSAL | Status: AC
  Administered 2023-03-06: 15 mL via ORAL
  Filled 2023-03-06: qty 15

## 2023-03-06 MED ORDER — PANTOPRAZOLE SODIUM 40 MG IV SOLR
40.0000 mg | Freq: Once | INTRAVENOUS | Status: AC
  Administered 2023-03-06: 40 mg via INTRAVENOUS
  Filled 2023-03-06: qty 10

## 2023-03-06 MED ORDER — METOCLOPRAMIDE HCL 10 MG PO TABS: 10.0000 mg | ORAL_TABLET | Freq: Four times a day (QID) | ORAL | 0 refills | Status: AC

## 2023-03-06 MED ORDER — ALUM & MAG HYDROXIDE-SIMETH 200-200-20 MG/5ML PO SUSP
30.0000 mL | Freq: Once | ORAL | Status: AC
  Administered 2023-03-06: 30 mL via ORAL
  Filled 2023-03-06: qty 30

## 2023-03-06 MED ORDER — HALOPERIDOL LACTATE 5 MG/ML IJ SOLN
5.0000 mg | Freq: Once | INTRAMUSCULAR | Status: DC
  Filled 2023-03-06: qty 1

## 2023-03-06 MED ORDER — DIPHENHYDRAMINE HCL 50 MG/ML IJ SOLN
25.0000 mg | Freq: Once | INTRAMUSCULAR | Status: DC
  Filled 2023-03-06: qty 1

## 2023-03-06 MED ORDER — HALOPERIDOL LACTATE 5 MG/ML IJ SOLN
5.0000 mg | Freq: Once | INTRAMUSCULAR | Status: AC
  Administered 2023-03-06: 5 mg via INTRAVENOUS

## 2023-03-06 NOTE — ED Provider Notes (Signed)
[place] [facility] DEPARTMENT AT [facility] Provider Note   CSN: [id number] Arrival date & time: 03/06/23  0847     History  Chief Complaint  Patient presents with   Abdominal Pain    Maria Morris is a 42 y.o. female history of type 2 diabetes, obesity, AKI, sepsis, fibromyalgia presented with abdominal pain has been present for the past few days.  Patient states that Maria Morris has been having nausea vomiting and diarrhea and has not been able to eat over the past 3 days.  Patient states that Maria Morris recently started Ozempic and has been seen multiple times in the past 2 days for the same symptoms but that her symptoms resolve after Maria Morris is treated medically here.  Patient denied chest pain, shortness of breath, change in sensation/motor skills, vaginal bleeding, pelvic pain, hematuria, flank pain, fevers  Home Medications Prior to Admission medications   Medication Sig Start Date End Date Taking? Authorizing Provider  metoCLOPramide (REGLAN) 10 MG tablet Take 1 tablet (10 mg total) by mouth every 6 (six) hours. 03/06/23  Yes Evlyn Kanner T, PA-C  atorvastatin (LIPITOR) 20 MG tablet Take 20 mg by mouth daily. 07/30/13   [provider]  Blood Glucose Monitoring Suppl (TRUE METRIX METER) w/Device KIT 1 each by Does not apply route 2 (two) times a day. 03/22/19   Anders Simmonds, PA-C  buPROPion (WELLBUTRIN XL) 150 MG 24 hr tablet Take 150 mg by mouth every morning. 09/07/21   [provider]  Continuous Blood Gluc Receiver (DEXCOM G6 RECEIVER) DEVI  09/10/20   [provider]  Continuous Blood Gluc Sensor (DEXCOM G6 SENSOR) MISC Inject 1 Device into the skin every 14 (fourteen) days. 03/04/20   [provider]  Continuous Blood Gluc Transmit (DEXCOM G6 TRANSMITTER) MISC See admin instructions. 03/11/20   [provider]  DULoxetine (CYMBALTA) 30 MG capsule Take 1 capsule (30 mg total) by mouth daily. 09/25/21   Patel, Roxana Hires K, DO   gabapentin (NEURONTIN) 800 MG tablet Take 1 tablet (800 mg total) by mouth in the morning, at noon, and at bedtime. 07/30/21   Stover, Cassandria Anger, DPM  glucose blood (TRUE METRIX BLOOD GLUCOSE TEST) test strip Use as instructed Patient taking differently: 1 each by Other route as directed. 03/22/19   Anders Simmonds, PA-C  Insulin Pen Needle (TRUEPLUS PEN NEEDLES) 32G X 4 MM MISC Use as instructed. Patient taking differently: 1 each by Other route as directed. 10/10/19   Fulp, Cammie, MD  metFORMIN (GLUCOPHAGE) 500 MG tablet Take 1 tablet (500 mg total) by mouth 2 (two) times daily with a meal. 07/27/19   Fulp, Cammie, MD  prochlorperazine (COMPAZINE) 10 MG tablet Take 1 tablet (10 mg total) by mouth 2 (two) times daily as needed for nausea. 03/05/23   Derwood Kaplan, MD  TRUEplus Lancets 28G MISC 1 each by Does not apply route 2 (two) times a day. 03/22/19   Anders Simmonds, PA-C  sodium chloride (OCEAN) 0.65 % SOLN nasal spray Place 1 spray into both nostrils as needed for congestion. Patient not taking: Reported on 03/13/2019 07/24/18 03/13/19  Leretha Dykes, PA-C      Allergies    Patient has no known allergies.    Review of Systems   Review of Systems  Gastrointestinal:  Positive for abdominal pain.    Physical Exam Updated Vital Signs BP 100/66   Pulse 97   Temp 98.8 F (37.1 C) (Oral)   Resp (!) 21  Ht 5\' 6"  (1.676 m)   Wt (!) 145 kg   LMP 02/02/2023   SpO2 100%   BMI 51.60 kg/m  Physical Exam Vitals reviewed.  Constitutional:      General: Maria Morris is not in acute distress. HENT:     Head: Normocephalic and atraumatic.  Eyes:     Extraocular Movements: Extraocular movements intact.     Conjunctiva/sclera: Conjunctivae normal.     Pupils: Pupils are equal, round, and reactive to light.  Cardiovascular:     Rate and Rhythm: Normal rate and regular rhythm.     Pulses: Normal pulses.     Heart sounds: Normal heart sounds.     Comments: 2+ bilateral radial/dorsalis pedis  pulses with regular rate Pulmonary:     Effort: Pulmonary effort is normal. No respiratory distress.     Breath sounds: Normal breath sounds.  Abdominal:     Palpations: Abdomen is soft.     Tenderness: There is generalized abdominal tenderness. There is no guarding or rebound.  Musculoskeletal:        General: Normal range of motion.     Cervical back: Normal range of motion and neck supple.     Comments: 5 out of 5 bilateral grip/leg extension strength  Skin:    General: Skin is warm and dry.     Capillary Refill: Capillary refill takes less than 2 seconds.  Neurological:     General: No focal deficit present.     Mental Status: Maria Morris is alert and oriented to person, place, and time.     Comments: Sensation intact in all 4 limbs  Psychiatric:        Mood and Affect: Mood normal.     ED Results / Procedures / Treatments   Labs (all labs ordered are listed, but only abnormal results are displayed) Labs Reviewed  COMPREHENSIVE METABOLIC PANEL - Abnormal; Notable for the following components:      Result Value   CO2 17 (*)    Glucose, Bld 268 (*)    Calcium 8.8 (*)    Total Bilirubin 1.4 (*)    Anion gap 16 (*)    All other components within normal limits  LIPASE, BLOOD - Abnormal; Notable for the following components:   Lipase 59 (*)    All other components within normal limits  CBC WITH DIFFERENTIAL/PLATELET - Abnormal; Notable for the following components:   WBC 10.6 (*)    Neutro Abs 7.8 (*)    All other components within normal limits  CBG MONITORING, ED - Abnormal; Notable for the following components:   Glucose-Capillary 258 (*)    All other components within normal limits  HCG, SERUM, QUALITATIVE    EKG None  Radiology CT ABDOMEN PELVIS W CONTRAST  Result Date: 03/06/2023 CLINICAL DATA:  Abdominal pain EXAM: CT ABDOMEN AND PELVIS WITH CONTRAST TECHNIQUE: Multidetector CT imaging of the abdomen and pelvis was performed using the standard protocol following  bolus administration of intravenous contrast. RADIATION DOSE REDUCTION: This exam was performed according to the departmental dose-optimization program which includes automated exposure control, adjustment of the mA and/or kV according to patient size and/or use of iterative reconstruction technique. CONTRAST:  75mL OMNIPAQUE IOHEXOL 350 MG/ML SOLN COMPARISON:  02/03/2022 FINDINGS: Lower chest: Visualized lower lung fields are clear. Hepatobiliary: Liver measures 22.5 cm in length. There is slightly decreased density in liver in comparison to the spleen. There is no dilation of bile ducts. Gallbladder is not seen. Pancreas: Unremarkable. Spleen: There are  calcified granulomas in spleen. Adrenals/Urinary Tract: Adrenals are unremarkable. There is no hydronephrosis. There are no renal or ureteral stones. Urinary bladder is not distended. Stomach/Bowel: Small hiatal hernia is seen. Stomach is unremarkable. Small bowel loops are not dilated. Appendix is not dilated. There is no significant wall thickening in colon. Few diverticula are seen in colon without signs of focal diverticulitis. Vascular/Lymphatic: Unremarkable. Reproductive: There is 6.2 x 5.2 cm mostly fatty smooth marginated lesion adjacent to the upper margin of the uterus with no interval change. There is small calcification in the margin of the lesion. Findings suggest teratoma. Other: There is no ascites or pneumoperitoneum. Musculoskeletal: No acute findings are seen. IMPRESSION: There is no evidence of intestinal obstruction or pneumoperitoneum. There is no hydronephrosis. Appendix is not dilated. Enlarged liver. Small hiatal hernia. 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 This finding appears stable. Electronically Signed   By: Ernie Avena M.D.   On: 03/06/2023 11:20   DG Chest Port 1 View  Result Date: 03/05/2023 CLINICAL DATA:  Chest pain. EXAM: PORTABLE CHEST 1 VIEW COMPARISON:  March 04, 2023  FINDINGS: Cardiomediastinal silhouette is normal. Mediastinal contours appear intact. There is no evidence of focal airspace consolidation, pleural effusion or pneumothorax. Osseous structures are without acute abnormality. Soft tissues are grossly normal. IMPRESSION: No active disease. Electronically Signed   By: Ted Mcalpine M.D.   On: 03/05/2023 15:11   DG Chest 2 View  Result Date: 03/04/2023 CLINICAL DATA:  Chest pain, tachycardia. EXAM: CHEST - 2 VIEW COMPARISON:  Chest radiograph dated February 03, 2022 FINDINGS: The heart size and mediastinal contours are within normal limits. Low lung volumes without evidence of focal consolidation or pleural effusion. The visualized skeletal structures are unremarkable. IMPRESSION: Low lung volumes without evidence of acute cardiopulmonary process. Electronically Signed   By: Larose Hires D.O.   On: 03/04/2023 22:01    Procedures Procedures    Medications Ordered in ED Medications  diphenhydrAMINE (BENADRYL) injection 25 mg (has no administration in time range)  alum & mag hydroxide-simeth (MAALOX/MYLANTA) 200-200-20 MG/5ML suspension 30 mL (30 mLs Oral Given 03/06/23 0943)    And  lidocaine (XYLOCAINE) 2 % viscous mouth solution 15 mL (15 mLs Oral Given 03/06/23 0943)  pantoprazole (PROTONIX) injection 40 mg (40 mg Intravenous Given 03/06/23 0944)  haloperidol lactate (HALDOL) injection 5 mg (5 mg Intravenous Given 03/06/23 0945)  LORazepam (ATIVAN) injection 1 mg (1 mg Intravenous Given 03/06/23 1019)  iohexol (OMNIPAQUE) 350 MG/ML injection 75 mL (75 mLs Intravenous Contrast Given 03/06/23 1104)    ED Course/ Medical Decision Making/ A&P                             Medical Decision Making Amount and/or Complexity of Data Reviewed Labs: ordered. Radiology: ordered.  Risk OTC drugs. Prescription drug management.   [name] 42 y.o. presented today for abdominal pain with nausea vomiting. Working DDx that I considered at this  time includes, but not limited to, secondary to medication use, gastroenteritis, colitis, small bowel obstruction, appendicitis, cholecystitis, pancreatitis, nephrolithiasis, AAA, UTI, pyelonephritis, ruptured ectopic pregnancy, PID, ovarian torsion.  R/o DDx: gastroenteritis, colitis, small bowel obstruction, appendicitis, cholecystitis, pancreatitis, nephrolithiasis, AAA, UTI, pyelonephritis, ruptured ectopic pregnancy, PID, ovarian torsion: These are considered less likely due to history of present illness and physical exam findings.  Review of prior external notes: 03/05/2023  Unique Tests and My Interpretation:  CBC  with differential: Unremarkable CMP: Unremarkable Lipase: Unremarkable hCG qualitative: Negative EKG: Rate, rhythm, axis, intervals all examined and without medically relevant abnormality. ST segments without concerns for elevations CT Abd/Pelvis with contrast: No acute findings noted, teratoma noted that is stable  Discussion with Independent Historian:  Husband  Discussion of Management of Tests: None  Risk: Medium: prescription drug management  Risk Stratification Score: None  Plan: On exam patient was in no acute distress stable vitals.  Patient was tender to palpation of her abdomen however did not have any peritoneal signs.  Patient has been seen twice in the past 2 days for the same chief concern without a CT scan so CT scan was ordered.  Labs will be reordered as well.  Patient was given Haldol, GI cocktail as Maria Morris tolerated this yesterday along with Protonix.  Patient was also given Ativan for her anxiety and Maria Morris was very anxious.  Patient did not show any signs of dystonic reaction after receiving Haldol.  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  Patient stable for discharge at  this time and encouraged to follow-up with her primary care provider.  Patient was given return precautions. Patient stable for discharge at this time.  Patient verbalized understanding of plan.         Final Clinical Impression(s) / ED Diagnoses Final diagnoses:  Generalized abdominal pain  Teratoma    Rx / DC Orders ED Discharge Orders          Ordered    metoCLOPramide (REGLAN) 10 MG tablet  Every 6 hours        03/06/23 1147              Remi Deter 03/06/23 1150    Wynetta Fines, MD 03/06/23 1359

## 2023-03-06 NOTE — ED Triage Notes (Signed)
Pt was just seen here yesterday for the same complaints. Here today for abdominal pain, nausea, vomiting, diarrhea for a few weeks now since pt started taking Ozempic. Hx of fibromyalgia and complains of generalized aches. Unable to tolerate PO at home.

## 2023-03-06 NOTE — ED Notes (Signed)
Pt is currently very anxious and has been pacing and rocking while on bed. Pt has called staff several times for different reasons. Pt is now complaining of numbness to her mouth. RN reassured pt that lidocaine that was given previously with maalox causes numbness to mouth. Pt continues to be anxious. Provider came at bedside and ordered ativan IVP. Given to pt at this time. Will continue to monitor.

## 2023-03-06 NOTE — Discharge Instructions (Signed)
Please follow-up with your primary care provider recent symptoms and ER visit.  Today your labs and imaging were reassuring and your symptoms are most likely secondary to your Ozempic.  I have prescribed you Reglan to take for your nausea and vomiting which should help with your belly pain.  Please continue take your medications as prescribed and if symptoms change or worsen return to ER.

## 2023-03-07 ENCOUNTER — Emergency Department (HOSPITAL_COMMUNITY)
Admission: EM | Admit: 2023-03-07 | Discharge: 2023-03-07 | Disposition: A | Discharge status: Home / Self Care | Payer: Medicaid Other | Source: Home / Self Care | Attending: Emergency Medicine | Admitting: Emergency Medicine

## 2023-03-07 ENCOUNTER — Other Ambulatory Visit: Payer: Self-pay

## 2023-03-07 ENCOUNTER — Encounter (HOSPITAL_COMMUNITY): Payer: Self-pay

## 2023-03-07 DIAGNOSIS — R197 Diarrhea, unspecified: Secondary | ICD-10-CM | POA: Insufficient documentation

## 2023-03-07 DIAGNOSIS — R112 Nausea with vomiting, unspecified: Secondary | ICD-10-CM | POA: Diagnosis not present

## 2023-03-07 DIAGNOSIS — E119 Type 2 diabetes mellitus without complications: Secondary | ICD-10-CM | POA: Diagnosis not present

## 2023-03-07 DIAGNOSIS — R1013 Epigastric pain: Secondary | ICD-10-CM | POA: Insufficient documentation

## 2023-03-07 DIAGNOSIS — Z7984 Long term (current) use of oral hypoglycemic drugs: Secondary | ICD-10-CM | POA: Insufficient documentation

## 2023-03-07 DIAGNOSIS — Z794 Long term (current) use of insulin: Secondary | ICD-10-CM | POA: Diagnosis not present

## 2023-03-07 LAB — COMPREHENSIVE METABOLIC PANEL
ALT: 21 U/L (ref 0–44)
AST: 29 U/L (ref 15–41)
Albumin: 3.6 g/dL (ref 3.5–5.0)
Alkaline Phosphatase: 62 U/L (ref 38–126)
Anion gap: 12 (ref 5–15)
BUN: 7 mg/dL (ref 6–20)
CO2: 19 mmol/L — ABNORMAL LOW (ref 22–32)
Calcium: 8.8 mg/dL — ABNORMAL LOW (ref 8.9–10.3)
Chloride: 106 mmol/L (ref 98–111)
Creatinine, Ser: 0.79 mg/dL (ref 0.44–1.00)
GFR, Estimated: >60 mL/min (ref >60)
Glucose, Bld: 205 mg/dL — ABNORMAL HIGH (ref 70–99)
Potassium: 3.1 mmol/L — ABNORMAL LOW (ref 3.5–5.1)
Sodium: 137 mmol/L (ref 135–145)
Total Bilirubin: 1.1 mg/dL (ref 0.3–1.2)
Total Protein: 7 g/dL (ref 6.5–8.1)

## 2023-03-07 LAB — URINALYSIS, ROUTINE W REFLEX MICROSCOPIC
Bilirubin Urine: NEGATIVE
Glucose, UA: NEGATIVE mg/dL
Hgb urine dipstick: NEGATIVE
Ketones, ur: 80 mg/dL — AB
Leukocytes,Ua: NEGATIVE
Nitrite: NEGATIVE
Protein, ur: 100 mg/dL — AB
Specific Gravity, Urine: 1.027 (ref 1.005–1.030)
pH: 5 (ref 5.0–8.0)

## 2023-03-07 LAB — CBC
HCT: 41.9 % (ref 36.0–46.0)
Hemoglobin: 13.9 g/dL (ref 12.0–15.0)
MCH: 28.8 pg (ref 26.0–34.0)
MCHC: 33.2 g/dL (ref 30.0–36.0)
MCV: 86.7 fL (ref 80.0–100.0)
Platelets: 234 10*3/uL (ref 150–400)
RBC: 4.83 MIL/uL (ref 3.87–5.11)
RDW: 13.8 % (ref 11.5–15.5)
WBC: 10.6 10*3/uL — ABNORMAL HIGH (ref 4.0–10.5)
nRBC: 0 % (ref 0.0–0.2)

## 2023-03-07 LAB — HCG, SERUM, QUALITATIVE: Preg, Serum: NEGATIVE

## 2023-03-07 LAB — LIPASE, BLOOD: Lipase: 47 U/L (ref 11–51)

## 2023-03-07 MED ORDER — ONDANSETRON 4 MG PO TBDP: 4.0000 mg | ORAL_TABLET | Freq: Three times a day (TID) | ORAL | 0 refills | Status: AC | PRN

## 2023-03-07 MED ORDER — ONDANSETRON 4 MG PO TBDP
4.0000 mg | ORAL_TABLET | Freq: Once | ORAL | Status: AC
  Administered 2023-03-07: 4 mg via ORAL
  Filled 2023-03-07: qty 1

## 2023-03-07 NOTE — ED Provider Triage Note (Signed)
Emergency Medicine Provider Triage Evaluation Note  Maria Morris , a 42 y.o. female  was evaluated in triage.  Pt complains of abdominal pain with nausea and vomiting.  Patient's been seen here multiple times in the past few days for similar symptoms.  Reports that she thinks is from her Ozempic.  No new symptoms.  Review of Systems  Positive:  Negative:   Physical Exam  BP 111/86   Pulse (!) 102   Temp 98.8 F (37.1 C) (Oral)   Resp 20   Ht 5\' 6"  (1.676 m)   Wt (!) 145 kg   LMP 02/02/2023   SpO2 99%   BMI 51.60 kg/m  Gen:   Awake, tearful Resp:  Normal effort  MSK:   Moves extremities without difficulty  Other:  Abdominal exam limited secondary to body habitus.  Mild diffuse tenderness to palpation.  Medical Decision Making  Medically screening exam initiated at 1:22 PM.  Appropriate orders placed.  Maria Morris was informed that the remainder of the evaluation will be completed by another provider, this initial triage assessment does not replace that evaluation, and the importance of remaining in the ED until their evaluation is complete.  The patient has been seen here multiple times for the same complaint.  Reports still abdominal pain with nausea and vomiting here.  Recent CT done yesterday.   Achille Rich, PA-C 03/07/23 1323

## 2023-03-07 NOTE — Discharge Instructions (Signed)
You were seen for your nausea and vomiting in the emergency department.  At home, please take the Zofran for your nausea and vomiting. Please be sure to stay well-hydrated.  Follow-up with your primary doctor in 2-3 days regarding your visit.  Return immediately to the emergency department if you experience any of the following: fainting, abdominal pain, high fevers, or any other concerning symptoms.  Thank you for visiting our Emergency Department. It was a pleasure taking care of you today.

## 2023-03-07 NOTE — ED Triage Notes (Addendum)
Pt arrived via [facility] from home for c/o N/V since starting ozempic a month ago. Pt hasn't been able to eat since Thurs. EMS gave zofran 4mg  IV.

## 2023-03-07 NOTE — ED Notes (Signed)
Patient able to tolerate a cup of water and a small diet soda without feeling nauseas or vomiting.

## 2023-03-07 NOTE — ED Notes (Addendum)
Pt reports BM with dark red blood

## 2023-03-07 NOTE — ED Provider Notes (Signed)
[place] EMERGENCY DEPARTMENT AT [facility] Provider Note   CSN: [id number] Arrival date & time: 03/07/23  1248     History  Chief Complaint  Patient presents with   Emesis    Maria Morris is a 42 y.o. female.  42 year old female with a history of cholecystectomy and diabetes on Ozempic who presents emergency department with abdominal pain and nausea and vomiting.  Had Ozempic on Thursday.  On Friday started having 4-5 episodes of nonbloody nonbilious emesis daily.  Has also had multiple episodes of loose stool since then.  No fevers.  Has occasional twinges of abdominal pain in her epigastrium.  History of cholecystectomy.  No other abdominal surgeries.  No marijuana use or alcohol use. Was seen in ED yesterday with similar symptoms and had a CT scan that did not show any acute findings.  Was not given any antiemetics.       Home Medications Prior to Admission medications   Medication Sig Start Date End Date Taking? Authorizing Provider  ondansetron (ZOFRAN-ODT) 4 MG disintegrating tablet Take 1 tablet (4 mg total) by mouth every 8 (eight) hours as needed for nausea or vomiting. 03/07/23  Yes Rondel Baton, MD  atorvastatin (LIPITOR) 20 MG tablet Take 20 mg by mouth daily. 07/30/13   [provider]  Blood Glucose Monitoring Suppl (TRUE METRIX METER) w/Device KIT 1 each by Does not apply route 2 (two) times a day. 03/22/19   Anders Simmonds, PA-C  buPROPion (WELLBUTRIN XL) 150 MG 24 hr tablet Take 150 mg by mouth every morning. 09/07/21   [provider]  Continuous Blood Gluc Receiver (DEXCOM G6 RECEIVER) DEVI  09/10/20   [provider]  Continuous Blood Gluc Sensor (DEXCOM G6 SENSOR) MISC Inject 1 Device into the skin every 14 (fourteen) days. 03/04/20   [provider]  Continuous Blood Gluc Transmit (DEXCOM G6 TRANSMITTER) MISC See admin instructions. 03/11/20   [provider]  DULoxetine (CYMBALTA) 30 MG  capsule Take 1 capsule (30 mg total) by mouth daily. 09/25/21   Patel, Roxana Hires K, DO  gabapentin (NEURONTIN) 800 MG tablet Take 1 tablet (800 mg total) by mouth in the morning, at noon, and at bedtime. 07/30/21   Stover, Cassandria Anger, DPM  glucose blood (TRUE METRIX BLOOD GLUCOSE TEST) test strip Use as instructed Patient taking differently: 1 each by Other route as directed. 03/22/19   Anders Simmonds, PA-C  Insulin Pen Needle (TRUEPLUS PEN NEEDLES) 32G X 4 MM MISC Use as instructed. Patient taking differently: 1 each by Other route as directed. 10/10/19   Fulp, Cammie, MD  metFORMIN (GLUCOPHAGE) 500 MG tablet Take 1 tablet (500 mg total) by mouth 2 (two) times daily with a meal. 07/27/19   Fulp, Cammie, MD  metoCLOPramide (REGLAN) 10 MG tablet Take 1 tablet (10 mg total) by mouth every 6 (six) hours. 03/06/23   Netta Corrigan, PA-C  prochlorperazine (COMPAZINE) 10 MG tablet Take 1 tablet (10 mg total) by mouth 2 (two) times daily as needed for nausea. 03/05/23   Derwood Kaplan, MD  TRUEplus Lancets 28G MISC 1 each by Does not apply route 2 (two) times a day. 03/22/19   Anders Simmonds, PA-C  sodium chloride (OCEAN) 0.65 % SOLN nasal spray Place 1 spray into both nostrils as needed for congestion. Patient not taking: Reported on 03/13/2019 07/24/18 03/13/19  Leretha Dykes, PA-C      Allergies    Patient has no known allergies.  Review of Systems   Review of Systems  Physical Exam Updated Vital Signs BP (!) 111/52 (BP Location: Right Arm)   Pulse 91   Temp 98.3 F (36.8 C) (Oral)   Resp 20   Ht 5\' 6"  (1.676 m)   Wt (!) 145 kg   LMP 02/02/2023   SpO2 98%   BMI 51.60 kg/m  Physical Exam Vitals and nursing note reviewed.  Constitutional:      General: She is not in acute distress.    Appearance: She is well-developed.  HENT:     Head: Normocephalic and atraumatic.     Right Ear: External ear normal.     Left Ear: External ear normal.     Nose: Nose normal.  Eyes:     Extraocular  Movements: Extraocular movements intact.     Conjunctiva/sclera: Conjunctivae normal.     Pupils: Pupils are equal, round, and reactive to light.  Cardiovascular:     Rate and Rhythm: Normal rate and regular rhythm.     Heart sounds: No murmur heard. Pulmonary:     Effort: Pulmonary effort is normal. No respiratory distress.     Breath sounds: Normal breath sounds.  Abdominal:     General: Abdomen is flat. There is no distension.     Palpations: Abdomen is soft. There is no mass.     Tenderness: There is abdominal tenderness (Mild epigastric). There is no guarding.  Musculoskeletal:     Cervical back: Normal range of motion and neck supple.  Skin:    General: Skin is warm and dry.  Neurological:     Mental Status: She is alert and oriented to person, place, and time. Mental status is at baseline.  Psychiatric:        Mood and Affect: Mood normal.     ED Results / Procedures / Treatments   Labs (all labs ordered are listed, but only abnormal results are displayed) Labs Reviewed  CBC - Abnormal; Notable for the following components:      Result Value   WBC 10.6 (*)    All other components within normal limits  URINALYSIS, ROUTINE W REFLEX MICROSCOPIC - Abnormal; Notable for the following components:   Color, Urine AMBER (*)    Ketones, ur 80 (*)    Protein, ur 100 (*)    Bacteria, UA FEW (*)    All other components within normal limits  COMPREHENSIVE METABOLIC PANEL - Abnormal; Notable for the following components:   Potassium 3.1 (*)    CO2 19 (*)    Glucose, Bld 205 (*)    Calcium 8.8 (*)    All other components within normal limits  HCG, SERUM, QUALITATIVE  LIPASE, BLOOD    EKG None  Radiology CT ABDOMEN PELVIS W CONTRAST  Result Date: 03/06/2023 CLINICAL DATA:  Abdominal pain EXAM: CT ABDOMEN AND PELVIS WITH CONTRAST TECHNIQUE: Multidetector CT imaging of the abdomen and pelvis was performed using the standard protocol following bolus administration of  intravenous contrast. RADIATION DOSE REDUCTION: This exam was performed according to the departmental dose-optimization program which includes automated exposure control, adjustment of the mA and/or kV according to patient size and/or use of iterative reconstruction technique. CONTRAST:  75mL OMNIPAQUE IOHEXOL 350 MG/ML SOLN COMPARISON:  02/03/2022 FINDINGS: Lower chest: Visualized lower lung fields are clear. Hepatobiliary: Liver measures 22.5 cm in length. There is slightly decreased density in liver in comparison to the spleen. There is no dilation of bile ducts. Gallbladder is not seen. Pancreas: Unremarkable.  Spleen: There are calcified granulomas in spleen. Adrenals/Urinary Tract: Adrenals are unremarkable. There is no hydronephrosis. There are no renal or ureteral stones. Urinary bladder is not distended. Stomach/Bowel: Small hiatal hernia is seen. Stomach is unremarkable. Small bowel loops are not dilated. Appendix is not dilated. There is no significant wall thickening in colon. Few diverticula are seen in colon without signs of focal diverticulitis. Vascular/Lymphatic: Unremarkable. Reproductive: There is 6.2 x 5.2 cm mostly fatty smooth marginated lesion adjacent to the upper margin of the uterus with no interval change. There is small calcification in the margin of the lesion. Findings suggest teratoma. Other: There is no ascites or pneumoperitoneum. Musculoskeletal: No acute findings are seen. IMPRESSION: There is no evidence of intestinal obstruction or pneumoperitoneum. There is no hydronephrosis. Appendix is not dilated. Enlarged liver. Small hiatal hernia. 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 This finding appears stable. Electronically Signed   By: Ernie Avena M.D.   On: 03/06/2023 11:20    Procedures Procedures    Medications Ordered in ED Medications  ondansetron (ZOFRAN-ODT) disintegrating tablet 4 mg (4 mg Oral Given 03/07/23 1936)     ED Course/ Medical Decision Making/ A&P                             Medical Decision Making Amount and/or Complexity of Data Reviewed Labs: ordered.  Risk Prescription drug management.   Maymie Devyn Griffing is a 42 y.o. female with comorbidities that complicate the patient evaluation including cholecystectomy and diabetes on Ozempic who presents emergency department with abdominal pain, nausea, vomiting and diarrhea  Initial Ddx:  Gastroenteritis, Ozempic side effect, electrolyte abnormality, bowel obstruction, cholecystitis, appendicitis  MDM/Course:  Concern the patient may be having some side effects from her Ozempic.  Also the differential would be gastroenteritis.  Does have some mild epigastric tenderness to palpation which I suspect is from her nausea and vomiting.  Did consider intra-abdominal abscess/infection such as appendicitis but is not having any right lower quadrant pain and did have a CT scan yesterday that did not show any signs of bowel obstruction or appendicitis.  Has already had a cholecystectomy.  Was given Zofran in the emergency department upon re-evaluation was feeling much better and able to tolerate p.o.  She was discharged home with Zofran ODT and instructions to follow-up with her primary doctor in several days.  This patient presents to the ED for concern of complaints listed in HPI, this involves an extensive number of treatment options, and is a complaint that carries with it a high risk of complications and morbidity. Disposition including potential need for admission considered.   Dispo: DC Home. Return precautions discussed including, but not limited to, those listed in the AVS. Allowed pt time to ask questions which were answered fully prior to dc.  Additional history obtained from spouse Records reviewed ED Visit Notes The following labs were independently interpreted: Chemistry and show  elevated blood sugar without DKA/HHS I personally  reviewed and interpreted cardiac monitoring: normal sinus rhythm  I personally reviewed and interpreted the pt's EKG: see above for interpretation  I have reviewed the patients home medications and made adjustments as needed        Final Clinical Impression(s) / ED Diagnoses Final diagnoses:  Nausea vomiting and diarrhea    Rx / DC Orders ED Discharge Orders          Ordered  ondansetron (ZOFRAN-ODT) 4 MG disintegrating tablet  Every 8 hours PRN        03/07/23 2123              Rondel Baton, MD 03/07/23 2138
